# Patient Record
Sex: Female | Born: 1985
Health system: Southern US, Community
[De-identification: ages and names within clinical notes are randomized; demographics above are authoritative.]

## PROBLEM LIST (undated history)

## (undated) DIAGNOSIS — E119 Type 2 diabetes mellitus without complications: Secondary | ICD-10-CM

## (undated) HISTORY — DX: Type 2 diabetes mellitus without complications: E11.9

---
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
## 2005-06-15 ENCOUNTER — Other Ambulatory Visit: Admission: RE | Admit: 2005-06-15 | Discharge: 2005-06-15 | Payer: Self-pay | Admitting: Family Medicine

## 2006-12-08 ENCOUNTER — Other Ambulatory Visit: Admission: RE | Admit: 2006-12-08 | Discharge: 2006-12-08 | Payer: Self-pay | Admitting: Family Medicine

## 2009-05-18 ENCOUNTER — Other Ambulatory Visit: Admission: RE | Admit: 2009-05-18 | Discharge: 2009-05-18 | Payer: Self-pay | Admitting: Family Medicine

## 2009-05-28 ENCOUNTER — Encounter: Admission: RE | Admit: 2009-05-28 | Discharge: 2009-05-28 | Payer: Self-pay | Admitting: Family Medicine

## 2009-11-25 ENCOUNTER — Encounter: Admission: RE | Admit: 2009-11-25 | Discharge: 2009-11-25 | Payer: Self-pay | Admitting: Specialist

## 2010-12-26 IMAGING — CR DG SACRUM/COCCYX 2+V
3 series · 3 of 3 positions shown · non-contrast
Comparison: None

CLINICAL DATA: Fall, pain when sitting.

SACRUM AND COCCYX - 2+ VIEW

[view not recorded (1 of 3)]
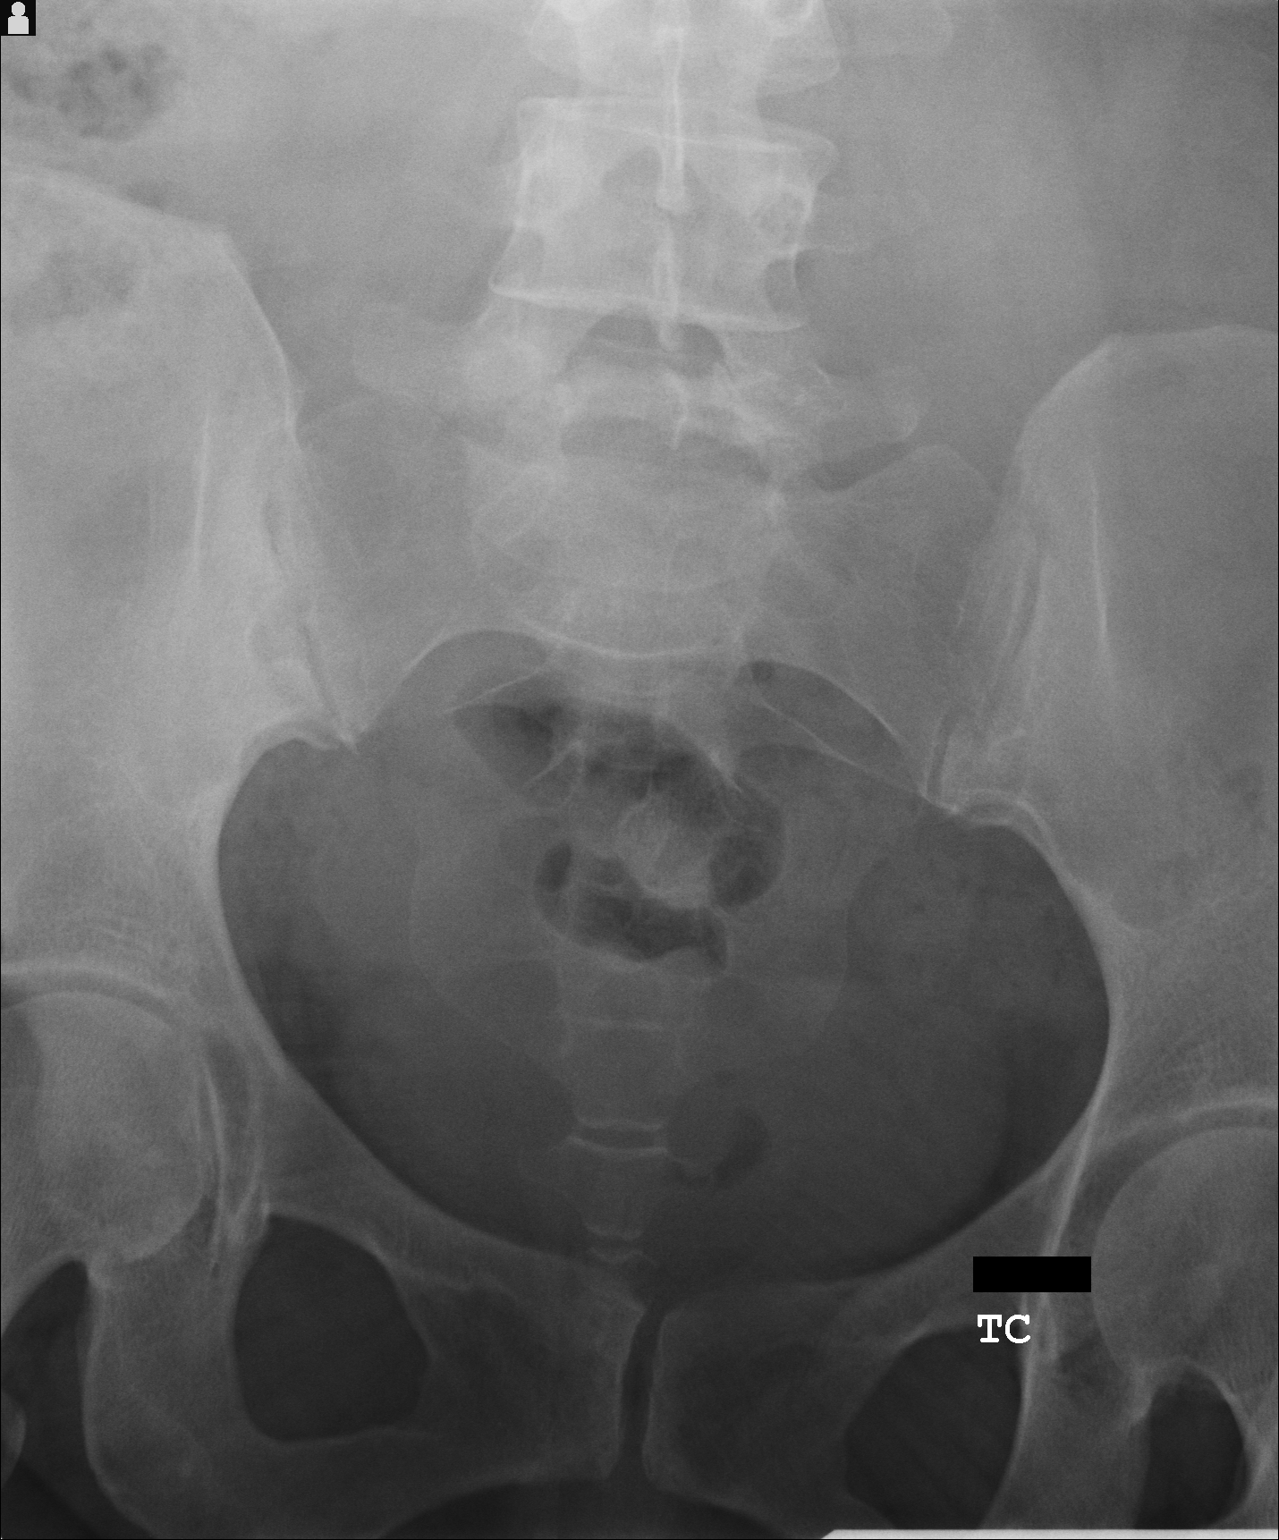

[view not recorded (2 of 3)]
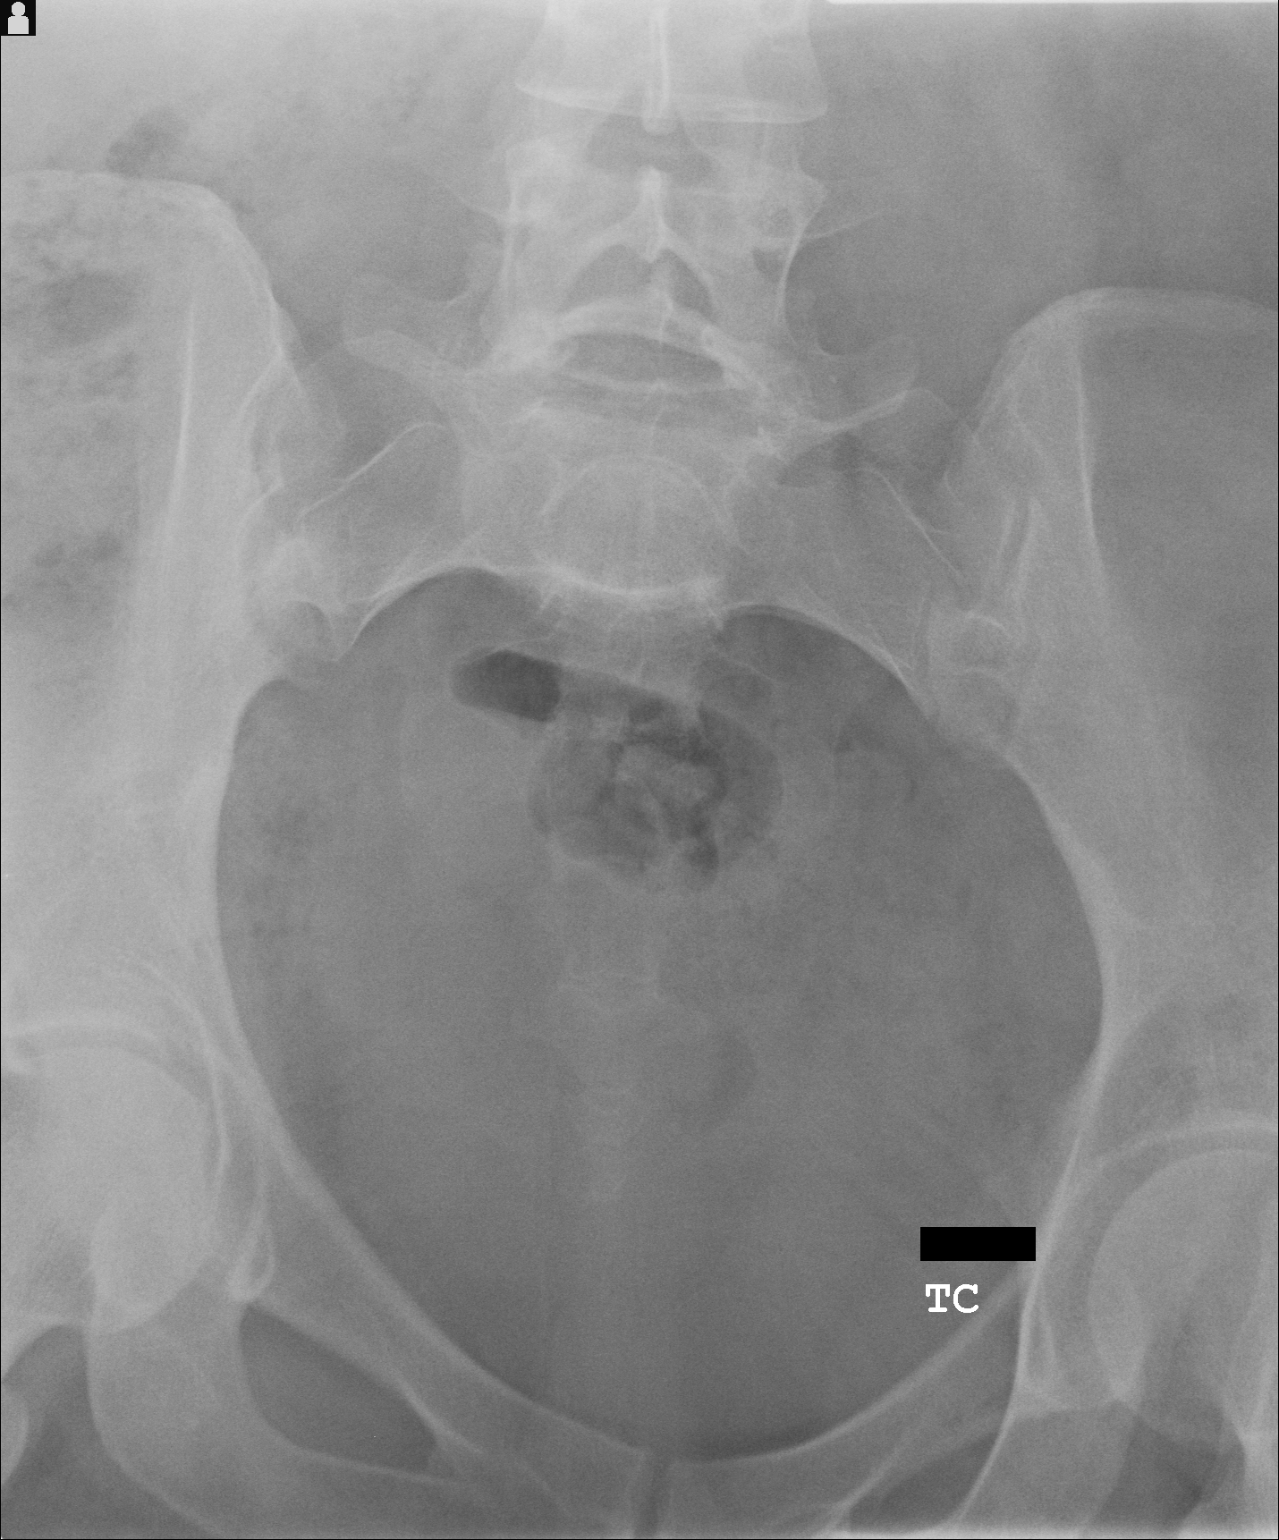

[view not recorded (3 of 3)]
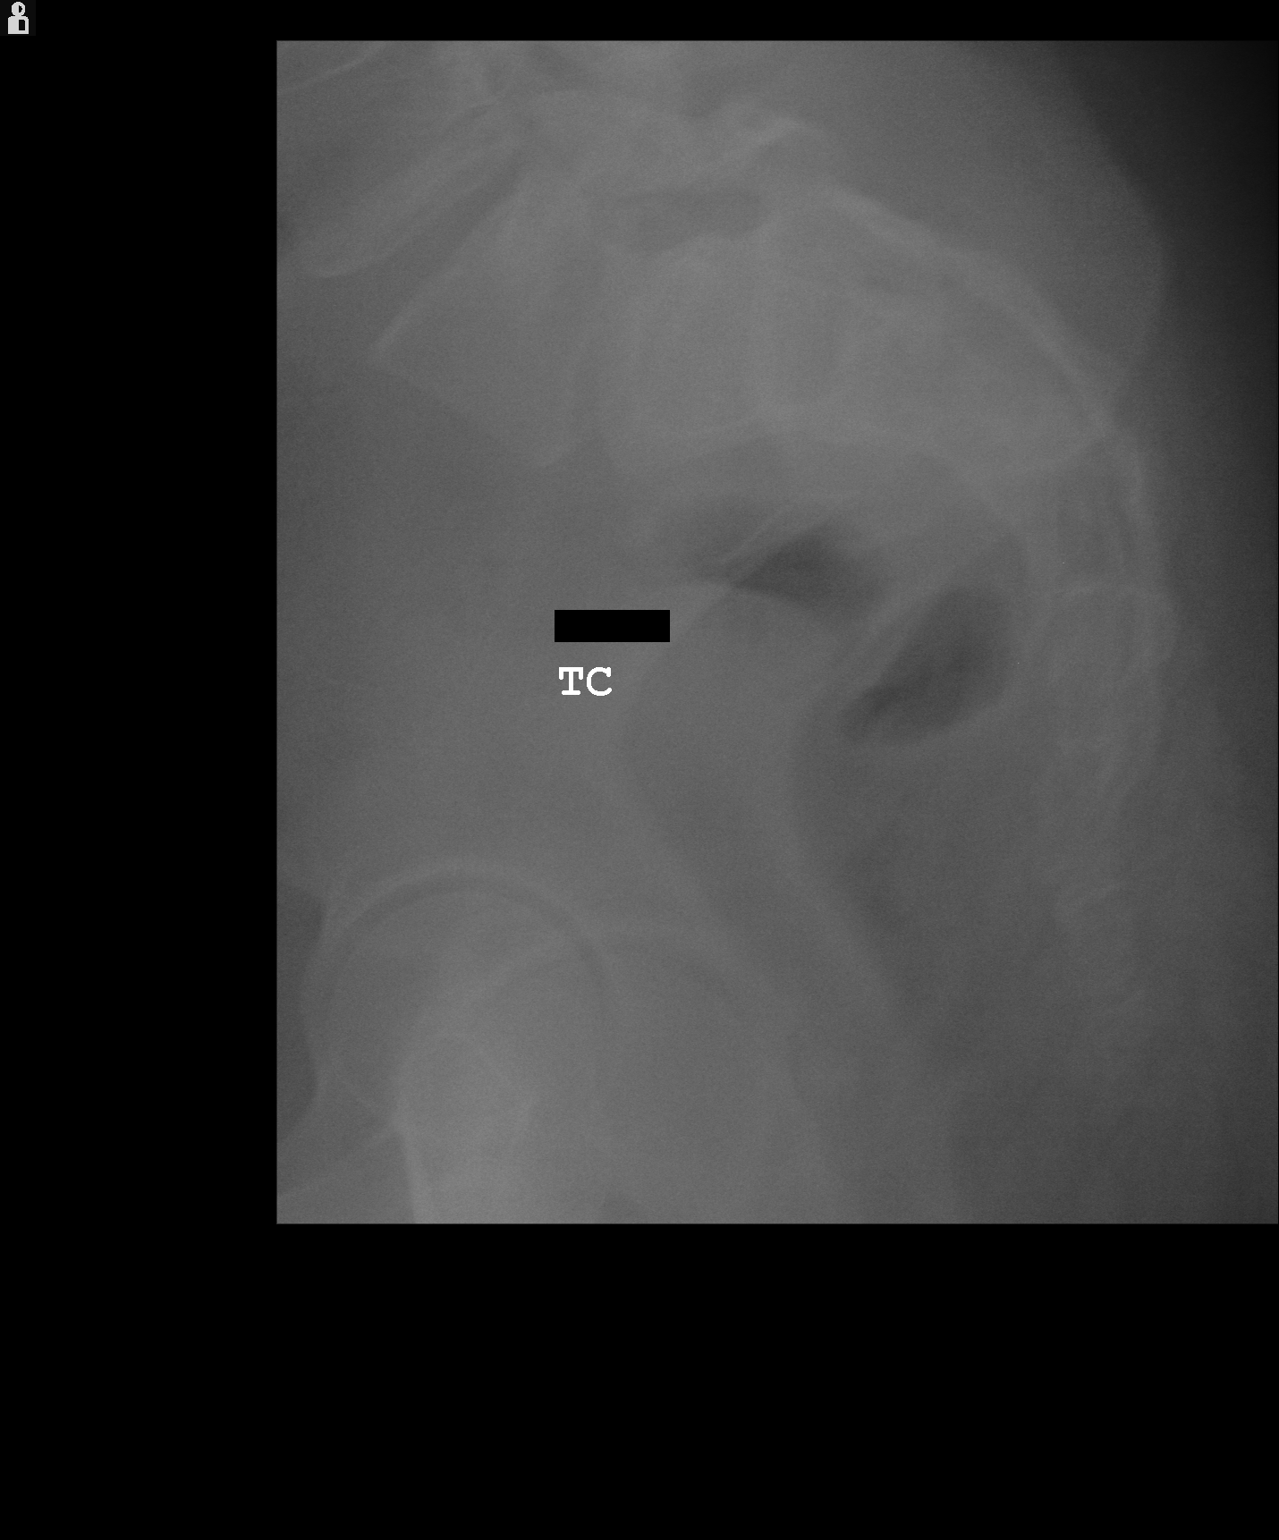

[3 of 3 positions shown; findings below may reference images not displayed]

FINDINGS: No definite sacral or coccygeal fracture identified.
Lower lumbar spine is unremarkable.  SI joints are symmetric.
IMPRESSION: No definite acute bony abnormality.

## 2011-06-02 ENCOUNTER — Encounter: Payer: Self-pay | Admitting: Podiatry

## 2011-09-01 ENCOUNTER — Other Ambulatory Visit: Payer: Self-pay | Admitting: Family Medicine

## 2011-09-01 ENCOUNTER — Other Ambulatory Visit (HOSPITAL_COMMUNITY)
Admission: RE | Admit: 2011-09-01 | Discharge: 2011-09-01 | Disposition: A | Discharge status: Home / Self Care | Payer: Medicaid Other | Source: Ambulatory Visit | Attending: Family Medicine | Admitting: Family Medicine

## 2011-09-01 DIAGNOSIS — Z124 Encounter for screening for malignant neoplasm of cervix: Secondary | ICD-10-CM | POA: Insufficient documentation

## 2011-11-14 ENCOUNTER — Ambulatory Visit: Payer: Medicaid Other | Admitting: Dietician

## 2011-11-15 ENCOUNTER — Ambulatory Visit: Payer: Medicaid Other | Admitting: Dietician

## 2011-12-07 DIAGNOSIS — E7800 Pure hypercholesterolemia, unspecified: Secondary | ICD-10-CM | POA: Diagnosis not present

## 2011-12-07 DIAGNOSIS — R809 Proteinuria, unspecified: Secondary | ICD-10-CM | POA: Diagnosis not present

## 2011-12-07 DIAGNOSIS — E1129 Type 2 diabetes mellitus with other diabetic kidney complication: Secondary | ICD-10-CM | POA: Diagnosis not present

## 2011-12-07 DIAGNOSIS — G809 Cerebral palsy, unspecified: Secondary | ICD-10-CM | POA: Diagnosis not present

## 2011-12-12 DIAGNOSIS — B351 Tinea unguium: Secondary | ICD-10-CM | POA: Diagnosis not present

## 2011-12-12 DIAGNOSIS — M79609 Pain in unspecified limb: Secondary | ICD-10-CM | POA: Diagnosis not present

## 2012-03-07 DIAGNOSIS — E1129 Type 2 diabetes mellitus with other diabetic kidney complication: Secondary | ICD-10-CM | POA: Diagnosis not present

## 2012-03-07 DIAGNOSIS — R809 Proteinuria, unspecified: Secondary | ICD-10-CM | POA: Diagnosis not present

## 2012-03-07 DIAGNOSIS — E7800 Pure hypercholesterolemia, unspecified: Secondary | ICD-10-CM | POA: Diagnosis not present

## 2012-03-12 DIAGNOSIS — B351 Tinea unguium: Secondary | ICD-10-CM | POA: Diagnosis not present

## 2012-03-12 DIAGNOSIS — M79609 Pain in unspecified limb: Secondary | ICD-10-CM | POA: Diagnosis not present

## 2012-04-25 DIAGNOSIS — E119 Type 2 diabetes mellitus without complications: Secondary | ICD-10-CM | POA: Diagnosis not present

## 2012-05-30 DIAGNOSIS — E1129 Type 2 diabetes mellitus with other diabetic kidney complication: Secondary | ICD-10-CM | POA: Diagnosis not present

## 2012-05-30 DIAGNOSIS — R809 Proteinuria, unspecified: Secondary | ICD-10-CM | POA: Diagnosis not present

## 2012-05-30 DIAGNOSIS — E7800 Pure hypercholesterolemia, unspecified: Secondary | ICD-10-CM | POA: Diagnosis not present

## 2012-06-04 DIAGNOSIS — B351 Tinea unguium: Secondary | ICD-10-CM | POA: Diagnosis not present

## 2012-06-04 DIAGNOSIS — E1149 Type 2 diabetes mellitus with other diabetic neurological complication: Secondary | ICD-10-CM | POA: Diagnosis not present

## 2012-07-19 DIAGNOSIS — IMO0001 Reserved for inherently not codable concepts without codable children: Secondary | ICD-10-CM | POA: Diagnosis not present

## 2012-08-27 DIAGNOSIS — IMO0001 Reserved for inherently not codable concepts without codable children: Secondary | ICD-10-CM | POA: Diagnosis not present

## 2012-09-03 DIAGNOSIS — R809 Proteinuria, unspecified: Secondary | ICD-10-CM | POA: Diagnosis not present

## 2012-09-03 DIAGNOSIS — J309 Allergic rhinitis, unspecified: Secondary | ICD-10-CM | POA: Diagnosis not present

## 2012-09-03 DIAGNOSIS — E7800 Pure hypercholesterolemia, unspecified: Secondary | ICD-10-CM | POA: Diagnosis not present

## 2012-09-03 DIAGNOSIS — E1129 Type 2 diabetes mellitus with other diabetic kidney complication: Secondary | ICD-10-CM | POA: Diagnosis not present

## 2012-09-03 DIAGNOSIS — Z23 Encounter for immunization: Secondary | ICD-10-CM | POA: Diagnosis not present

## 2012-09-06 DIAGNOSIS — L84 Corns and callosities: Secondary | ICD-10-CM | POA: Diagnosis not present

## 2012-09-06 DIAGNOSIS — E1149 Type 2 diabetes mellitus with other diabetic neurological complication: Secondary | ICD-10-CM | POA: Diagnosis not present

## 2012-09-06 DIAGNOSIS — B351 Tinea unguium: Secondary | ICD-10-CM | POA: Diagnosis not present

## 2012-11-22 DIAGNOSIS — L84 Corns and callosities: Secondary | ICD-10-CM | POA: Diagnosis not present

## 2012-11-22 DIAGNOSIS — M79609 Pain in unspecified limb: Secondary | ICD-10-CM | POA: Diagnosis not present

## 2012-11-22 DIAGNOSIS — E1149 Type 2 diabetes mellitus with other diabetic neurological complication: Secondary | ICD-10-CM | POA: Diagnosis not present

## 2012-11-22 DIAGNOSIS — B351 Tinea unguium: Secondary | ICD-10-CM | POA: Diagnosis not present

## 2013-01-24 DIAGNOSIS — Z0000 Encounter for general adult medical examination without abnormal findings: Secondary | ICD-10-CM | POA: Diagnosis not present

## 2013-01-24 DIAGNOSIS — G809 Cerebral palsy, unspecified: Secondary | ICD-10-CM | POA: Diagnosis not present

## 2013-01-24 DIAGNOSIS — R809 Proteinuria, unspecified: Secondary | ICD-10-CM | POA: Diagnosis not present

## 2013-01-24 DIAGNOSIS — E1129 Type 2 diabetes mellitus with other diabetic kidney complication: Secondary | ICD-10-CM | POA: Diagnosis not present

## 2013-01-24 DIAGNOSIS — J309 Allergic rhinitis, unspecified: Secondary | ICD-10-CM | POA: Diagnosis not present

## 2013-01-24 DIAGNOSIS — E7800 Pure hypercholesterolemia, unspecified: Secondary | ICD-10-CM | POA: Diagnosis not present

## 2013-02-14 DIAGNOSIS — M79609 Pain in unspecified limb: Secondary | ICD-10-CM | POA: Diagnosis not present

## 2013-02-14 DIAGNOSIS — Q828 Other specified congenital malformations of skin: Secondary | ICD-10-CM | POA: Diagnosis not present

## 2013-02-14 DIAGNOSIS — E1149 Type 2 diabetes mellitus with other diabetic neurological complication: Secondary | ICD-10-CM | POA: Diagnosis not present

## 2013-02-14 DIAGNOSIS — B351 Tinea unguium: Secondary | ICD-10-CM | POA: Diagnosis not present

## 2013-05-09 DIAGNOSIS — M79609 Pain in unspecified limb: Secondary | ICD-10-CM | POA: Diagnosis not present

## 2013-05-09 DIAGNOSIS — E1149 Type 2 diabetes mellitus with other diabetic neurological complication: Secondary | ICD-10-CM | POA: Diagnosis not present

## 2013-05-09 DIAGNOSIS — Q828 Other specified congenital malformations of skin: Secondary | ICD-10-CM | POA: Diagnosis not present

## 2013-05-09 DIAGNOSIS — B351 Tinea unguium: Secondary | ICD-10-CM | POA: Diagnosis not present

## 2013-05-13 DIAGNOSIS — E119 Type 2 diabetes mellitus without complications: Secondary | ICD-10-CM | POA: Diagnosis not present

## 2013-05-27 DIAGNOSIS — G809 Cerebral palsy, unspecified: Secondary | ICD-10-CM | POA: Diagnosis not present

## 2013-05-27 DIAGNOSIS — E1129 Type 2 diabetes mellitus with other diabetic kidney complication: Secondary | ICD-10-CM | POA: Diagnosis not present

## 2013-05-27 DIAGNOSIS — R809 Proteinuria, unspecified: Secondary | ICD-10-CM | POA: Diagnosis not present

## 2013-05-27 DIAGNOSIS — E7800 Pure hypercholesterolemia, unspecified: Secondary | ICD-10-CM | POA: Diagnosis not present

## 2013-08-01 DIAGNOSIS — B351 Tinea unguium: Secondary | ICD-10-CM | POA: Diagnosis not present

## 2013-08-01 DIAGNOSIS — Q828 Other specified congenital malformations of skin: Secondary | ICD-10-CM | POA: Diagnosis not present

## 2013-08-01 DIAGNOSIS — E1149 Type 2 diabetes mellitus with other diabetic neurological complication: Secondary | ICD-10-CM | POA: Diagnosis not present

## 2013-10-21 ENCOUNTER — Ambulatory Visit: Payer: Self-pay | Admitting: Podiatry

## 2013-10-21 ENCOUNTER — Encounter: Payer: Self-pay | Admitting: Podiatry

## 2013-10-21 ENCOUNTER — Ambulatory Visit (INDEPENDENT_AMBULATORY_CARE_PROVIDER_SITE_OTHER): Payer: Medicare Other | Admitting: Podiatry

## 2013-10-21 VITALS — BP 127/81 | HR 88 | Resp 16 | Ht 65.0 in | Wt 259.0 lb

## 2013-10-21 DIAGNOSIS — E1159 Type 2 diabetes mellitus with other circulatory complications: Secondary | ICD-10-CM | POA: Diagnosis not present

## 2013-10-21 DIAGNOSIS — B351 Tinea unguium: Secondary | ICD-10-CM

## 2013-10-21 DIAGNOSIS — M79609 Pain in unspecified limb: Secondary | ICD-10-CM | POA: Diagnosis not present

## 2013-10-21 DIAGNOSIS — Q828 Other specified congenital malformations of skin: Secondary | ICD-10-CM

## 2013-10-23 NOTE — Progress Notes (Signed)
Subjective:     Patient ID: VIOLETA LECOUNT, female   DOB: 04-Nov-1986, 27 y.o.   MRN: [id number]  HPI patient is and at wrist diabetic who also has moderate mental disorder and is unable to take care of her own feet secondary to hand and dystonia and 2 long-term diabetes   Review of Systems     Objective:   Physical Exam  Constitutional: She is oriented to person, place, and time.  Cardiovascular: Intact distal pulses.   Musculoskeletal: Normal range of motion.  Neurological: She is oriented to person, place, and time.  Skin: Skin is dry.   neurological sensation moderately diminished with diminishment of muscle strength and I noted there to be nail disease 1-5 both feet with thickness and discomfort when I pressed. Also noted to have keratotic lesions on the left first metatarsal plantar right first metatarsal plantar with waxy core     Assessment:     At wrist diabetic with patient who can not take care of her own feet with mycotic painful nail infections and porokeratosis both feet    Plan:     Debridement nailbeds 1-5 both feet and debridement plantar calluses porokeratosis 1 both feet with no iatrogenic bleeding noted

## 2013-11-07 DIAGNOSIS — Z23 Encounter for immunization: Secondary | ICD-10-CM | POA: Diagnosis not present

## 2013-11-07 DIAGNOSIS — K219 Gastro-esophageal reflux disease without esophagitis: Secondary | ICD-10-CM | POA: Diagnosis not present

## 2013-11-07 DIAGNOSIS — E1129 Type 2 diabetes mellitus with other diabetic kidney complication: Secondary | ICD-10-CM | POA: Diagnosis not present

## 2013-11-07 DIAGNOSIS — G809 Cerebral palsy, unspecified: Secondary | ICD-10-CM | POA: Diagnosis not present

## 2013-11-07 DIAGNOSIS — E7800 Pure hypercholesterolemia, unspecified: Secondary | ICD-10-CM | POA: Diagnosis not present

## 2013-11-07 DIAGNOSIS — J309 Allergic rhinitis, unspecified: Secondary | ICD-10-CM | POA: Diagnosis not present

## 2013-11-07 DIAGNOSIS — R809 Proteinuria, unspecified: Secondary | ICD-10-CM | POA: Diagnosis not present

## 2014-01-20 ENCOUNTER — Encounter: Payer: Self-pay | Admitting: Podiatry

## 2014-01-20 ENCOUNTER — Ambulatory Visit (INDEPENDENT_AMBULATORY_CARE_PROVIDER_SITE_OTHER): Payer: Medicare Other | Admitting: Podiatry

## 2014-01-20 VITALS — BP 134/76 | HR 97 | Resp 12

## 2014-01-20 DIAGNOSIS — M79609 Pain in unspecified limb: Secondary | ICD-10-CM | POA: Diagnosis not present

## 2014-01-20 DIAGNOSIS — E1159 Type 2 diabetes mellitus with other circulatory complications: Secondary | ICD-10-CM

## 2014-01-20 DIAGNOSIS — Q828 Other specified congenital malformations of skin: Secondary | ICD-10-CM

## 2014-01-20 DIAGNOSIS — B351 Tinea unguium: Secondary | ICD-10-CM

## 2014-01-21 NOTE — Progress Notes (Signed)
Subjective:     Patient ID: Collene LeydenJamey F Ferdinand, female   DOB: [birth year], 28 y.o.   MRN: [id number]  HPI patient is a long-term diabetic in relatively poor health with nail disease 1-5 both feet to get sore and lesions on the big toes by lateral that she can not take care of herself   Review of Systems     Objective:   Physical Exam Neurovascular status unchanged with diminishment of DP PT pulses diminishment of hair growth and mild varicosities noted keratotic lesions on the big toe of both feet and nail disease with thickness incurvation and pain 1-5 both feet    Assessment:     Keratotic portal lesions hallux bilateral and nail disease 1-5 both feet    Plan:     Debridement painful nailbeds 1-5 both feet and debridement of lesions on the big toe of both feet with no bleeding noted

## 2014-03-13 ENCOUNTER — Encounter: Payer: Self-pay | Admitting: Podiatry

## 2014-03-13 ENCOUNTER — Ambulatory Visit (INDEPENDENT_AMBULATORY_CARE_PROVIDER_SITE_OTHER): Payer: Medicare Other | Admitting: Podiatry

## 2014-03-13 VITALS — BP 146/86 | HR 100 | Resp 12

## 2014-03-13 DIAGNOSIS — B351 Tinea unguium: Secondary | ICD-10-CM | POA: Diagnosis not present

## 2014-03-13 DIAGNOSIS — M79609 Pain in unspecified limb: Secondary | ICD-10-CM | POA: Diagnosis not present

## 2014-03-16 NOTE — Progress Notes (Signed)
Subjective:     Patient ID: Danielle LeydenJamey F Aguilar, female   DOB: [birth year], 28 y.o.   MRN: [id number]  HPI patient presents with nail disease 1-5 both feet and complete inability for her to cut due to physical limitations and pain in the beds   Review of Systems     Objective:   Physical Exam Neurovascular status unchanged with patient well oriented and nail disease with brittle discoloration 1-5 of both feet    Assessment:     Mycotic nail infection with pain 1-5 both feet    Plan:     Debridement painful nailbeds 1-5 both feet with no bleeding noted

## 2014-03-27 ENCOUNTER — Ambulatory Visit
Admission: RE | Admit: 2014-03-27 | Discharge: 2014-03-27 | Disposition: A | Discharge status: Home / Self Care | Payer: Medicare Other | Source: Ambulatory Visit | Attending: Family Medicine | Admitting: Family Medicine

## 2014-03-27 ENCOUNTER — Other Ambulatory Visit: Payer: Self-pay | Admitting: Family Medicine

## 2014-03-27 DIAGNOSIS — M25579 Pain in unspecified ankle and joints of unspecified foot: Secondary | ICD-10-CM | POA: Diagnosis not present

## 2014-03-27 DIAGNOSIS — M25571 Pain in right ankle and joints of right foot: Secondary | ICD-10-CM

## 2014-03-27 DIAGNOSIS — M19079 Primary osteoarthritis, unspecified ankle and foot: Secondary | ICD-10-CM | POA: Diagnosis not present

## 2014-03-27 DIAGNOSIS — M7989 Other specified soft tissue disorders: Secondary | ICD-10-CM | POA: Diagnosis not present

## 2014-03-27 DIAGNOSIS — J309 Allergic rhinitis, unspecified: Secondary | ICD-10-CM | POA: Diagnosis not present

## 2014-05-29 ENCOUNTER — Ambulatory Visit: Payer: Medicare Other | Admitting: Podiatry

## 2014-06-02 ENCOUNTER — Ambulatory Visit (INDEPENDENT_AMBULATORY_CARE_PROVIDER_SITE_OTHER): Payer: Medicare Other | Admitting: Podiatry

## 2014-06-02 DIAGNOSIS — R809 Proteinuria, unspecified: Secondary | ICD-10-CM | POA: Diagnosis not present

## 2014-06-02 DIAGNOSIS — Z0000 Encounter for general adult medical examination without abnormal findings: Secondary | ICD-10-CM | POA: Diagnosis not present

## 2014-06-02 DIAGNOSIS — Q828 Other specified congenital malformations of skin: Secondary | ICD-10-CM | POA: Diagnosis not present

## 2014-06-02 DIAGNOSIS — E1159 Type 2 diabetes mellitus with other circulatory complications: Secondary | ICD-10-CM | POA: Diagnosis not present

## 2014-06-02 DIAGNOSIS — E7800 Pure hypercholesterolemia, unspecified: Secondary | ICD-10-CM | POA: Diagnosis not present

## 2014-06-02 DIAGNOSIS — M79673 Pain in unspecified foot: Secondary | ICD-10-CM

## 2014-06-02 DIAGNOSIS — J309 Allergic rhinitis, unspecified: Secondary | ICD-10-CM | POA: Diagnosis not present

## 2014-06-02 DIAGNOSIS — K219 Gastro-esophageal reflux disease without esophagitis: Secondary | ICD-10-CM | POA: Diagnosis not present

## 2014-06-02 DIAGNOSIS — M79609 Pain in unspecified limb: Secondary | ICD-10-CM | POA: Diagnosis not present

## 2014-06-02 DIAGNOSIS — G809 Cerebral palsy, unspecified: Secondary | ICD-10-CM | POA: Diagnosis not present

## 2014-06-02 DIAGNOSIS — E1129 Type 2 diabetes mellitus with other diabetic kidney complication: Secondary | ICD-10-CM | POA: Diagnosis not present

## 2014-06-02 DIAGNOSIS — B351 Tinea unguium: Secondary | ICD-10-CM | POA: Diagnosis not present

## 2014-06-02 NOTE — Progress Notes (Signed)
Subjective:     Patient ID: Danielle Aguilar, female   DOB: 22-Jul-1986, 28 y.o.   MRN: [id number]  HPI patient presents with nail disease of both feet and lesions on the big toe both feet the become painful and she cannot take care of do her health condition and diabetes   Review of Systems     Objective:   Physical Exam Neurovascular status mildly diminished and reviewed with patient along with nail disease and thickness with pain 1-5 both feet and keratotic lesion of the big toes of both feet    Assessment:     At risk diabetic with nail disease 1-5 both feet and lesions on the hallux both feet that are painful    Plan:     Debris lesions and nails of both feet with no iatrogenic bleeding noted and reappoint for routine care

## 2014-06-02 NOTE — Progress Notes (Signed)
   Subjective:    Patient ID: Danielle LeydenJamey F Aguilar, female    DOB: 24-Jul-1986, 28 y.o.   MRN: [id number]  HPI  Pt is here for routine debride  Review of Systems     Objective:   Physical Exam        Assessment & Plan:

## 2014-06-09 DIAGNOSIS — E119 Type 2 diabetes mellitus without complications: Secondary | ICD-10-CM | POA: Diagnosis not present

## 2014-09-15 ENCOUNTER — Ambulatory Visit (INDEPENDENT_AMBULATORY_CARE_PROVIDER_SITE_OTHER): Payer: Medicare Other | Admitting: Podiatry

## 2014-09-15 DIAGNOSIS — E1151 Type 2 diabetes mellitus with diabetic peripheral angiopathy without gangrene: Secondary | ICD-10-CM | POA: Diagnosis not present

## 2014-09-15 DIAGNOSIS — M79673 Pain in unspecified foot: Secondary | ICD-10-CM | POA: Diagnosis not present

## 2014-09-15 DIAGNOSIS — B351 Tinea unguium: Secondary | ICD-10-CM

## 2014-09-15 DIAGNOSIS — Q828 Other specified congenital malformations of skin: Secondary | ICD-10-CM

## 2014-09-15 NOTE — Patient Instructions (Signed)
Diabetes and Foot Care Diabetes may cause you to have problems because of poor blood supply (circulation) to your feet and legs. This may cause the skin on your feet to become thinner, break easier, and heal more slowly. Your skin may become dry, and the skin may peel and crack. You may also have nerve damage in your legs and feet causing decreased feeling in them. You may not notice minor injuries to your feet that could lead to infections or more serious problems. Taking care of your feet is one of the most important things you can do for yourself.  HOME CARE INSTRUCTIONS  Wear shoes at all times, even in the house. Do not go barefoot. Bare feet are easily injured.  Check your feet daily for blisters, cuts, and redness. If you cannot see the bottom of your feet, use a mirror or ask someone for help.  Wash your feet with warm water (do not use hot water) and mild soap. Then pat your feet and the areas between your toes until they are completely dry. Do not soak your feet as this can dry your skin.  Apply a moisturizing lotion or petroleum jelly (that does not contain alcohol and is unscented) to the skin on your feet and to dry, brittle toenails. Do not apply lotion between your toes.  Trim your toenails straight across. Do not dig under them or around the cuticle. File the edges of your nails with an emery board or nail file.  Do not cut corns or calluses or try to remove them with medicine.  Wear clean socks or stockings every day. Make sure they are not too tight. Do not wear knee-high stockings since they may decrease blood flow to your legs.  Wear shoes that fit properly and have enough cushioning. To break in new shoes, wear them for just a few hours a day. This prevents you from injuring your feet. Always look in your shoes before you put them on to be sure there are no objects inside.  Do not cross your legs. This may decrease the blood flow to your feet.  If you find a minor scrape,  cut, or break in the skin on your feet, keep it and the skin around it clean and dry. These areas may be cleansed with mild soap and water. Do not cleanse the area with peroxide, alcohol, or iodine.  When you remove an adhesive bandage, be sure not to damage the skin around it.  If you have a wound, look at it several times a day to make sure it is healing.  Do not use heating pads or hot water bottles. They may burn your skin. If you have lost feeling in your feet or legs, you may not know it is happening until it is too late.  Make sure your health care provider performs a complete foot exam at least annually or more often if you have foot problems. Report any cuts, sores, or bruises to your health care provider immediately. SEEK MEDICAL CARE IF:   You have an injury that is not healing.  You have cuts or breaks in the skin.  You have an ingrown nail.  You notice redness on your legs or feet.  You feel burning or tingling in your legs or feet.  You have pain or cramps in your legs and feet.  Your legs or feet are numb.  Your feet always feel cold. SEEK IMMEDIATE MEDICAL CARE IF:   There is increasing redness,   swelling, or pain in or around a wound.  There is a red line that goes up your leg.  Pus is coming from a wound.  You develop a fever or as directed by your health care provider.  You notice a bad smell coming from an ulcer or wound. Document Released: 11/18/2000 Document Revised: 07/24/2013 Document Reviewed: 04/30/2013 [employer] Patient Information 2015 [employer]. This information is not intended to replace advice given to you by your health care provider. Make sure you discuss any questions you have with your health care provider.  

## 2014-09-15 NOTE — Progress Notes (Signed)
   Subjective:    Patient ID: Danielle Aguilar, female    DOB: 03-May-1986, 28 y.o.   MRN: [id number]  HPI Comments: Patient presents with thick nailbeds and pain 1-5 both feet that he cannot cut.  Review of Systems  Objective:   Physical Exam Neurovascular status intact with thick painful nailbeds 1-5 both feet that are painful.  Assessment:  Mycotic nail infection with pain 1-5 both feet.  Plan:  Debridement painful nailbeds 1-5 both feet with no iatrogenic bleeding noted.   I also noted that there is keratotic lesion on the medial side of the first metatarsal head of both feet that are painful and patient cannot take care of with moderate diminishment of circulatory status and long-term diabetes. Debris did lesions with no iatrogenic bleeding noted    Review of Systems     Objective:   Physical Exam        Assessment & Plan:

## 2014-12-08 DIAGNOSIS — N181 Chronic kidney disease, stage 1: Secondary | ICD-10-CM | POA: Diagnosis not present

## 2014-12-08 DIAGNOSIS — E780 Pure hypercholesterolemia: Secondary | ICD-10-CM | POA: Diagnosis not present

## 2014-12-08 DIAGNOSIS — E1121 Type 2 diabetes mellitus with diabetic nephropathy: Secondary | ICD-10-CM | POA: Diagnosis not present

## 2014-12-08 DIAGNOSIS — Z23 Encounter for immunization: Secondary | ICD-10-CM | POA: Diagnosis not present

## 2014-12-15 ENCOUNTER — Ambulatory Visit (INDEPENDENT_AMBULATORY_CARE_PROVIDER_SITE_OTHER): Payer: Medicare Other | Admitting: Podiatry

## 2014-12-15 DIAGNOSIS — M79673 Pain in unspecified foot: Secondary | ICD-10-CM | POA: Diagnosis not present

## 2014-12-15 DIAGNOSIS — Q828 Other specified congenital malformations of skin: Secondary | ICD-10-CM | POA: Diagnosis not present

## 2014-12-15 DIAGNOSIS — B351 Tinea unguium: Secondary | ICD-10-CM

## 2014-12-15 DIAGNOSIS — E1151 Type 2 diabetes mellitus with diabetic peripheral angiopathy without gangrene: Secondary | ICD-10-CM | POA: Diagnosis not present

## 2014-12-15 NOTE — Progress Notes (Signed)
   Subjective:    Patient ID: Danielle Aguilar, female    DOB: 03/14/1986, 29 y.o.   MRN: [id number]  HPI Comments: Patient presents with thick nailbeds and pain 1-5 both feet that he cannot cut.  Review of Systems  Objective:   Physical Exam Neurovascular status intact with thick painful nailbeds 1-5 both feet that are painful.  Assessment:  Mycotic nail infection with pain 1-5 both feet.  Plan:  Debridement painful nailbeds 1-5 both feet with no iatrogenic bleeding noted.   I also noted that there is keratotic lesion on the medial side of the first metatarsal head of both feet that are painful and patient cannot take care of with moderate diminishment of circulatory status and long-term diabetes. Debris did lesions with no iatrogenic bleeding noted    Review of Systems     Objective:   Physical Exam        Assessment & Plan:   

## 2015-03-23 ENCOUNTER — Ambulatory Visit (INDEPENDENT_AMBULATORY_CARE_PROVIDER_SITE_OTHER): Payer: Medicare Other | Admitting: Podiatry

## 2015-03-23 DIAGNOSIS — E1151 Type 2 diabetes mellitus with diabetic peripheral angiopathy without gangrene: Secondary | ICD-10-CM

## 2015-03-23 DIAGNOSIS — M79673 Pain in unspecified foot: Secondary | ICD-10-CM

## 2015-03-23 DIAGNOSIS — B351 Tinea unguium: Secondary | ICD-10-CM | POA: Diagnosis not present

## 2015-03-23 DIAGNOSIS — Q828 Other specified congenital malformations of skin: Secondary | ICD-10-CM | POA: Diagnosis not present

## 2015-03-23 NOTE — Progress Notes (Signed)
   Subjective:    Patient ID: Danielle LeydenJamey F Aguilar, female    DOB: 06/17/1986, 29 y.o.   MRN: [id number]  HPI Comments: Patient presents with thick nailbeds and pain 1-5 both feet that he cannot cut. And also states that these lesions on the sides of her big toes are hard for her to take care of and she does have long-term diabetes  Review of Systems  Objective:   Physical Exam Neurovascular status intact with thick painful nailbeds 1-5 both feet that are painful.  Assessment:  Mycotic nail infection with pain 1-5 both feet. At risk diabetes also noted with diminished neurological status and keratotic lesions on the side of the big toe bilateral that are painful and also mild diminishment of circulatory status  Plan:  Debridement painful nailbeds 1-5 both feet with no iatrogenic bleeding noted.   I also noted that there is keratotic lesion on the medial side of the first metatarsal head of both feet that are painful and patient cannot take care of with moderate diminishment of circulatory status and long-term diabetes. Debris did lesions with no iatrogenic bleeding noted    Review of Systems     Objective:   Physical Exam        Assessment & Plan:

## 2015-04-27 IMAGING — CR DG ANKLE COMPLETE 3+V*R*
3 series · 3 of 3 positions shown · non-contrast
Comparison: None.

CLINICAL DATA: 27-year-old female with ankle pain and swelling.

EXAM:
RIGHT ANKLE - COMPLETE 3+ VIEW

[view not recorded (1 of 3)]
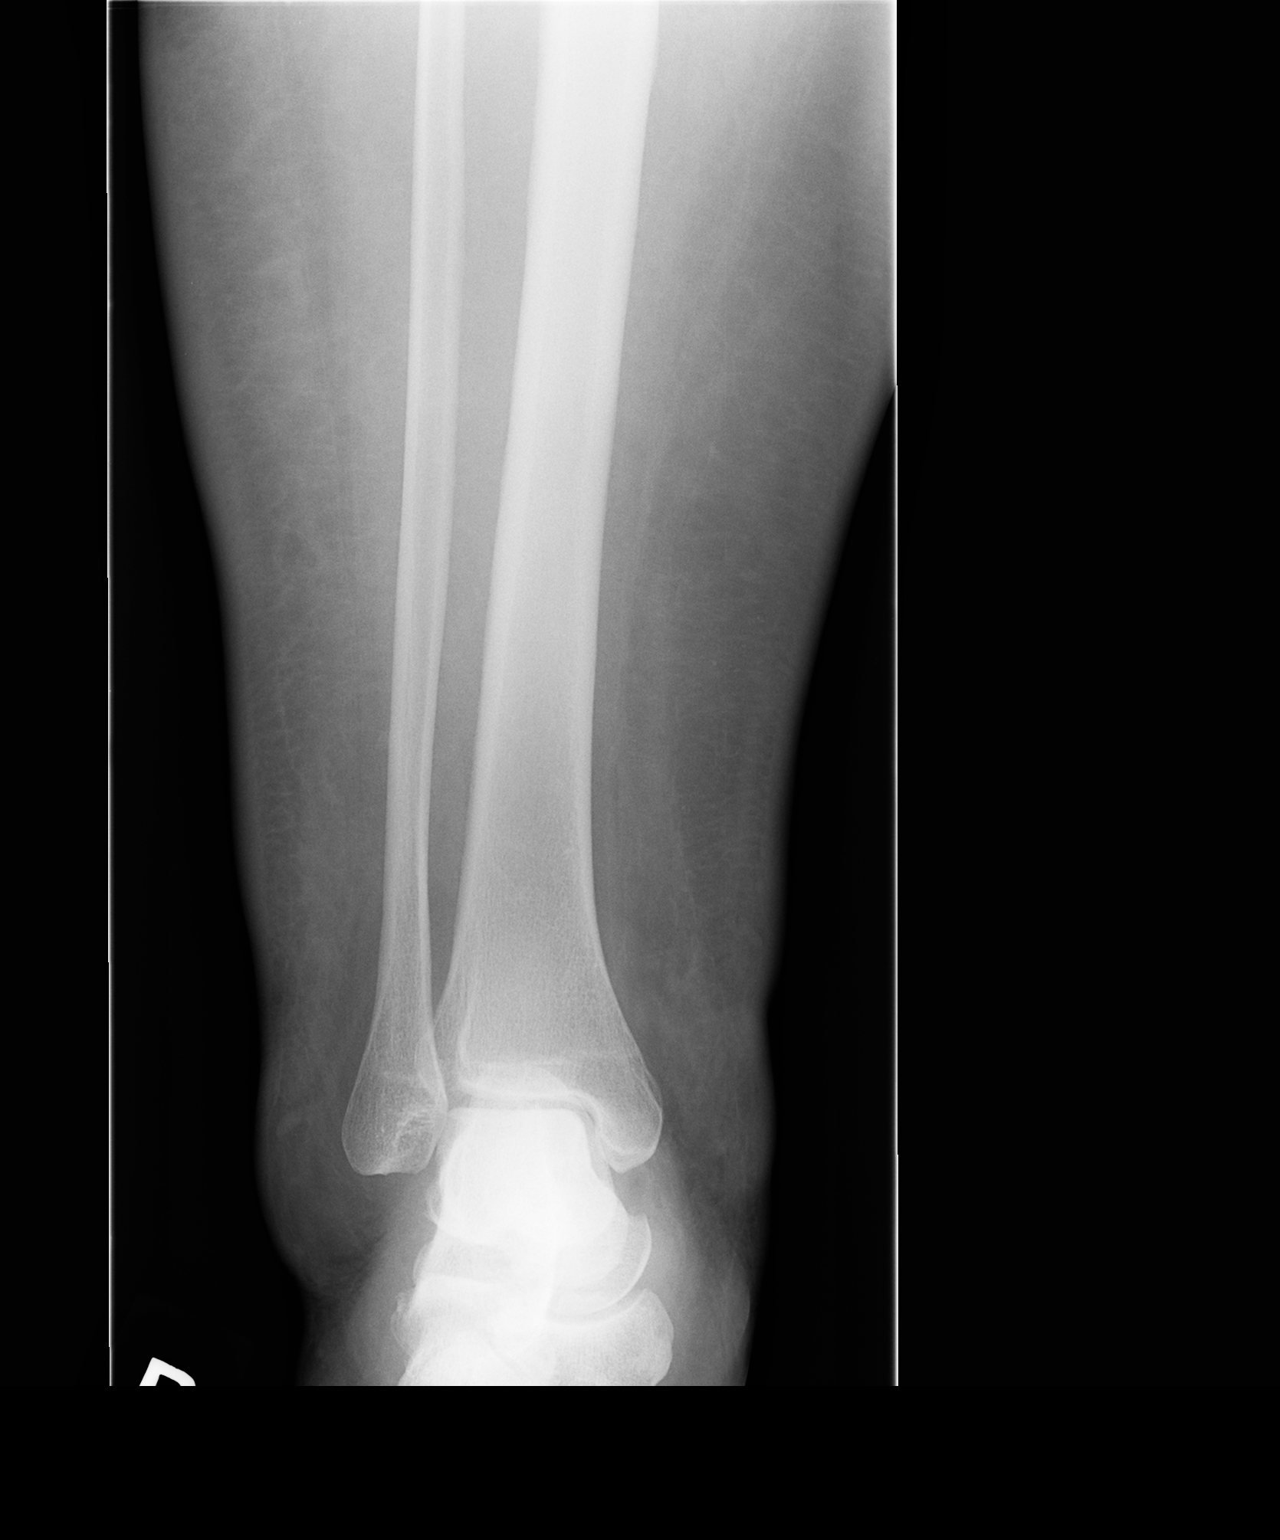

[view not recorded (2 of 3)]
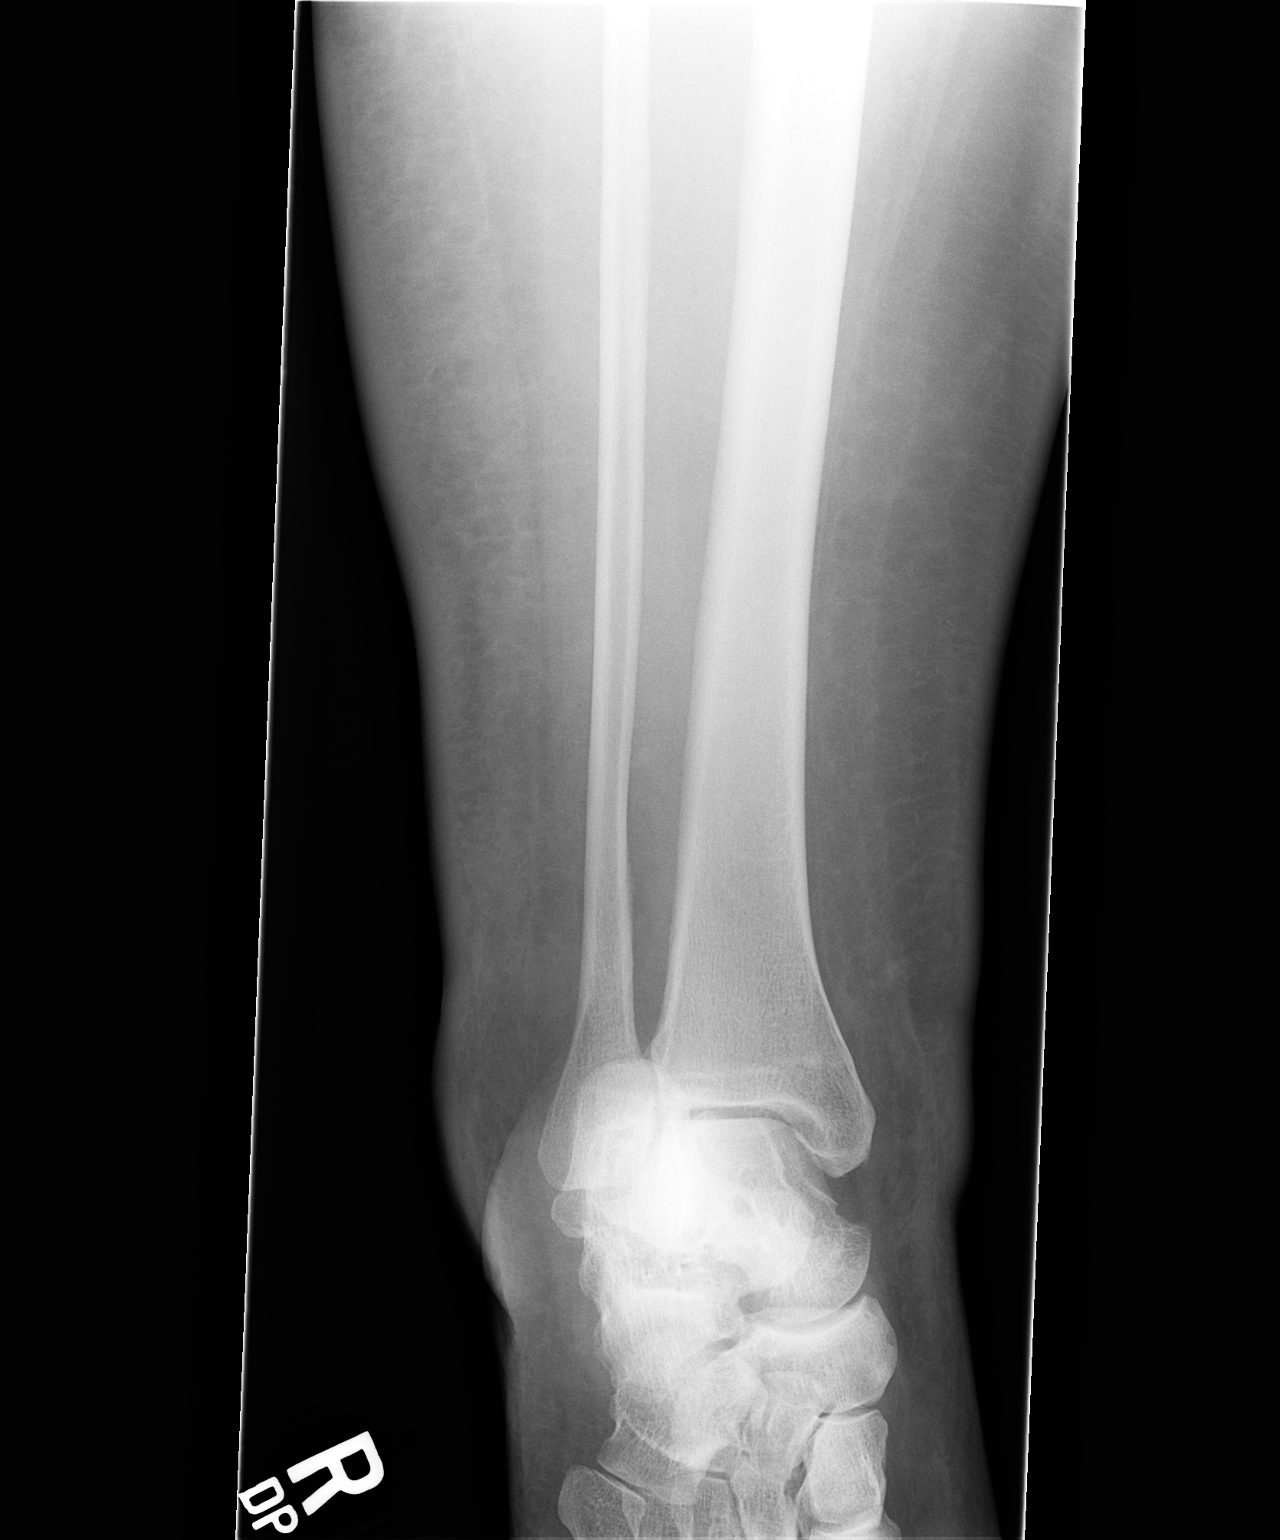

[view not recorded (3 of 3)]
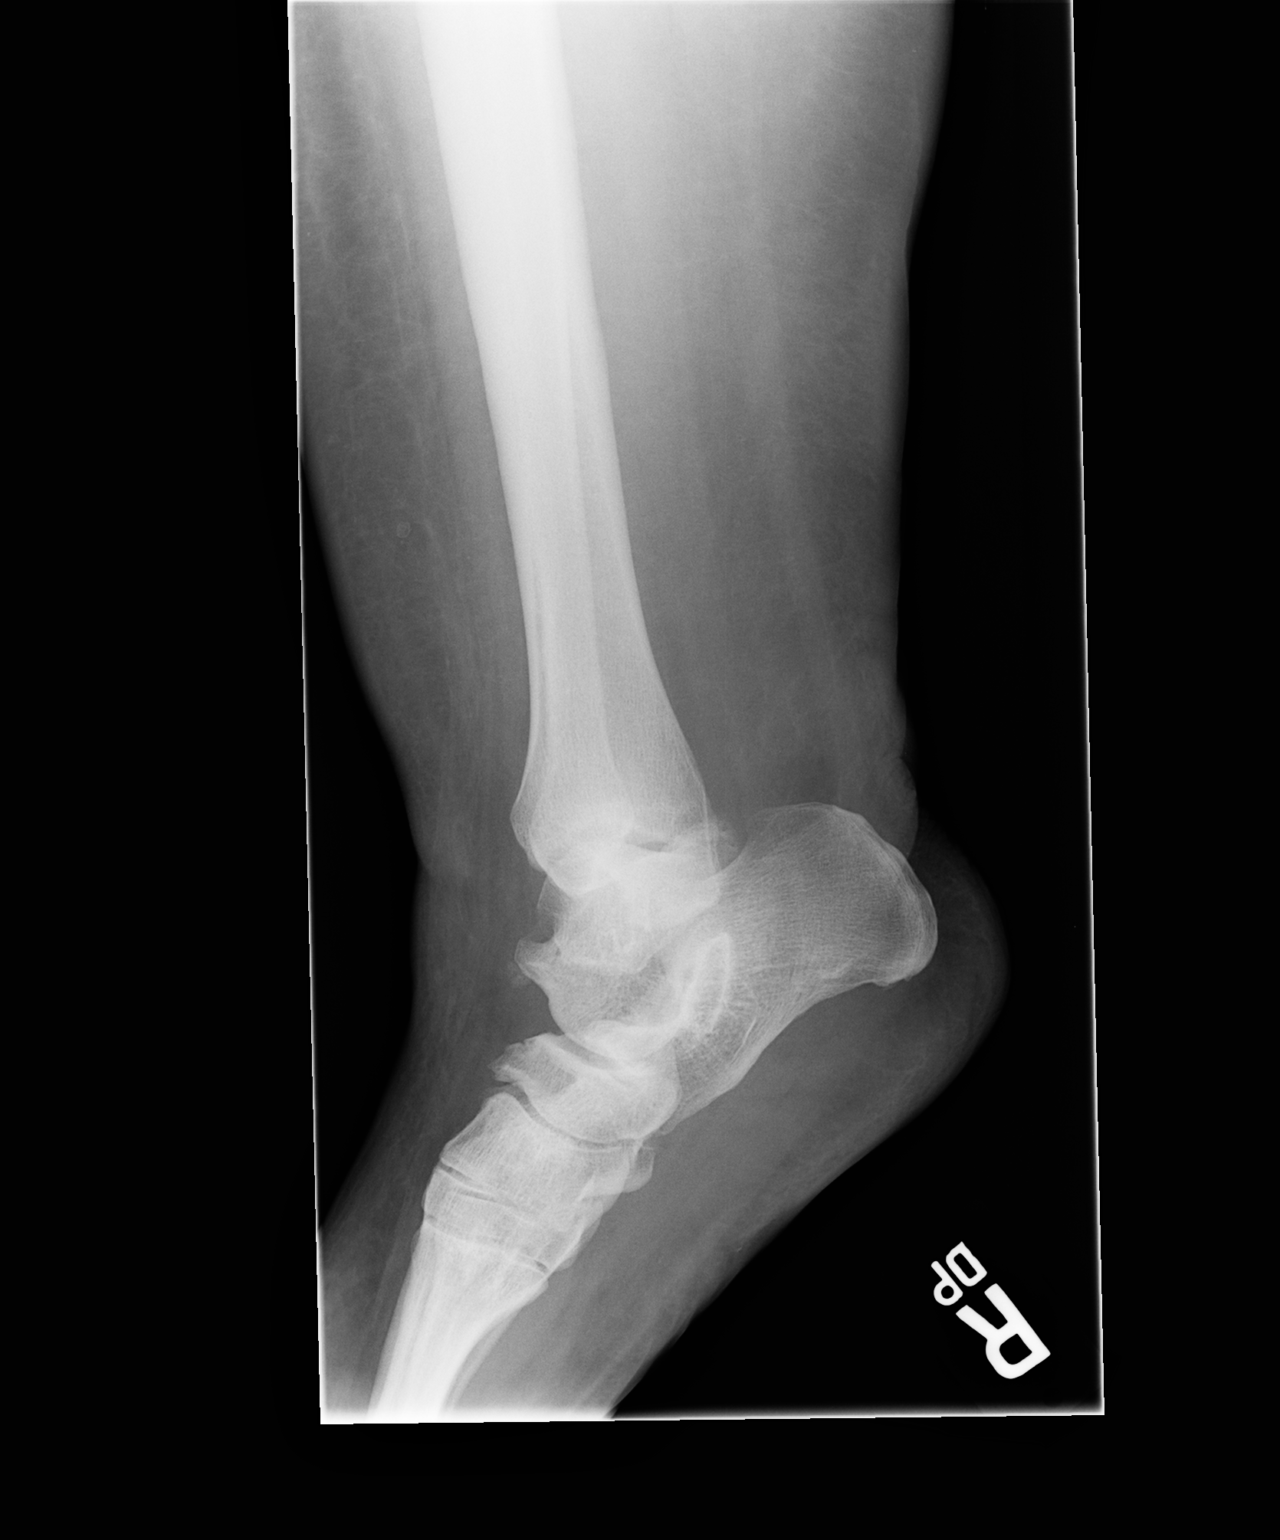

[3 of 3 positions shown; findings below may reference images not displayed]

FINDINGS: Soft tissue swelling identified.

There is no evidence of fracture, subluxation or dislocation.

No focal bony lesions are present.

Midfoot degenerative changes are noted.
IMPRESSION: Soft tissue swelling without acute bony abnormality.

Midfoot degenerative changes.

## 2015-07-13 ENCOUNTER — Ambulatory Visit: Payer: Medicare Other

## 2015-07-15 ENCOUNTER — Ambulatory Visit: Payer: Medicare Other | Admitting: Podiatry

## 2015-07-20 DIAGNOSIS — N181 Chronic kidney disease, stage 1: Secondary | ICD-10-CM | POA: Diagnosis not present

## 2015-07-20 DIAGNOSIS — Z794 Long term (current) use of insulin: Secondary | ICD-10-CM | POA: Diagnosis not present

## 2015-07-20 DIAGNOSIS — K219 Gastro-esophageal reflux disease without esophagitis: Secondary | ICD-10-CM | POA: Diagnosis not present

## 2015-07-20 DIAGNOSIS — G809 Cerebral palsy, unspecified: Secondary | ICD-10-CM | POA: Diagnosis not present

## 2015-07-20 DIAGNOSIS — E780 Pure hypercholesterolemia: Secondary | ICD-10-CM | POA: Diagnosis not present

## 2015-07-20 DIAGNOSIS — Z1389 Encounter for screening for other disorder: Secondary | ICD-10-CM | POA: Diagnosis not present

## 2015-07-20 DIAGNOSIS — E1121 Type 2 diabetes mellitus with diabetic nephropathy: Secondary | ICD-10-CM | POA: Diagnosis not present

## 2015-07-20 DIAGNOSIS — J309 Allergic rhinitis, unspecified: Secondary | ICD-10-CM | POA: Diagnosis not present

## 2015-07-20 DIAGNOSIS — Z0000 Encounter for general adult medical examination without abnormal findings: Secondary | ICD-10-CM | POA: Diagnosis not present

## 2015-07-22 ENCOUNTER — Ambulatory Visit (INDEPENDENT_AMBULATORY_CARE_PROVIDER_SITE_OTHER): Payer: Medicare Other | Admitting: Podiatry

## 2015-07-22 ENCOUNTER — Encounter: Payer: Self-pay | Admitting: Podiatry

## 2015-07-22 DIAGNOSIS — M79676 Pain in unspecified toe(s): Secondary | ICD-10-CM

## 2015-07-22 DIAGNOSIS — B351 Tinea unguium: Secondary | ICD-10-CM | POA: Diagnosis not present

## 2015-07-22 NOTE — Patient Instructions (Signed)
Diabetes and Foot Care Diabetes may cause you to have problems because of poor blood supply (circulation) to your feet and legs. This may cause the skin on your feet to become thinner, break easier, and heal more slowly. Your skin may become dry, and the skin may peel and crack. You may also have nerve damage in your legs and feet causing decreased feeling in them. You may not notice minor injuries to your feet that could lead to infections or more serious problems. Taking care of your feet is one of the most important things you can do for yourself.  HOME CARE INSTRUCTIONS  Wear shoes at all times, even in the house. Do not go barefoot. Bare feet are easily injured.  Check your feet daily for blisters, cuts, and redness. If you cannot see the bottom of your feet, use a mirror or ask someone for help.  Wash your feet with warm water (do not use hot water) and mild soap. Then pat your feet and the areas between your toes until they are completely dry. Do not soak your feet as this can dry your skin.  Apply a moisturizing lotion or petroleum jelly (that does not contain alcohol and is unscented) to the skin on your feet and to dry, brittle toenails. Do not apply lotion between your toes.  Trim your toenails straight across. Do not dig under them or around the cuticle. File the edges of your nails with an emery board or nail file.  Do not cut corns or calluses or try to remove them with medicine.  Wear clean socks or stockings every day. Make sure they are not too tight. Do not wear knee-high stockings since they may decrease blood flow to your legs.  Wear shoes that fit properly and have enough cushioning. To break in new shoes, wear them for just a few hours a day. This prevents you from injuring your feet. Always look in your shoes before you put them on to be sure there are no objects inside.  Do not cross your legs. This may decrease the blood flow to your feet.  If you find a minor scrape,  cut, or break in the skin on your feet, keep it and the skin around it clean and dry. These areas may be cleansed with mild soap and water. Do not cleanse the area with peroxide, alcohol, or iodine.  When you remove an adhesive bandage, be sure not to damage the skin around it.  If you have a wound, look at it several times a day to make sure it is healing.  Do not use heating pads or hot water bottles. They may burn your skin. If you have lost feeling in your feet or legs, you may not know it is happening until it is too late.  Make sure your health care provider performs a complete foot exam at least annually or more often if you have foot problems. Report any cuts, sores, or bruises to your health care provider immediately. SEEK MEDICAL CARE IF:   You have an injury that is not healing.  You have cuts or breaks in the skin.  You have an ingrown nail.  You notice redness on your legs or feet.  You feel burning or tingling in your legs or feet.  You have pain or cramps in your legs and feet.  Your legs or feet are numb.  Your feet always feel cold. SEEK IMMEDIATE MEDICAL CARE IF:   There is increasing redness,   swelling, or pain in or around a wound.  There is a red line that goes up your leg.  Pus is coming from a wound.  You develop a fever or as directed by your health care provider.  You notice a bad smell coming from an ulcer or wound. Document Released: 11/18/2000 Document Revised: 07/24/2013 Document Reviewed: 04/30/2013 [employer] Patient Information 2015 [employer]. This information is not intended to replace advice given to you by your health care provider. Make sure you discuss any questions you have with your health care provider.  

## 2015-07-23 NOTE — Progress Notes (Signed)
Patient ID: Danielle Aguilar, female   DOB: 06/19/1986, 29 y.o.   MRN: [id number]  Subjective: This patient presents for scheduled visit complaining of painful toenails and requests nail debridement.  Objective: Vascular: Nonpitting peripheral edema bilaterally DP and PT pulses 2/4 bilaterally  Neurological: Sensation to 10 g monofilament wire intact 5/5 bilaterally Vibratory sensation intact bilaterally Ankle reflexes weakly reactive bilaterally  Dermatological: The toenails are elongated, brittle, incurvated, discolored and tender to direct palpation 6-10  Musculoskeletal: Pes planus bilaterally Spastic upper and lower motor extremity motions  Assessment: Diabetic without complications History of cerebral palsy Symptomatic onychomycoses 6-10  Plan: Debridement of toenails 10 mechanically and electrically without any bleeding  Reappoint at three-month intervals 

## 2015-07-27 DIAGNOSIS — E119 Type 2 diabetes mellitus without complications: Secondary | ICD-10-CM | POA: Diagnosis not present

## 2015-09-28 DIAGNOSIS — Z23 Encounter for immunization: Secondary | ICD-10-CM | POA: Diagnosis not present

## 2015-10-21 ENCOUNTER — Ambulatory Visit (INDEPENDENT_AMBULATORY_CARE_PROVIDER_SITE_OTHER): Payer: Medicare Other | Admitting: Podiatry

## 2015-10-21 ENCOUNTER — Encounter: Payer: Self-pay | Admitting: Podiatry

## 2015-10-21 DIAGNOSIS — M79676 Pain in unspecified toe(s): Secondary | ICD-10-CM | POA: Diagnosis not present

## 2015-10-21 DIAGNOSIS — B351 Tinea unguium: Secondary | ICD-10-CM

## 2015-10-21 NOTE — Patient Instructions (Signed)
The fifth right toenail appears to be injured and I trim Goff the loose portion. Removed Band-Aid in 24 hours and apply topical antibiotic ointment daily until a scab forms. Cover with Band-Aid or wear white cotton sock  Diabetes and Foot Care Diabetes may cause you to have problems because of poor blood supply (circulation) to your feet and legs. This may cause the skin on your feet to become thinner, break easier, and heal more slowly. Your skin may become dry, and the skin may peel and crack. You may also have nerve damage in your legs and feet causing decreased feeling in them. You may not notice minor injuries to your feet that could lead to infections or more serious problems. Taking care of your feet is one of the most important things you can do for yourself.  HOME CARE INSTRUCTIONS  Wear shoes at all times, even in the house. Do not go barefoot. Bare feet are easily injured.  Check your feet daily for blisters, cuts, and redness. If you cannot see the bottom of your feet, use a mirror or ask someone for help.  Wash your feet with warm water (do not use hot water) and mild soap. Then pat your feet and the areas between your toes until they are completely dry. Do not soak your feet as this can dry your skin.  Apply a moisturizing lotion or petroleum jelly (that does not contain alcohol and is unscented) to the skin on your feet and to dry, brittle toenails. Do not apply lotion between your toes.  Trim your toenails straight across. Do not dig under them or around the cuticle. File the edges of your nails with an emery board or nail file.  Do not cut corns or calluses or try to remove them with medicine.  Wear clean socks or stockings every day. Make sure they are not too tight. Do not wear knee-high stockings since they may decrease blood flow to your legs.  Wear shoes that fit properly and have enough cushioning. To break in new shoes, wear them for just a few hours a day. This  prevents you from injuring your feet. Always look in your shoes before you put them on to be sure there are no objects inside.  Do not cross your legs. This may decrease the blood flow to your feet.  If you find a minor scrape, cut, or break in the skin on your feet, keep it and the skin around it clean and dry. These areas may be cleansed with mild soap and water. Do not cleanse the area with peroxide, alcohol, or iodine.  When you remove an adhesive bandage, be sure not to damage the skin around it.  If you have a wound, look at it several times a day to make sure it is healing.  Do not use heating pads or hot water bottles. They may burn your skin. If you have lost feeling in your feet or legs, you may not know it is happening until it is too late.  Make sure your health care provider performs a complete foot exam at least annually or more often if you have foot problems. Report any cuts, sores, or bruises to your health care provider immediately. SEEK MEDICAL CARE IF:   You have an injury that is not healing.  You have cuts or breaks in the skin.  You have an ingrown nail.  You notice redness on your legs or feet.  You feel burning or tingling  in your legs or feet.  You have pain or cramps in your legs and feet.  Your legs or feet are numb.  Your feet always feel cold. SEEK IMMEDIATE MEDICAL CARE IF:   There is increasing redness, swelling, or pain in or around a wound.  There is a red line that goes up your leg.  Pus is coming from a wound.  You develop a fever or as directed by your health care provider.  You notice a bad smell coming from an ulcer or wound.   This information is not intended to replace advice given to you by your health care provider. Make sure you discuss any questions you have with your health care provider.   Document Released: 11/18/2000 Document Revised: 07/24/2013 Document Reviewed: 04/30/2013 Elsevier Interactive Patient Education [employer]

## 2015-10-22 NOTE — Progress Notes (Signed)
Patient ID: Danielle LeydenJamey F Aguilar, female   DOB: 1986-07-19, 29 y.o.   MRN: [id number]  Subjective: Patient presents today complaining of painful toenails and walking wearing shoes and requests nail debridement. Patient relates a history of trauma the fifth right toenail which losing the nail causing partial traumatic avulsion  Objective: The fifth nailbed is drying crusted Remaining toenails 9 are brittle, rated, deformed, discolored and tender direct palpation 6-10  Assessment: Partial traumatic nail avulsion fifth right nail without clinical sign of infection Mycotic toenails 9 CPT Diabetic without complication  Plan: Debridement toenails 9 mechanically and electrically without any bleeding Debrided remaining loose portion of fifth right nail Apply topical antibiotic ointment fifth right nailbed daily and cover with a Band-Aid until a scab forms  Reappoint 3 months

## 2016-01-25 DIAGNOSIS — Z7984 Long term (current) use of oral hypoglycemic drugs: Secondary | ICD-10-CM | POA: Diagnosis not present

## 2016-01-25 DIAGNOSIS — E1121 Type 2 diabetes mellitus with diabetic nephropathy: Secondary | ICD-10-CM | POA: Diagnosis not present

## 2016-01-25 DIAGNOSIS — E7800 Pure hypercholesterolemia, unspecified: Secondary | ICD-10-CM | POA: Diagnosis not present

## 2016-01-25 DIAGNOSIS — N181 Chronic kidney disease, stage 1: Secondary | ICD-10-CM | POA: Diagnosis not present

## 2016-01-27 ENCOUNTER — Ambulatory Visit: Payer: Medicare Other | Admitting: Podiatry

## 2016-02-17 ENCOUNTER — Ambulatory Visit (INDEPENDENT_AMBULATORY_CARE_PROVIDER_SITE_OTHER): Payer: Medicare Other | Admitting: Podiatry

## 2016-02-17 ENCOUNTER — Encounter: Payer: Self-pay | Admitting: Podiatry

## 2016-02-17 DIAGNOSIS — B351 Tinea unguium: Secondary | ICD-10-CM | POA: Diagnosis not present

## 2016-02-17 DIAGNOSIS — M79676 Pain in unspecified toe(s): Secondary | ICD-10-CM

## 2016-02-17 NOTE — Progress Notes (Signed)
Patient ID: Danielle Aguilar, female   DOB: 07-30-1986, 30 y.o.   MRN: [id number]  Subjective: This patient presents for scheduled visit complaining of painful toenails and requests nail debridement.  Objective: Vascular: Nonpitting peripheral edema bilaterally DP and PT pulses 2/4 bilaterally  Neurological: Sensation to 10 g monofilament wire intact 5/5 bilaterally Vibratory sensation intact bilaterally Ankle reflexes weakly reactive bilaterally  Dermatological: The toenails are elongated, brittle, incurvated, discolored and tender to direct palpation 6-10  Musculoskeletal: Pes planus bilaterally Spastic upper and lower motor extremity motions  Assessment: Diabetic without complications History of cerebral palsy Symptomatic onychomycoses 6-10  Plan: Debridement of toenails 10 mechanically and electrically without any bleeding  Reappoint at three-month intervals

## 2016-03-21 DIAGNOSIS — Z7984 Long term (current) use of oral hypoglycemic drugs: Secondary | ICD-10-CM | POA: Diagnosis not present

## 2016-03-21 DIAGNOSIS — Z794 Long term (current) use of insulin: Secondary | ICD-10-CM | POA: Diagnosis not present

## 2016-03-21 DIAGNOSIS — E1121 Type 2 diabetes mellitus with diabetic nephropathy: Secondary | ICD-10-CM | POA: Diagnosis not present

## 2016-05-25 ENCOUNTER — Ambulatory Visit (INDEPENDENT_AMBULATORY_CARE_PROVIDER_SITE_OTHER): Payer: Medicare Other | Admitting: Podiatry

## 2016-05-25 ENCOUNTER — Encounter: Payer: Self-pay | Admitting: Podiatry

## 2016-05-25 DIAGNOSIS — M79676 Pain in unspecified toe(s): Secondary | ICD-10-CM | POA: Diagnosis not present

## 2016-05-25 DIAGNOSIS — B351 Tinea unguium: Secondary | ICD-10-CM

## 2016-05-25 NOTE — Progress Notes (Signed)
Patient ID: Danielle Aguilar, female   DOB: 1986/01/08, 30 y.o.   MRN: [id number]  Subjective: This patient presents for scheduled visit complaining of painful toenails and requests nail debridement. She also is complaining of itching on her right left toes on off weightbearing causing her to scratch  Objective: Vascular: Nonpitting peripheral edema bilaterally DP and PT pulses 2/4 bilaterally  Neurological: Sensation to 10 g monofilament wire intact 5/5 bilaterally Vibratory sensation intact bilaterally Ankle reflexes weakly reactive bilaterally  Dermatological: The toenails are elongated, brittle, incurvated, discolored and tender to direct palpation 6-10 Some areas of excoriation on the borders of the right and left toes with some dry scaling skin  Musculoskeletal: Pes planus bilaterally Spastic upper and lower motor extremity motions  Assessment: Diabetic without complications History of cerebral palsy Symptomatic onychomycoses 6-10 Tinea pedis versus dermatitis toe areas bilaterally  Plan: Debridement of toenails 10 mechanically and electrically without any bleeding Rx over-the-counter antifungal cream Chlortrimazole 1% cream apply once daily 30 days If no improvementafter 30 days Rx over-the-counter 1% hydrocortisone cream to the itching areas  Reappoint 3 months

## 2016-05-25 NOTE — Patient Instructions (Signed)
Purchase over-the-counter clotrimazole1% cream and rub into toes once daily 30 days If the itching does not respond to the clotrimazole cream purchase over-the-counter 1% hydrocortisone cream and apply 1-2 times daily until reaching ends  Diabetes and Foot Care Diabetes may cause you to have problems because of poor blood supply (circulation) to your feet and legs. This may cause the skin on your feet to become thinner, break easier, and heal more slowly. Your skin may become dry, and the skin may peel and crack. You may also have nerve damage in your legs and feet causing decreased feeling in them. You may not notice minor injuries to your feet that could lead to infections or more serious problems. Taking care of your feet is one of the most important things you can do for yourself.  HOME CARE INSTRUCTIONS  Wear shoes at all times, even in the house. Do not go barefoot. Bare feet are easily injured.  Check your feet daily for blisters, cuts, and redness. If you cannot see the bottom of your feet, use a mirror or ask someone for help.  Wash your feet with warm water (do not use hot water) and mild soap. Then pat your feet and the areas between your toes until they are completely dry. Do not soak your feet as this can dry your skin.  Apply a moisturizing lotion or petroleum jelly (that does not contain alcohol and is unscented) to the skin on your feet and to dry, brittle toenails. Do not apply lotion between your toes.  Trim your toenails straight across. Do not dig under them or around the cuticle. File the edges of your nails with an emery board or nail file.  Do not cut corns or calluses or try to remove them with medicine.  Wear clean socks or stockings every day. Make sure they are not too tight. Do not wear knee-high stockings since they may decrease blood flow to your legs.  Wear shoes that fit properly and have enough cushioning. To break in new shoes, wear them for just a few hours a  day. This prevents you from injuring your feet. Always look in your shoes before you put them on to be sure there are no objects inside.  Do not cross your legs. This may decrease the blood flow to your feet.  If you find a minor scrape, cut, or break in the skin on your feet, keep it and the skin around it clean and dry. These areas may be cleansed with mild soap and water. Do not cleanse the area with peroxide, alcohol, or iodine.  When you remove an adhesive bandage, be sure not to damage the skin around it.  If you have a wound, look at it several times a day to make sure it is healing.  Do not use heating pads or hot water bottles. They may burn your skin. If you have lost feeling in your feet or legs, you may not know it is happening until it is too late.  Make sure your health care provider performs a complete foot exam at least annually or more often if you have foot problems. Report any cuts, sores, or bruises to your health care provider immediately. SEEK MEDICAL CARE IF:   You have an injury that is not healing.  You have cuts or breaks in the skin.  You have an ingrown nail.  You notice redness on your legs or feet.  You feel burning or tingling in your legs or  feet.  You have pain or cramps in your legs and feet.  Your legs or feet are numb.  Your feet always feel cold. SEEK IMMEDIATE MEDICAL CARE IF:   There is increasing redness, swelling, or pain in or around a wound.  There is a red line that goes up your leg.  Pus is coming from a wound.  You develop a fever or as directed by your health care provider.  You notice a bad smell coming from an ulcer or wound.   This information is not intended to replace advice given to you by your health care provider. Make sure you discuss any questions you have with your health care provider.   Document Released: 11/18/2000 Document Revised: 07/24/2013 Document Reviewed: 04/30/2013 Elsevier Interactive Patient Education  [employer]

## 2016-08-15 ENCOUNTER — Other Ambulatory Visit: Payer: Self-pay | Admitting: Family Medicine

## 2016-08-15 ENCOUNTER — Other Ambulatory Visit (HOSPITAL_COMMUNITY)
Admission: RE | Admit: 2016-08-15 | Discharge: 2016-08-15 | Disposition: A | Discharge status: Home / Self Care | Payer: Medicare Other | Source: Ambulatory Visit | Attending: Family Medicine | Admitting: Family Medicine

## 2016-08-15 DIAGNOSIS — E7800 Pure hypercholesterolemia, unspecified: Secondary | ICD-10-CM | POA: Diagnosis not present

## 2016-08-15 DIAGNOSIS — Z0000 Encounter for general adult medical examination without abnormal findings: Secondary | ICD-10-CM | POA: Diagnosis not present

## 2016-08-15 DIAGNOSIS — E669 Obesity, unspecified: Secondary | ICD-10-CM | POA: Diagnosis not present

## 2016-08-15 DIAGNOSIS — Z23 Encounter for immunization: Secondary | ICD-10-CM | POA: Diagnosis not present

## 2016-08-15 DIAGNOSIS — K219 Gastro-esophageal reflux disease without esophagitis: Secondary | ICD-10-CM | POA: Diagnosis not present

## 2016-08-15 DIAGNOSIS — E1121 Type 2 diabetes mellitus with diabetic nephropathy: Secondary | ICD-10-CM | POA: Diagnosis not present

## 2016-08-15 DIAGNOSIS — Z79899 Other long term (current) drug therapy: Secondary | ICD-10-CM | POA: Diagnosis not present

## 2016-08-15 DIAGNOSIS — N181 Chronic kidney disease, stage 1: Secondary | ICD-10-CM | POA: Diagnosis not present

## 2016-08-15 DIAGNOSIS — Z124 Encounter for screening for malignant neoplasm of cervix: Secondary | ICD-10-CM | POA: Diagnosis not present

## 2016-08-15 DIAGNOSIS — G809 Cerebral palsy, unspecified: Secondary | ICD-10-CM | POA: Diagnosis not present

## 2016-08-15 DIAGNOSIS — J309 Allergic rhinitis, unspecified: Secondary | ICD-10-CM | POA: Diagnosis not present

## 2016-08-18 LAB — CYTOLOGY - PAP

## 2016-08-24 ENCOUNTER — Encounter: Payer: Self-pay | Admitting: Podiatry

## 2016-08-24 ENCOUNTER — Ambulatory Visit (INDEPENDENT_AMBULATORY_CARE_PROVIDER_SITE_OTHER): Payer: Medicare Other | Admitting: Podiatry

## 2016-08-24 VITALS — BP 125/81 | HR 95 | Resp 18

## 2016-08-24 DIAGNOSIS — M79676 Pain in unspecified toe(s): Secondary | ICD-10-CM | POA: Diagnosis not present

## 2016-08-24 DIAGNOSIS — B351 Tinea unguium: Secondary | ICD-10-CM

## 2016-08-24 NOTE — Patient Instructions (Signed)
Diabetes and Foot Care Diabetes may cause you to have problems because of poor blood supply (circulation) to your feet and legs. This may cause the skin on your feet to become thinner, break easier, and heal more slowly. Your skin may become dry, and the skin may peel and crack. You may also have nerve damage in your legs and feet causing decreased feeling in them. You may not notice minor injuries to your feet that could lead to infections or more serious problems. Taking care of your feet is one of the most important things you can do for yourself.  HOME CARE INSTRUCTIONS  Wear shoes at all times, even in the house. Do not go barefoot. Bare feet are easily injured.  Check your feet daily for blisters, cuts, and redness. If you cannot see the bottom of your feet, use a mirror or ask someone for help.  Wash your feet with warm water (do not use hot water) and mild soap. Then pat your feet and the areas between your toes until they are completely dry. Do not soak your feet as this can dry your skin.  Apply a moisturizing lotion or petroleum jelly (that does not contain alcohol and is unscented) to the skin on your feet and to dry, brittle toenails. Do not apply lotion between your toes.  Trim your toenails straight across. Do not dig under them or around the cuticle. File the edges of your nails with an emery board or nail file.  Do not cut corns or calluses or try to remove them with medicine.  Wear clean socks or stockings every day. Make sure they are not too tight. Do not wear knee-high stockings since they may decrease blood flow to your legs.  Wear shoes that fit properly and have enough cushioning. To break in new shoes, wear them for just a few hours a day. This prevents you from injuring your feet. Always look in your shoes before you put them on to be sure there are no objects inside.  Do not cross your legs. This may decrease the blood flow to your feet.  If you find a minor scrape,  cut, or break in the skin on your feet, keep it and the skin around it clean and dry. These areas may be cleansed with mild soap and water. Do not cleanse the area with peroxide, alcohol, or iodine.  When you remove an adhesive bandage, be sure not to damage the skin around it.  If you have a wound, look at it several times a day to make sure it is healing.  Do not use heating pads or hot water bottles. They may burn your skin. If you have lost feeling in your feet or legs, you may not know it is happening until it is too late.  Make sure your health care provider performs a complete foot exam at least annually or more often if you have foot problems. Report any cuts, sores, or bruises to your health care provider immediately. SEEK MEDICAL CARE IF:   You have an injury that is not healing.  You have cuts or breaks in the skin.  You have an ingrown nail.  You notice redness on your legs or feet.  You feel burning or tingling in your legs or feet.  You have pain or cramps in your legs and feet.  Your legs or feet are numb.  Your feet always feel cold. SEEK IMMEDIATE MEDICAL CARE IF:   There is increasing redness,   swelling, or pain in or around a wound.  There is a red line that goes up your leg.  Pus is coming from a wound.  You develop a fever or as directed by your health care provider.  You notice a bad smell coming from an ulcer or wound.   This information is not intended to replace advice given to you by your health care provider. Make sure you discuss any questions you have with your health care provider.   Document Released: 11/18/2000 Document Revised: 07/24/2013 Document Reviewed: 04/30/2013 Elsevier Interactive Patient Education 2016 [employer]vier Inc[employer]  

## 2016-08-24 NOTE — Progress Notes (Signed)
Patient ID: Collene LeydenJamey F Rosensteel, female   DOB: 21-Mar-1986, 30 y.o.   MRN: [id number]  Subjective: This patient presents for scheduled visit complaining of painful toenails and requests nail debridement.  Objective: Vascular: Nonpitting peripheral edema bilaterally DP and PT pulses 2/4 bilaterally  Neurological: Sensation to 10 g monofilament wire intact 5/5 bilaterally Vibratory sensation intact bilaterally Ankle reflexes weakly reactive bilaterally  Dermatological: The toenails are elongated, brittle, incurvated, discolored and tender to direct palpation 6-10  Musculoskeletal: Pes planus bilaterally Spastic upper and lower motor extremity motions  Assessment: Diabetic without complications History of cerebral palsy Symptomatic onychomycoses 6-10  Plan: Debridement of toenails 10 mechanically and electrically without any bleeding   Reappoint 3 months

## 2016-10-13 DIAGNOSIS — H5213 Myopia, bilateral: Secondary | ICD-10-CM | POA: Diagnosis not present

## 2016-10-13 DIAGNOSIS — E119 Type 2 diabetes mellitus without complications: Secondary | ICD-10-CM | POA: Diagnosis not present

## 2016-10-13 DIAGNOSIS — H52203 Unspecified astigmatism, bilateral: Secondary | ICD-10-CM | POA: Diagnosis not present

## 2016-11-09 DIAGNOSIS — E1121 Type 2 diabetes mellitus with diabetic nephropathy: Secondary | ICD-10-CM | POA: Diagnosis not present

## 2016-11-09 DIAGNOSIS — Z7984 Long term (current) use of oral hypoglycemic drugs: Secondary | ICD-10-CM | POA: Diagnosis not present

## 2016-11-09 DIAGNOSIS — Z794 Long term (current) use of insulin: Secondary | ICD-10-CM | POA: Diagnosis not present

## 2016-11-23 ENCOUNTER — Ambulatory Visit (INDEPENDENT_AMBULATORY_CARE_PROVIDER_SITE_OTHER): Payer: Medicare Other | Admitting: Podiatry

## 2016-11-23 ENCOUNTER — Encounter: Payer: Self-pay | Admitting: Podiatry

## 2016-11-23 DIAGNOSIS — M79676 Pain in unspecified toe(s): Secondary | ICD-10-CM

## 2016-11-23 DIAGNOSIS — B351 Tinea unguium: Secondary | ICD-10-CM | POA: Diagnosis not present

## 2016-11-23 NOTE — Patient Instructions (Signed)

## 2016-11-24 NOTE — Progress Notes (Signed)
Patient ID: Danielle LeydenJamey F Aguilar, female   DOB: 06/18/1986, 30 y.o.   MRN: [id number]    Subjective: This patient presents for scheduled visit complaining of painful toenails and requests nail debridement.  Objective: Vascular: Nonpitting peripheral edema bilaterally DP and PT pulses 2/4 bilaterally  Neurological: Sensation to 10 g monofilament wire intact 5/5 bilaterally Vibratory sensation intact bilaterally Ankle reflexes weakly reactive bilaterally  Dermatological: No open skin lesions bilaterally The toenails are elongated, brittle, incurvated, discolored and tender to direct palpation 6-10  Musculoskeletal: Pes planus bilaterally Spastic upper and lower motor extremity   Assessment: Type 2 diabetic Satisfactory vascular status Protective sensation intact Cerebral palsy with associated spasticity Symptomatic onychomycoses 6-10  Plan: Debridement of toenails 10 mechanically and electrically without any bleeding  Reappoint 3 months   Reappoint 3 months

## 2017-02-13 DIAGNOSIS — E1121 Type 2 diabetes mellitus with diabetic nephropathy: Secondary | ICD-10-CM | POA: Diagnosis not present

## 2017-02-13 DIAGNOSIS — N181 Chronic kidney disease, stage 1: Secondary | ICD-10-CM | POA: Diagnosis not present

## 2017-02-13 DIAGNOSIS — E7800 Pure hypercholesterolemia, unspecified: Secondary | ICD-10-CM | POA: Diagnosis not present

## 2017-02-14 DIAGNOSIS — E1121 Type 2 diabetes mellitus with diabetic nephropathy: Secondary | ICD-10-CM | POA: Diagnosis not present

## 2017-02-22 ENCOUNTER — Ambulatory Visit: Payer: Medicare Other | Admitting: Podiatry

## 2017-03-06 ENCOUNTER — Ambulatory Visit (INDEPENDENT_AMBULATORY_CARE_PROVIDER_SITE_OTHER): Payer: Medicare Other | Admitting: Podiatry

## 2017-03-06 DIAGNOSIS — E0843 Diabetes mellitus due to underlying condition with diabetic autonomic (poly)neuropathy: Secondary | ICD-10-CM

## 2017-03-06 DIAGNOSIS — M79609 Pain in unspecified limb: Secondary | ICD-10-CM | POA: Diagnosis not present

## 2017-03-06 DIAGNOSIS — L603 Nail dystrophy: Secondary | ICD-10-CM

## 2017-03-06 DIAGNOSIS — L608 Other nail disorders: Secondary | ICD-10-CM

## 2017-03-06 DIAGNOSIS — B351 Tinea unguium: Secondary | ICD-10-CM

## 2017-03-06 NOTE — Progress Notes (Signed)
   SUBJECTIVE Patient with a history of diabetes mellitus presents to office today complaining of elongated, thickened nails. Pain while ambulating in shoes. Patient is unable to trim their own nails.   OBJECTIVE General Patient is awake, alert, and oriented x 3 and in no acute distress. Derm Skin is dry and supple bilateral. Negative open lesions or macerations. Remaining integument unremarkable. Nails are tender, long, thickened and dystrophic with subungual debris, consistent with onychomycosis, 1-5 bilateral. No signs of infection noted. Vasc  DP and PT pedal pulses palpable bilaterally. Temperature gradient within normal limits.  Neuro Epicritic and protective threshold sensation diminished bilaterally.  Musculoskeletal Exam No symptomatic pedal deformities noted bilateral. Muscular strength within normal limits.  ASSESSMENT 1. Diabetes Mellitus w/ peripheral neuropathy 2. Onychomycosis of nail due to dermatophyte bilateral 3. Pain in foot bilateral  PLAN OF CARE 1. Patient evaluated today. 2. Instructed to maintain good pedal hygiene and foot care. Stressed importance of controlling blood sugar.  3. Mechanical debridement of nails 1-5 bilaterally performed using a nail nipper. Filed with dremel without incident.  4. Return to clinic in 3 mos.     Brent M. Evans, DPM [facility]  Dr. Brent M. Evans, DPM    [address]                                        [place], [place] [address]                Office [phone]  Fax [phone]       

## 2017-06-21 ENCOUNTER — Ambulatory Visit: Payer: Medicare Other | Admitting: Podiatry

## 2017-06-28 ENCOUNTER — Encounter: Payer: Self-pay | Admitting: Podiatry

## 2017-06-28 ENCOUNTER — Ambulatory Visit (INDEPENDENT_AMBULATORY_CARE_PROVIDER_SITE_OTHER): Payer: Medicare Other | Admitting: Podiatry

## 2017-06-28 DIAGNOSIS — M79609 Pain in unspecified limb: Secondary | ICD-10-CM

## 2017-06-28 DIAGNOSIS — B351 Tinea unguium: Secondary | ICD-10-CM

## 2017-06-28 NOTE — Patient Instructions (Signed)
Remove the Band-Aid on the second left toe 1-3 days and apply topical antibiotic ointment and a Band-Aid daily until a scab forms  Diabetes and Foot Care Diabetes may cause you to have problems because of poor blood supply (circulation) to your feet and legs. This may cause the skin on your feet to become thinner, break easier, and heal more slowly. Your skin may become dry, and the skin may peel and crack. You may also have nerve damage in your legs and feet causing decreased feeling in them. You may not notice minor injuries to your feet that could lead to infections or more serious problems. Taking care of your feet is one of the most important things you can do for yourself. Follow these instructions at home:  Wear shoes at all times, even in the house. Do not go barefoot. Bare feet are easily injured.  Check your feet daily for blisters, cuts, and redness. If you cannot see the bottom of your feet, use a mirror or ask someone for help.  Wash your feet with warm water (do not use hot water) and mild soap. Then pat your feet and the areas between your toes until they are completely dry. Do not soak your feet as this can dry your skin.  Apply a moisturizing lotion or petroleum jelly (that does not contain alcohol and is unscented) to the skin on your feet and to dry, brittle toenails. Do not apply lotion between your toes.  Trim your toenails straight across. Do not dig under them or around the cuticle. File the edges of your nails with an emery board or nail file.  Do not cut corns or calluses or try to remove them with medicine.  Wear clean socks or stockings every day. Make sure they are not too tight. Do not wear knee-high stockings since they may decrease blood flow to your legs.  Wear shoes that fit properly and have enough cushioning. To break in new shoes, wear them for just a few hours a day. This prevents you from injuring your feet. Always look in your shoes before you put them on to  be sure there are no objects inside.  Do not cross your legs. This may decrease the blood flow to your feet.  If you find a minor scrape, cut, or break in the skin on your feet, keep it and the skin around it clean and dry. These areas may be cleansed with mild soap and water. Do not cleanse the area with peroxide, alcohol, or iodine.  When you remove an adhesive bandage, be sure not to damage the skin around it.  If you have a wound, look at it several times a day to make sure it is healing.  Do not use heating pads or hot water bottles. They may burn your skin. If you have lost feeling in your feet or legs, you may not know it is happening until it is too late.  Make sure your health care provider performs a complete foot exam at least annually or more often if you have foot problems. Report any cuts, sores, or bruises to your health care provider immediately. Contact a health care provider if:  You have an injury that is not healing.  You have cuts or breaks in the skin.  You have an ingrown nail.  You notice redness on your legs or feet.  You feel burning or tingling in your legs or feet.  You have pain or cramps in your legs  and feet.  Your legs or feet are numb.  Your feet always feel cold. Get help right away if:  There is increasing redness, swelling, or pain in or around a wound.  There is a red line that goes up your leg.  Pus is coming from a wound.  You develop a fever or as directed by your health care provider.  You notice a bad smell coming from an ulcer or wound. This information is not intended to replace advice given to you by your health care provider. Make sure you discuss any questions you have with your health care provider. Document Released: 11/18/2000 Document Revised: 04/28/2016 Document Reviewed: 04/30/2013 Elsevier Interactive Patient Education  2017 [employer].

## 2017-06-29 NOTE — Progress Notes (Signed)
Patient ID: Danielle LeydenJamey F Aguilar, female   DOB: [birth year], 31 y.o.   MRN: [id number]   Subjective: This patient presents for scheduled visit complaining of painful toenails and requests nail debridement.  Objective: Pleasant orientated 3  Vascular:  DP and PT pulses 2/4 bilaterally Capillary reflex within normal limits bilaterally  Neurological: Sensation to 10 g monofilament wire intact 5/5 bilaterally Vibratory sensation intact bilaterally Ankle reflexes weakly reactive bilaterally  Dermatological: No open skin lesions bilaterally The toenails are elongated, brittle, incurvated, discolored and tender to direct palpation 6-10  Musculoskeletal: Pes planus bilaterally Spastic upper and lower motor extremity   Assessment: Type 2 diabetic Satisfactory vascular status Protective sensation intact Cerebral palsy with associated spasticity Symptomatic onychomycoses 6-10  Plan: Debridement of toenails 10 mechanically and electrically with slight bleeding distal second left toe treated with topical antibiotic ointment and Band-Aid. Patient instructed to move Band-Aid 1-3 days and continue to apply topical antibiotic ointment and Band-Aid until a scab forms.  Reappoint 3 months

## 2017-09-04 DIAGNOSIS — E669 Obesity, unspecified: Secondary | ICD-10-CM | POA: Diagnosis not present

## 2017-09-04 DIAGNOSIS — E1121 Type 2 diabetes mellitus with diabetic nephropathy: Secondary | ICD-10-CM | POA: Diagnosis not present

## 2017-09-04 DIAGNOSIS — Z1389 Encounter for screening for other disorder: Secondary | ICD-10-CM | POA: Diagnosis not present

## 2017-09-04 DIAGNOSIS — J309 Allergic rhinitis, unspecified: Secondary | ICD-10-CM | POA: Diagnosis not present

## 2017-09-04 DIAGNOSIS — E7800 Pure hypercholesterolemia, unspecified: Secondary | ICD-10-CM | POA: Diagnosis not present

## 2017-09-04 DIAGNOSIS — K219 Gastro-esophageal reflux disease without esophagitis: Secondary | ICD-10-CM | POA: Diagnosis not present

## 2017-09-04 DIAGNOSIS — N181 Chronic kidney disease, stage 1: Secondary | ICD-10-CM | POA: Diagnosis not present

## 2017-09-04 DIAGNOSIS — Z0000 Encounter for general adult medical examination without abnormal findings: Secondary | ICD-10-CM | POA: Diagnosis not present

## 2017-09-04 DIAGNOSIS — Z7984 Long term (current) use of oral hypoglycemic drugs: Secondary | ICD-10-CM | POA: Diagnosis not present

## 2017-09-04 DIAGNOSIS — Z23 Encounter for immunization: Secondary | ICD-10-CM | POA: Diagnosis not present

## 2017-09-04 DIAGNOSIS — G809 Cerebral palsy, unspecified: Secondary | ICD-10-CM | POA: Diagnosis not present

## 2017-10-02 ENCOUNTER — Ambulatory Visit (INDEPENDENT_AMBULATORY_CARE_PROVIDER_SITE_OTHER): Payer: Medicare Other | Admitting: Podiatry

## 2017-10-02 ENCOUNTER — Encounter: Payer: Self-pay | Admitting: Podiatry

## 2017-10-02 DIAGNOSIS — M79609 Pain in unspecified limb: Secondary | ICD-10-CM | POA: Diagnosis not present

## 2017-10-02 DIAGNOSIS — B351 Tinea unguium: Secondary | ICD-10-CM | POA: Diagnosis not present

## 2017-10-02 NOTE — Progress Notes (Signed)
Patient ID: Collene LeydenJamey F Shaff, female   DOB: 11/20/1986, 31 y.o.   MRN: [id number]    Subjective: This patient presents for scheduled visit complaining of painful toenails and requests nail debridement.  Objective: Pleasant orientated 3  Vascular:  DP and PT pulses 2/4 bilaterally Capillary reflex within normal limits bilaterally  Neurological: Sensation to 10 g monofilament wire intact 5/5 bilaterally Vibratory sensation intact bilaterally Ankle reflexes weakly reactive bilaterally  Dermatological: No open skin lesions bilaterally The toenails are elongated, brittle, incurvated, discolored and tender to direct palpation 6-10  Musculoskeletal: Pes planus bilaterally Spastic upper and lower motor extremity   Assessment: Type 2 diabetic Satisfactory vascular status Protective sensation intact Cerebral palsywith associated spasticity Symptomatic onychomycoses 6-10  Plan: Debridement of toenails 10 mechanically and electrically with slight bleeding distal left hallux, toe treated with topical antibiotic ointment and Band-Aid. Patient instructed to move Band-Aid 1-3 days and continue to apply topical antibiotic ointment and Band-Aid until a scab forms.  Reappoint 3 months

## 2017-10-02 NOTE — Patient Instructions (Signed)
Removed Band-Aid on left great toe 1-3 days and apply topical antibiotic ointment and a Band-Aid daily until a scab forms

## 2017-10-19 DIAGNOSIS — E119 Type 2 diabetes mellitus without complications: Secondary | ICD-10-CM | POA: Diagnosis not present

## 2017-10-19 DIAGNOSIS — H5213 Myopia, bilateral: Secondary | ICD-10-CM | POA: Diagnosis not present

## 2017-10-19 DIAGNOSIS — H52203 Unspecified astigmatism, bilateral: Secondary | ICD-10-CM | POA: Diagnosis not present

## 2017-10-19 DIAGNOSIS — Z7984 Long term (current) use of oral hypoglycemic drugs: Secondary | ICD-10-CM | POA: Diagnosis not present

## 2017-12-11 DIAGNOSIS — Z7984 Long term (current) use of oral hypoglycemic drugs: Secondary | ICD-10-CM | POA: Diagnosis not present

## 2017-12-11 DIAGNOSIS — E1165 Type 2 diabetes mellitus with hyperglycemia: Secondary | ICD-10-CM | POA: Diagnosis not present

## 2018-01-01 ENCOUNTER — Ambulatory Visit: Payer: Medicare Other | Admitting: Podiatry

## 2018-03-12 DIAGNOSIS — E7800 Pure hypercholesterolemia, unspecified: Secondary | ICD-10-CM | POA: Diagnosis not present

## 2018-03-12 DIAGNOSIS — Z6841 Body Mass Index (BMI) 40.0 and over, adult: Secondary | ICD-10-CM | POA: Diagnosis not present

## 2018-03-12 DIAGNOSIS — E1121 Type 2 diabetes mellitus with diabetic nephropathy: Secondary | ICD-10-CM | POA: Diagnosis not present

## 2018-03-12 DIAGNOSIS — Z7984 Long term (current) use of oral hypoglycemic drugs: Secondary | ICD-10-CM | POA: Diagnosis not present

## 2018-03-12 DIAGNOSIS — N181 Chronic kidney disease, stage 1: Secondary | ICD-10-CM | POA: Diagnosis not present

## 2018-07-02 DIAGNOSIS — Z6841 Body Mass Index (BMI) 40.0 and over, adult: Secondary | ICD-10-CM | POA: Diagnosis not present

## 2018-07-02 DIAGNOSIS — E1121 Type 2 diabetes mellitus with diabetic nephropathy: Secondary | ICD-10-CM | POA: Diagnosis not present

## 2018-07-02 DIAGNOSIS — N181 Chronic kidney disease, stage 1: Secondary | ICD-10-CM | POA: Diagnosis not present

## 2018-07-02 DIAGNOSIS — E7800 Pure hypercholesterolemia, unspecified: Secondary | ICD-10-CM | POA: Diagnosis not present

## 2018-08-06 DIAGNOSIS — S81801A Unspecified open wound, right lower leg, initial encounter: Secondary | ICD-10-CM | POA: Diagnosis not present

## 2018-08-06 DIAGNOSIS — L03012 Cellulitis of left finger: Secondary | ICD-10-CM | POA: Diagnosis not present

## 2018-08-09 DIAGNOSIS — L03115 Cellulitis of right lower limb: Secondary | ICD-10-CM | POA: Diagnosis not present

## 2018-08-13 ENCOUNTER — Encounter: Payer: Medicare HMO | Attending: Family Medicine | Admitting: Registered"

## 2018-08-13 DIAGNOSIS — E119 Type 2 diabetes mellitus without complications: Secondary | ICD-10-CM | POA: Insufficient documentation

## 2018-08-13 DIAGNOSIS — E1165 Type 2 diabetes mellitus with hyperglycemia: Secondary | ICD-10-CM

## 2018-08-13 DIAGNOSIS — Z713 Dietary counseling and surveillance: Secondary | ICD-10-CM | POA: Diagnosis not present

## 2018-08-13 NOTE — Patient Instructions (Signed)
Aim to eat balanced meals and snacks. Sandwich (chicken, egg or tuna salad), grapes, almonds Your breakfast sounds balanced Continue with your activity as tolerated Consider following your MD instructions to increase your insulin

## 2018-08-14 ENCOUNTER — Encounter: Payer: Self-pay | Admitting: Registered"

## 2018-08-14 DIAGNOSIS — E1165 Type 2 diabetes mellitus with hyperglycemia: Secondary | ICD-10-CM | POA: Insufficient documentation

## 2018-08-14 NOTE — Progress Notes (Signed)
Diabetes Self-Management Education  Visit Type: First/Initial  Appt. Start Time: 1530 Appt. End Time: 1630  08/14/2018  [name], identified by name and date of birth, is a 32 y.o. female with a diagnosis of Diabetes: Type 2.   This patient is accompanied in the office by her mother. Pt would have been at this appointment by herself, but the [employer] bus left her and she called her mother to bring her to appointment.    ASSESSMENT Pt has Cerebral Palsy with R hemiplegia. Pt states she checks her blood sugar only a few times per week due to the difficulty doing it with use of one hand. RD suggested she look into continuous glucose monitors and see if her insurance would cover that. Pt states her FBG 200s, not checking PPG.  RD is not clear why patient seems a little resistant to increasing insulin. Pt states that her father asked her about it recently she increased to 10 units. From MD notes RD is not sure what the timing of increasing should be and pt states she is not clear how she should be increasing but states she will contact her doctor to get instructions.   Patient works at [employer] and states she works out on the bike, treadmill and elliptical on the days she works (~5x).   Pt began the appointment stating doesn't like vegetables, but then listed the ones she does like including broccoli, asparagus, green beans, sugar snap peas. Pt states she enjoys grapes, baked apples and berries. From patient dietary recall it appears she could use more vegetables, but has a pretty good handle on putting together balanced meals and for the most part diet doesn't appear problematic. Taking into consideration patient's disability with hemiplegia, at next visit patient would benefit from ideas for easy to prepare meals to get more variety in her diet.   Patient's mother states that appetite regulation has been affected by the CP and inquired about support groups. RD was not able to locate appropriate  support groups, but plans to recommend books at next appointment.  Diabetes Self-Management Education - 08/14/18 1417      Visit Information   Visit Type  First/Initial      Initial Visit   Diabetes Type  Type 2    Are you currently following a meal plan?  No    Are you taking your medications as prescribed?  No   has not been increasing insulin as instructed   Date Diagnosed  2007      Psychosocial Assessment   Patient Belief/Attitude about Diabetes  Defeat/Burnout      Complications   Last HgB A1C per patient/outside source  10 %   per referral   How often do you check your blood sugar?  3-4 times / week   2-3 x week    Fasting Blood glucose range (mg/dL)  >161    Number of hypoglycemic episodes per month  0    Have you had a dilated eye exam in the past 12 months?  Yes    Have you had a dental exam in the past 12 months?  Yes      Dietary Intake   Breakfast  Greek yogurt, almonds, 10 grapes    Snack (morning)  almonds, cheese stick, coffee with 5 creamers at work    [employer]  [place] slices, veggie tots, baked apple    Dinner  pimento cheese or PB sandwich. OR cottage cheese, almonds, grapes    Snack (evening)  greek yogurt grapes    Beverage(s)  water, coffee with flavored creamer at work      Exercise   Exercise Type  Moderate (swimming / aerobic walking)    How many days per week to you exercise?  5    How many minutes per day do you exercise?  60    Total minutes per week of exercise  300      Patient Education   Previous Diabetes Education  Yes (please comment)    Nutrition management   Role of diet in the treatment of diabetes and the relationship between the three main macronutrients and blood glucose level;Food label reading, portion sizes and measuring food.    Medications  Reviewed patients medication for diabetes, action, purpose, timing of dose and side effects.    Monitoring  Identified appropriate SMBG and/or A1C goals.      Individualized Goals (developed  by patient)   Nutrition  General guidelines for healthy choices and portions discussed    Physical Activity  Exercise 3-5 times per week    Medications  take my medication as prescribed    Monitoring   test my blood glucose as discussed      Outcomes   Expected Outcomes  Demonstrated interest in learning. Expect positive outcomes    Future DMSE  PRN    Program Status  Completed      Individualized Plan for Diabetes Self-Management Training:   Learning Objective:  Patient will have a greater understanding of diabetes self-management. Patient education plan is to attend individual and/or group sessions per assessed needs and concerns.   Patient Instructions  Aim to eat balanced meals and snacks. Sandwich (chicken, egg or tuna salad), grapes, almonds Your breakfast sounds balanced Continue with your activity as tolerated Consider following your MD instructions to increase your insulin  Expected Outcomes:  Demonstrated interest in learning. Expect positive outcomes  Education material provided: Medications list, List of local endocrinologists, [employer] brochure, snack sheet  If problems or questions, patient to contact team via:  Phone  Future DSME appointment: PRN

## 2018-08-27 DIAGNOSIS — R69 Illness, unspecified: Secondary | ICD-10-CM | POA: Diagnosis not present

## 2018-08-28 DIAGNOSIS — R69 Illness, unspecified: Secondary | ICD-10-CM | POA: Diagnosis not present

## 2018-09-07 ENCOUNTER — Encounter: Payer: Self-pay | Admitting: Podiatry

## 2018-09-07 ENCOUNTER — Ambulatory Visit (INDEPENDENT_AMBULATORY_CARE_PROVIDER_SITE_OTHER): Payer: Medicare HMO | Admitting: Podiatry

## 2018-09-07 DIAGNOSIS — L309 Dermatitis, unspecified: Secondary | ICD-10-CM

## 2018-09-07 DIAGNOSIS — G809 Cerebral palsy, unspecified: Secondary | ICD-10-CM

## 2018-09-07 DIAGNOSIS — M79676 Pain in unspecified toe(s): Secondary | ICD-10-CM | POA: Diagnosis not present

## 2018-09-07 DIAGNOSIS — E119 Type 2 diabetes mellitus without complications: Secondary | ICD-10-CM

## 2018-09-07 DIAGNOSIS — M79609 Pain in unspecified limb: Principal | ICD-10-CM

## 2018-09-07 DIAGNOSIS — B351 Tinea unguium: Secondary | ICD-10-CM | POA: Diagnosis not present

## 2018-09-07 NOTE — Progress Notes (Signed)
Complaint:  Visit Type: Patient returns to my office for continued preventative foot care services. Complaint: Patient states" my nails have grown long and thick and become painful to walk and wear shoes" Patient has been diagnosed with DM with no foot complications.  Patient has CP. The patient presents for preventative foot care services. No changes to ROS  Podiatric Exam: Vascular: dorsalis pedis and posterior tibial pulses are palpable bilateral. Capillary return is immediate. Temperature gradient is WNL. Skin turgor WNL  Sensorium: Normal Semmes Weinstein monofilament test. Normal tactile sensation bilaterally. Nail Exam: Pt has thick disfigured discolored nails with subungual debris noted bilateral entire nail hallux through fifth toenails Ulcer Exam: There is no evidence of ulcer or pre-ulcerative changes or infection. Orthopedic Exam: Muscle tone and strength are WNL. No limitations in general ROM. No crepitus or effusions noted. Foot type and digits show no abnormalities. Bony prominences are unremarkable. Skin: No Porokeratosis. No infection or ulcers.  Dry transverse fissures noted due to dry skin plantar aspect left foot.  Diagnosis:  Onychomycosis, , Pain in right toe, pain in left toes  Treatment & Plan Procedures and Treatment: Consent by patient was obtained for treatment procedures.   Debridement of mycotic and hypertrophic toenails, 1 through 5 bilateral and clearing of subungual debris. No ulceration, no infection noted.  Return Visit-Office Procedure: Patient instructed to return to the office for a follow up visit 3 months for continued evaluation and treatment.    Helane Gunther DPM

## 2018-09-24 ENCOUNTER — Ambulatory Visit: Payer: Self-pay | Admitting: Registered"

## 2018-10-01 ENCOUNTER — Encounter: Payer: Medicare HMO | Attending: Family Medicine | Admitting: Registered"

## 2018-10-01 DIAGNOSIS — Z713 Dietary counseling and surveillance: Secondary | ICD-10-CM | POA: Insufficient documentation

## 2018-10-01 DIAGNOSIS — E119 Type 2 diabetes mellitus without complications: Secondary | ICD-10-CM | POA: Insufficient documentation

## 2018-10-01 DIAGNOSIS — E1165 Type 2 diabetes mellitus with hyperglycemia: Secondary | ICD-10-CM

## 2018-10-01 NOTE — Patient Instructions (Addendum)
Consider trying other fruits instead of grapes, something like berries or apples. Continue with your plan to ask about the continuous glucose monitor Think about ways or times you really enjoy yourself. Consider a counselor that specializes in healtlhy relationship with food

## 2018-10-01 NOTE — Progress Notes (Signed)
Diabetes Self-Management Education  Visit Type: First/Initial  Appt. Start Time: 1415 Appt. End Time: 1445  10/02/2018  Danielle Aguilar, identified by name and date of birth, is a 32 y.o. female with a diagnosis of Diabetes: Type 2.   ASSESSMENT Pt states she is following her doctor's instruction to increase insulin 2 units per night until her FBG gets down to 130 mg/dL. Due to limited use of her Right hand, she only checks BG 2x week, always over 200 mg/dL with last Z6X at 09%. Pt's mother states patient's insurance is changing soon and believes she will have better coverage to include an endocrinologist as well as get a CGM.  Per reported dietary intake patient demonstrates balanced meal planning for BG control. At end of visit Patient's mother told patient she needs to be honest about what she is eating so she can get help. Pt's mother stated that when she goes over to patient's apartment there is evidence of boxes of cookies and other other food that are high in sugar/carbohydrates and is very worried about her daughters blood sugar. RD educated the dangers of shaming around food and potential of triggering an eating disorder. Patient mother is very open to hearing this and wants to know how she can help.  Although patient has a regular exercise routine, she may also be consuming a very sweet, large coffee each time she goes to the [facility]. Patient's mother states she is very active in the community and with friends but states all their activities revolve around food. Patient appeared uncomfortable with her mother 'telling on her', but stated she is still open to continue with more follow-up appointments and would consider also including a therapist on her health care team.  Barriers to managing DM appear to be related to cerebral palsy, including increased appetite, difficulty preparing food and checking blood sugar.  Diabetes Self-Management Education - 10/01/18 1416      Visit Information    Visit Type  First/Initial      Initial Visit   Diabetes Type  Type 2      Complications   Fasting Blood glucose range (mg/dL)  >604   [phone]     Dietary Intake   Breakfast  cottage cheese, almonds, grapes    Snack (morning)  coffee    Lunch  [employer], apple pecan salad, sm fountain diet soda    Snack (afternoon)  cookies per mother    Dinner  broccoli, veggie tots    Beverage(s)  water, sweetened coffee, diet soda      Exercise   Exercise Type  Moderate (swimming / aerobic walking)    How many days per week to you exercise?  5    How many minutes per day do you exercise?  60    Total minutes per week of exercise  300      Patient Education   Nutrition management   Role of diet in the treatment of diabetes and the relationship between the three main macronutrients and blood glucose level    Psychosocial adjustment  Worked with patient to identify barriers to care and solutions;Role of stress on diabetes      Individualized Goals (developed by patient)   Nutrition  General guidelines for healthy choices and portions discussed    Physical Activity  Exercise 3-5 times per week    Medications  take my medication as prescribed    Problem Solving  --   look into CGM   Health Coping  Other (  comment)   discuss food relationship with counselor     Outcomes   Expected Outcomes  Demonstrated interest in learning. Expect positive outcomes    Future DMSE  4-6 wks    Program Status  Completed      Individualized Plan for Diabetes Self-Management Training:   Learning Objective:  Patient will have a greater understanding of diabetes self-management. Patient education plan is to attend individual and/or group sessions per assessed needs and concerns.   Patient Instructions  Consider trying other fruits instead of grapes, something like berries or apples. Continue with your plan to ask about the continuous glucose monitor Think about ways or times you really enjoy  yourself. Consider a counselor that specializes in healtlhy relationship with food  Expected Outcomes:  Demonstrated interest in learning. Expect positive outcomes  Education material provided: none  If problems or questions, patient to contact team via:  Phone and MyChart  Future DSME appointment: 4-6 wks

## 2018-10-17 ENCOUNTER — Ambulatory Visit: Payer: Medicare Other | Admitting: Podiatry

## 2018-10-22 DIAGNOSIS — H5213 Myopia, bilateral: Secondary | ICD-10-CM | POA: Diagnosis not present

## 2018-10-22 DIAGNOSIS — Z794 Long term (current) use of insulin: Secondary | ICD-10-CM | POA: Diagnosis not present

## 2018-10-22 DIAGNOSIS — E119 Type 2 diabetes mellitus without complications: Secondary | ICD-10-CM | POA: Diagnosis not present

## 2018-10-22 DIAGNOSIS — H52203 Unspecified astigmatism, bilateral: Secondary | ICD-10-CM | POA: Diagnosis not present

## 2018-10-29 ENCOUNTER — Encounter: Payer: Medicare HMO | Attending: Family Medicine | Admitting: Registered"

## 2018-10-29 ENCOUNTER — Encounter: Payer: Self-pay | Admitting: Registered"

## 2018-10-29 ENCOUNTER — Ambulatory Visit: Payer: Self-pay | Admitting: Registered"

## 2018-10-29 DIAGNOSIS — Z713 Dietary counseling and surveillance: Secondary | ICD-10-CM | POA: Diagnosis not present

## 2018-10-29 DIAGNOSIS — E119 Type 2 diabetes mellitus without complications: Secondary | ICD-10-CM | POA: Insufficient documentation

## 2018-10-29 NOTE — Patient Instructions (Signed)
-   Aim to check blood sugar 2 hours after lunch.   - Add vegetables to [employer] meals on Wednesdays nights.   - Include carbohydrates, protein, and non-starchy vegetables at lunch and dinner.

## 2018-10-29 NOTE — Progress Notes (Signed)
Diabetes Self-Management Education  Visit Type:  Follow-up  Appt. Start Time: 3:10 Appt. End Time: 4:07  10/29/2018  [name], identified by name and date of birth, is a 32 y.o. female with a diagnosis of Diabetes: Type 2.   ASSESSMENT  Pt states she does not like a lot of vegetables. Pt reports BS averages: fasting (270-340). Pt states she takes [employer] bus to work. 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 Pt states she has meals planned out for the week.   Pt states she has [employer] meal every Wednesday and feels guilt and shame attached to that because she knows she should not be eating that. Pt also feels guilt around having coffee-mocha mix after working out on some days.   There were no vitals taken for this visit. There is no height or weight on file to calculate BMI.   Diabetes Self-Management Education - 10/29/18 1518      Psychosocial Assessment   Self-care barriers  Other (comment)   limited barriers due to history of cerebral palsy   Self-management support  Doctor's office;Family    Patient Concerns  Nutrition/Meal planning    Special Needs  None    Preferred Learning Style  No preference indicated    Learning Readiness  Ready      Complications   How often do you check your blood sugar?  3-4 times / week    Fasting Blood glucose range (mg/dL)  >161>200    Number of hypoglycemic episodes per month  0    Number of hyperglycemic episodes per week  0    Have you had a dilated eye exam in the past 12 months?  Yes    Have you had a dental exam in the past 12 months?  Yes    Are you checking your feet?  Yes    How many days per week are you checking your feet?  3      Dietary Intake   Breakfast  a few bites of cottage cheese + berries + almonds    Snack (morning)  none     Lunch  1/2 grilled chicken apple pecan salad    Snack (afternoon)  none    Dinner  veggie patties + broccoli with cheese sauce +  baked apples or [place] slices + veggie tots + greek yogurt + almonds + trail mix or [employer]-chicken sandiwch + fries + cookies    Snack (evening)  none    Beverage(s)  diet coke, water (64 oz), coffee/mocha mix, flavored water, sparkling water      Exercise   Exercise Type  Moderate (swimming / aerobic walking)   bike and treadmill   How many days per week to you exercise?  6    How many minutes per day do you exercise?  45    Total minutes per week of exercise  270      Patient Education   Nutrition management   Information on hints to eating out and maintain blood glucose control.    Physical activity and exercise   Role of exercise on diabetes management, blood pressure control and cardiac health.    Monitoring  Purpose and frequency of SMBG.      Individualized Goals (developed by patient)   Nutrition  General guidelines for healthy choices and portions discussed    Physical Activity  Exercise 5-7 days per week;45 minutes per day    Medications  take my medication as prescribed    Monitoring   test my blood glucose as discussed    Reducing Risk  examine blood glucose patterns    Health Coping  Not Applicable      Post-Education Assessment   Patient understands the diabetes disease and treatment process.  Demonstrates understanding / competency    Patient understands incorporating nutritional management into lifestyle.  Demonstrates understanding / competency    Patient undertands incorporating physical activity into lifestyle.  Demonstrates understanding / competency    Patient understands using medications safely.  Demonstrates understanding / competency    Patient understands monitoring blood glucose, interpreting and using results  Needs Review    Patient understands how to develop strategies to promote health/change behavior.  Demonstrates understanding / competency      Outcomes   Program Status  Completed      Subsequent Visit   Since your last visit have you continued  or begun to take your medications as prescribed?  Yes    Since your last visit have you had your blood pressure checked?  No    Since your last visit have you experienced any weight changes?  No change    Since your last visit, are you checking your blood glucose at least once a day?  No   checks 2x/week       Learning Objective:  Patient will have a greater understanding of diabetes self-management. Patient education plan is to attend individual and/or group sessions per assessed needs and concerns.   Plan:   Patient Instructions  - Aim to check blood sugar 2 hours after lunch.   - Add vegetables to [employer] meals on Wednesdays nights.   - Include carbohydrates, protein, and non-starchy vegetables at lunch and dinner.     Expected Outcomes:  Demonstrated interest in learning. Expect positive outcomes  Education material provided: none  If problems or questions, patient to contact team via:  Phone and Email  Future DSME appointment: - 2 wks

## 2018-12-03 ENCOUNTER — Encounter: Payer: Medicare HMO | Attending: Family Medicine | Admitting: Registered"

## 2018-12-03 ENCOUNTER — Encounter: Payer: Self-pay | Admitting: Registered"

## 2018-12-03 DIAGNOSIS — E119 Type 2 diabetes mellitus without complications: Secondary | ICD-10-CM | POA: Diagnosis not present

## 2018-12-03 DIAGNOSIS — Z713 Dietary counseling and surveillance: Secondary | ICD-10-CM | POA: Diagnosis not present

## 2018-12-03 NOTE — Progress Notes (Signed)
  Medical Nutrition Therapy:  Appt start time: 3:02 end time:  4:00.   Assessment:  Primary concerns today: Pt states are numbers are still higher. Pt expresses interest in endocrinologist. Pt states she checks FBS on Mon and Wed and Tues, Thurs checks after lunch or before dinner.   Pt states she likes butternut squash, cauliflower, broccoli with cheese or ranch, green beans, peas, beets as vegetable options. Pt loves veggie tots.    Pt states she feels discouraged because she works out a lot but she is still looks the way she does. Pt states she wishes she could take a pill to fix herself.   Preferred Learning Style:   No preference indicated   Learning Readiness:   Ready  Change in progress   MEDICATIONS: See list   DIETARY INTAKE:  Usual eating pattern includes 3 meals and 0 snacks per day.  Everyday foods include yogurt, nuts, berries, veggie tots, fried chicken.  Avoided foods include some vegetables.    24-hr recall:  B ( AM): triple zero greek yogurt + almonds + berries  Snk ( AM): none  L ( PM): [employer]-low carb burger  Snk ( PM): none D ( PM): 2 small fried chicken breasts + baked apples + veggie tots Snk ( PM): none Beverages: diet soda  Usual physical activity: 6 days/week, 45-60 min biking and treadmill  Estimated energy needs: 2000-2200 calories 225-248 g carbohydrates 150-165 g protein 56-61 g fat  Progress Towards Goal(s):  In progress.   Nutritional Diagnosis:  NB-1.1 Food and nutrition-related knowledge deficit As related to diabetes.  As evidenced by pt verablizes incomplete information.    Intervention:  Nutrition education and counseling. Pt was educated and counseled on the benefits of physical activity related to diabetes and importance of checking blood sugars. Pt was also educated on the recommended carbohydrate intake for her at each meal. Pt was in agreement with goals listed.  Goals: - Aiming to have between 3-4 carbohydrate choices  with each meal. Use carb counting card as guide.  - Seek endocrinologist.   Teaching Method Utilized:  Visual Auditory Hands on  Handouts given during visit include:  Carbohydrate counting  Barriers to learning/adherence to lifestyle change: none identified  Demonstrated degree of understanding via:  Teach Back   Monitoring/Evaluation:  Dietary intake, exercise, and body weight in 1 month(s).

## 2018-12-03 NOTE — Patient Instructions (Signed)
-   Aiming to have between 3-4 carbohydrate choices with each meal. Use carb counting card as guide.   - Seek endocrinologist.

## 2018-12-14 ENCOUNTER — Encounter: Payer: Self-pay | Admitting: Podiatry

## 2018-12-14 ENCOUNTER — Ambulatory Visit (INDEPENDENT_AMBULATORY_CARE_PROVIDER_SITE_OTHER): Payer: Medicare HMO | Admitting: Podiatry

## 2018-12-14 DIAGNOSIS — E119 Type 2 diabetes mellitus without complications: Secondary | ICD-10-CM | POA: Diagnosis not present

## 2018-12-14 DIAGNOSIS — M79609 Pain in unspecified limb: Principal | ICD-10-CM

## 2018-12-14 DIAGNOSIS — B351 Tinea unguium: Secondary | ICD-10-CM | POA: Diagnosis not present

## 2018-12-14 DIAGNOSIS — R234 Changes in skin texture: Secondary | ICD-10-CM

## 2018-12-14 DIAGNOSIS — M79676 Pain in unspecified toe(s): Secondary | ICD-10-CM | POA: Diagnosis not present

## 2018-12-14 DIAGNOSIS — G809 Cerebral palsy, unspecified: Secondary | ICD-10-CM

## 2018-12-14 NOTE — Progress Notes (Signed)
Complaint:  Visit Type: Patient returns to my office for continued preventative foot care services. Complaint: Patient states" my nails have grown long and thick and become painful to walk and wear shoes" Patient has been diagnosed with DM with no foot complications.  Patient has CP. The patient presents for preventative foot care services. No changes to ROS  Podiatric Exam: Vascular: dorsalis pedis and posterior tibial pulses are palpable bilateral. Capillary return is immediate. Temperature gradient is WNL. Skin turgor WNL  Sensorium: Normal Semmes Weinstein monofilament test. Normal tactile sensation bilaterally. Nail Exam: Pt has thick disfigured discolored nails with subungual debris noted bilateral entire nail hallux through fifth toenails Ulcer Exam: There is no evidence of ulcer or pre-ulcerative changes or infection. Orthopedic Exam: Muscle tone and strength are WNL. No limitations in general ROM. No crepitus or effusions noted. Foot type and digits show no abnormalities. Bony prominences are unremarkable.  Pes planus  B/L. Skin: No Porokeratosis. No infection or ulcers.  Dry transverse fissures noted due to dry skin plantar aspect left foot.  Diagnosis:  Onychomycosis, , Pain in right toe, pain in left toes  Treatment & Plan Procedures and Treatment: Consent by patient was obtained for treatment procedures.   Debridement of mycotic and hypertrophic toenails, 1 through 5 bilateral and clearing of subungual debris. No ulceration, no infection noted. Told to use moisturizer. For plantar feet. Return Visit-Office Procedure: Patient instructed to return to the office for a follow up visit 3 months for continued evaluation and treatment.    Helane Gunther DPM

## 2019-01-07 ENCOUNTER — Encounter: Payer: Medicare HMO | Attending: Family Medicine | Admitting: Registered"

## 2019-01-07 ENCOUNTER — Encounter: Payer: Self-pay | Admitting: Registered"

## 2019-01-07 DIAGNOSIS — Z713 Dietary counseling and surveillance: Secondary | ICD-10-CM | POA: Diagnosis not present

## 2019-01-07 DIAGNOSIS — E119 Type 2 diabetes mellitus without complications: Secondary | ICD-10-CM | POA: Diagnosis not present

## 2019-01-07 NOTE — Progress Notes (Signed)
  Medical Nutrition Therapy:  Appt start time: 3:02 end time:  4:00.   Assessment:  Primary concerns today: Pt arrives staring she is frustrated her BS numbers are still increasing; states she is is having to continue to increase Levemir. Pt states she took last week off from checking BS to give her finger a break (only using ring finger on left hand as site). Pt reports numbers: FBS (218 - 310) and before dinner (267- 325). Pt states she does not know what she is doing wrong. Pt states she is busy. Pt states she still has guilt and shame related to food due to comments made by mom, family and others. Pt states she will go out to eat alone and not tell her mom where she goes to eat to avoid negative comments. Pt states this is hard for her to discuss. Pt states she recently went out to eat and indulged with pasta, soup, and garlic bread.   Pt states she likes butternut squash, cauliflower (faux potatoes), broccoli with cheese or ranch, green beans, peas, beets as vegetable options. Pt loves veggie tots.    Pt states she feels discouraged because she works out a lot but she is still looks the way she does. Pt states she wishes she could take a pill to fix herself.   Next visit: discussion with mom   Preferred Learning Style:   No preference indicated   Learning Readiness:   Ready  Change in progress   MEDICATIONS: See list   DIETARY INTAKE:  Usual eating pattern includes 3 meals and 0 snacks per day.  Everyday foods include yogurt, nuts, berries, veggie tots, fried chicken.  Avoided foods include some vegetables.    24-hr recall:  B ( AM): high protein cereal + almonds + berries + Fairlife milk Snk ( AM): none  L ( PM): PBJ sandwich + trail mix + cheese stick  Snk ( PM): none D ( PM): 2 small fried chicken breasts + baked apples + veggie tots Snk ( PM): none Beverages: diet soda, Fair Life milk (in cereal), water (64 oz), sparkling water  Usual physical activity: 6 days/week,  45-60 min biking and treadmill  Estimated energy needs: 2000-2200 calories 225-248 g carbohydrates 150-165 g protein 56-61 g fat  Progress Towards Goal(s):  Some progress.   Nutritional Diagnosis:  NB-1.5 Disordered eating pattern As related to balanced meals.  As evidenced by pt reports indulging.    Intervention:  Nutrition education and counseling. Pt was educated and counseled on fiber intake and ways to increase it. Pt was also educated on what happens when we restrict food, feel guilt, shame, etc related to food. Pt was encouraged to have balanced meals and will have discussion with mom at next visit. Pt was in agreement with goals listed.  Goals: - Try raw cauliflower and broccoli with sandwiches for lunch.  - Aim to have at least 2 vegetables a day.   Teaching Method Utilized:  Visual Auditory Hands on  Handouts given during visit include:  none  Barriers to learning/adherence to lifestyle change: none identified  Demonstrated degree of understanding via:  Teach Back   Monitoring/Evaluation:  Dietary intake, exercise, and body weight in 1 month(s).

## 2019-01-07 NOTE — Patient Instructions (Addendum)
-   Try raw cauliflower and broccoli with sandwiches for lunch.   - Aim to have at least 2 vegetables a day.

## 2019-01-21 DIAGNOSIS — Z7984 Long term (current) use of oral hypoglycemic drugs: Secondary | ICD-10-CM | POA: Diagnosis not present

## 2019-01-21 DIAGNOSIS — J309 Allergic rhinitis, unspecified: Secondary | ICD-10-CM | POA: Diagnosis not present

## 2019-01-21 DIAGNOSIS — E7800 Pure hypercholesterolemia, unspecified: Secondary | ICD-10-CM | POA: Diagnosis not present

## 2019-01-21 DIAGNOSIS — K219 Gastro-esophageal reflux disease without esophagitis: Secondary | ICD-10-CM | POA: Diagnosis not present

## 2019-01-21 DIAGNOSIS — N181 Chronic kidney disease, stage 1: Secondary | ICD-10-CM | POA: Diagnosis not present

## 2019-01-21 DIAGNOSIS — Z0000 Encounter for general adult medical examination without abnormal findings: Secondary | ICD-10-CM | POA: Diagnosis not present

## 2019-01-21 DIAGNOSIS — E1121 Type 2 diabetes mellitus with diabetic nephropathy: Secondary | ICD-10-CM | POA: Diagnosis not present

## 2019-01-21 DIAGNOSIS — G809 Cerebral palsy, unspecified: Secondary | ICD-10-CM | POA: Diagnosis not present

## 2019-02-04 ENCOUNTER — Encounter: Payer: Medicare HMO | Attending: Family Medicine | Admitting: Registered"

## 2019-02-04 DIAGNOSIS — E119 Type 2 diabetes mellitus without complications: Secondary | ICD-10-CM | POA: Diagnosis not present

## 2019-02-04 DIAGNOSIS — Z713 Dietary counseling and surveillance: Secondary | ICD-10-CM | POA: Insufficient documentation

## 2019-02-04 NOTE — Patient Instructions (Addendum)
-   Try to increase non-starchy vegetable intake with meals.   - Have carbohydrates with each meal.   - Try making/eating salad as side item for dinner.

## 2019-02-04 NOTE — Progress Notes (Signed)
  Medical Nutrition Therapy:  Appt start time: 3:20 end time:  4:10.   Assessment:  Primary concerns today: Pt arrives staring she is frustrated her BS numbers are still increasing. Pt reports numbers: FBS (218 - 246) and before dinner (267- 325); recent A1c was 9.5 decreased from previous 10.0 (reported 08/2018). Pt states she has been eating green beans sometimes.    Pt states she does not know what she is doing wrong. Pt states she is busy. Pt states she still has guilt and shame related to food due to comments made by mom, family and others. Pt states she will go out to eat alone and not tell her mom where she goes to eat to avoid negative comments. Pt states this is hard for her to discuss. Pt states she recently went out to eat and indulged with pasta, soup, and garlic bread.   Pt states she likes butternut squash, cauliflower (faux potatoes), broccoli with cheese or ranch, green beans, peas, beets as vegetable options. Pt loves veggie tots.    Pt states she feels discouraged because she works out a lot but she is still looks the way she does. Pt states she wishes she could take a pill to fix herself.   Next visit: discussion with mom   Preferred Learning Style:   No preference indicated   Learning Readiness:   Ready  Change in progress   MEDICATIONS: See list   DIETARY INTAKE:  Usual eating pattern includes 3 meals and 0 snacks per day.  Everyday foods include yogurt, nuts, berries, veggie tots, fried chicken.  Avoided foods include some vegetables.    24-hr recall:  B ( AM): high protein cereal + trail mix + berries + Fairlife milk Snk ( AM): none  L (1 PM): [employer]-low carb burger or PBJ sandwich + trail mix + cheese stick  Snk ( PM): trail mix + cheese D ( PM): 2 small fried chicken breasts + baked apples + veggie tots Snk ( PM): trail mix + greek yogurt Beverages: diet soda, Fair Life skim milk (in cereal), water (64 oz), sparkling water  Usual physical activity:  6 days/week, 45-60 min biking and treadmill  Estimated energy needs: 2000-2200 calories 225-248 g carbohydrates 150-165 g protein 56-61 g fat  Progress Towards Goal(s):  Some progress.   Nutritional Diagnosis:  NB-1.5 Disordered eating pattern As related to balanced meals.  As evidenced by pt reports indulging.    Intervention:  Nutrition education and counseling. Pt was educated and counseled on fiber intake and ways to increase it. Pt was encouraged to have balanced meals, how to do that when eating fast food, and will have discussion with mom at next visit. Pt was in agreement with goals listed.  Goals: - Try to increase non-starchy vegetable intake with meals.  - Have carbohydrates with each meal.  - Try making/eating salad as side item for dinner.   Teaching Method Utilized:  Visual Auditory Hands on  Handouts given during visit include:  none  Barriers to learning/adherence to lifestyle change: none identified  Demonstrated degree of understanding via:  Teach Back   Monitoring/Evaluation:  Dietary intake, exercise, and body weight in 1 month(s).

## 2019-03-11 ENCOUNTER — Ambulatory Visit: Payer: Self-pay | Admitting: Registered"

## 2019-03-13 ENCOUNTER — Ambulatory Visit: Payer: Medicare HMO | Admitting: Podiatry

## 2019-03-20 ENCOUNTER — Other Ambulatory Visit: Payer: Self-pay

## 2019-03-20 ENCOUNTER — Encounter: Payer: Self-pay | Admitting: Podiatry

## 2019-03-20 ENCOUNTER — Ambulatory Visit (INDEPENDENT_AMBULATORY_CARE_PROVIDER_SITE_OTHER): Payer: Medicare HMO | Admitting: Podiatry

## 2019-03-20 VITALS — Temp 97.2°F

## 2019-03-20 DIAGNOSIS — E119 Type 2 diabetes mellitus without complications: Secondary | ICD-10-CM | POA: Diagnosis not present

## 2019-03-20 DIAGNOSIS — L309 Dermatitis, unspecified: Secondary | ICD-10-CM | POA: Diagnosis not present

## 2019-03-20 DIAGNOSIS — B351 Tinea unguium: Secondary | ICD-10-CM

## 2019-03-20 DIAGNOSIS — M79609 Pain in unspecified limb: Secondary | ICD-10-CM | POA: Diagnosis not present

## 2019-03-20 MED ORDER — AMMONIUM LACTATE 12 % EX CREA: TOPICAL_CREAM | CUTANEOUS | 0 refills | Status: DC | PRN

## 2019-03-20 NOTE — Progress Notes (Signed)
Complaint:  Visit Type: Patient returns to my office for continued preventative foot care services. Complaint: Patient states" my nails have grown long and thick and become painful to walk and wear shoes" Patient has been diagnosed with DM with no foot complications.  Patient has CP. The patient presents for preventative foot care services. No changes to ROS  Podiatric Exam: Vascular: dorsalis pedis and posterior tibial pulses are palpable bilateral. Capillary return is immediate. Temperature gradient is WNL. Skin turgor WNL  Sensorium: Normal Semmes Weinstein monofilament test. Normal tactile sensation bilaterally. Nail Exam: Pt has thick disfigured discolored nails with subungual debris noted bilateral entire nail hallux through fifth toenails Ulcer Exam: There is no evidence of ulcer or pre-ulcerative changes or infection. Orthopedic Exam: Muscle tone and strength are WNL. No limitations in general ROM. No crepitus or effusions noted. Foot type and digits show no abnormalities. Bony prominences are unremarkable.  Pes planus  B/L. Skin: No Porokeratosis. No infection or ulcers.  Dry transverse fissures noted due to dry skin plantar aspect left foot.  Diagnosis:  Onychomycosis, , Pain in right toe, pain in left toes  Treatment & Plan Procedures and Treatment: Consent by patient was obtained for treatment procedures.   Debridement of mycotic and hypertrophic toenails, 1 through 5 bilateral and clearing of subungual debris. No ulceration, no infection noted. Told to use moisturizer. For plantar feet.  Prescribe lac-hydrin.  Prescribe formula 7. Return Visit-Office Procedure: Patient instructed to return to the office for a follow up visit 3 months for continued evaluation and treatment.    Helane Gunther DPM

## 2019-03-22 ENCOUNTER — Ambulatory Visit: Payer: Medicare HMO | Admitting: Podiatry

## 2019-03-26 ENCOUNTER — Encounter: Payer: Medicare HMO | Attending: Family Medicine | Admitting: Registered"

## 2019-03-26 ENCOUNTER — Other Ambulatory Visit: Payer: Self-pay

## 2019-03-26 ENCOUNTER — Encounter: Payer: Self-pay | Admitting: Registered"

## 2019-03-26 DIAGNOSIS — E119 Type 2 diabetes mellitus without complications: Secondary | ICD-10-CM | POA: Diagnosis not present

## 2019-03-26 DIAGNOSIS — Z713 Dietary counseling and surveillance: Secondary | ICD-10-CM | POA: Diagnosis not present

## 2019-03-26 NOTE — Patient Instructions (Addendum)
-   Can have trail mix as snack options between meals.   - Listen to your body for satisfaction.   [employer] job with being consistent, having 3 meals/day, and staying active! Keep it up!

## 2019-03-26 NOTE — Progress Notes (Signed)
  Medical Nutrition Therapy:  Appt start time: 3:00 end time:  4:00   Assessment:  Primary concerns today:   Pt arrives stating she has moved in with her parents about a month ago due to recent pandemic and not working. Pt states most of her readings are in the 100's since moving in with parents. Pt reports numbers: FBS (177-186) and after dinner (140). Pt states she has intentional about establishing consistency with having 3 meals a day and some snacks between. Pt reports eating more vegetables than usual, having a green vegetable nightly while mom is cooking. Pt has also continued with daily movement even though she misses gym being open. Walks 60 min, 7 days/week.   Pt states she misses snacking on trail mix (Omega-3 mix, 10 g CHO, 4 g PRO per serving). Mom does not want her snacking on them.   Mom states she is concerned about pts portion sizes and eating consistently throughout the day.   Pt states she likes butternut squash, cauliflower (faux potatoes), broccoli with cheese or ranch, green beans, peas, beets as vegetable options. Pt loves veggie tots.    Pt states she feels discouraged because she works out a lot but she is still looks the way she does. Pt states she wishes she could take a pill to fix herself.    Preferred Learning Style:   No preference indicated   Learning Readiness:   Ready  Change in progress   MEDICATIONS: See list   DIETARY INTAKE:  Usual eating pattern includes 3 meals and 0 snacks per day.  Everyday foods include yogurt, nuts, berries, veggie tots, fried chicken, green beans, steamed broccoli, snow peas. Avoided foods include some vegetables.    24-hr recall:  B (9 AM): grain berry cereal + pecans + grapes  Snk ( AM): none  L (12-1 PM): whole wheat bread + lettuce + deli meat + cheese + 10 pretzels + thin healthy mama brownie or [employer]-low carb burger or PBJ sandwich + trail mix + cheese stick  Snk ( PM): 10 grapes D (5-7 PM): brown rice +  meatloaf + green vegetable Snk ( PM): dessert Beverages: water, sparkling water, diet soda, Fair Life skim milk (in cereal), water (64 oz), sparkling water  Usual physical activity: 7 days/week, 60 min walking  Estimated energy needs: 2000-2200 calories 225-248 g carbohydrates 150-165 g protein 56-61 g fat  Progress Towards Goal(s):  Some progress.   Nutritional Diagnosis:  NB-1.5 Disordered eating pattern As related to balanced meals.  As evidenced by pt reports indulging.    Intervention:  Nutrition education and counseling. Pt was encouraged to keep up the great work with having vegetables daily, monitoring BS throughout the day, and being intentional about joyful movement. Pt and mom were educated and counseled importance of having consistency, listening to her body, not restricting, and the importance of focusing on managing diabetes well. Pt is doing well! Pt was in agreement with goals listed.  Goals: - Can have trail mix as snack options between meals.  - Listen to your body for satisfaction.  Randie He[employer] job with being consistent, having 3 meals/day, and staying active! Keep it up!  Teaching Method Utilized:  Visual Auditory Hands on  Handouts given during visit include:  none  Barriers to learning/adherence to lifestyle change: none identified  Demonstrated degree of understanding via:  Teach Back   Monitoring/Evaluation:  Dietary intake, exercise, and body weight in 1 month(s).

## 2019-03-27 ENCOUNTER — Ambulatory Visit: Payer: Self-pay | Admitting: Registered"

## 2019-04-19 DIAGNOSIS — E1165 Type 2 diabetes mellitus with hyperglycemia: Secondary | ICD-10-CM | POA: Diagnosis not present

## 2019-04-19 DIAGNOSIS — Z794 Long term (current) use of insulin: Secondary | ICD-10-CM | POA: Diagnosis not present

## 2019-04-19 DIAGNOSIS — E1121 Type 2 diabetes mellitus with diabetic nephropathy: Secondary | ICD-10-CM | POA: Diagnosis not present

## 2019-04-22 ENCOUNTER — Other Ambulatory Visit: Payer: Self-pay | Admitting: Podiatry

## 2019-04-24 ENCOUNTER — Encounter: Payer: Medicare HMO | Attending: Family Medicine | Admitting: Registered"

## 2019-04-24 ENCOUNTER — Other Ambulatory Visit: Payer: Self-pay

## 2019-04-24 ENCOUNTER — Encounter: Payer: Self-pay | Admitting: Registered"

## 2019-04-24 DIAGNOSIS — E119 Type 2 diabetes mellitus without complications: Secondary | ICD-10-CM | POA: Insufficient documentation

## 2019-04-24 DIAGNOSIS — Z713 Dietary counseling and surveillance: Secondary | ICD-10-CM | POA: Diagnosis not present

## 2019-04-24 NOTE — Progress Notes (Signed)
Medical Nutrition Therapy:  Appt start time: 3:08 end time:  4:00   Assessment:  Primary concerns today:   Pt arrives stating that she is still living at home with her parents while she is not working due to pandemic. Reports she is missing gathering with her church family and does not like wearing a mask. Also, states has not been able to exercise this week due to the rain and misses it. Pt states she had some blood work done on Fri but has not received results. Plans to stop by office after this appt to get an update.   Reports checking BS. FBS (117-224) and after meals (137-309). Pt states she has been snacking on trail mix (Omega-3 mix, 10 g CHO, 4 g PRO per serving).    Pt states she likes butternut squash, cauliflower (faux potatoes), broccoli with cheese or ranch, green beans, peas, beets as vegetable options. Pt loves veggie tots.    Pt states she feels discouraged because she works out a lot but she is still looks the way she does. Pt states she wishes she could take a pill to fix herself.    Preferred Learning Style:   No preference indicated   Learning Readiness:   Ready  Change in progress   MEDICATIONS: See list   DIETARY INTAKE:  Usual eating pattern includes 3 meals and 0 snacks per day.  Everyday foods include yogurt, nuts, berries, veggie tots, fried chicken, green beans, steamed broccoli, snow peas. Avoided foods include some vegetables.    24-hr recall:  B (9 AM): grain berry cereal + pecans + grapes  Snk ( AM): none  L (12-1 PM): whole wheat bread + lettuce + deli meat + cheese + 10 pretzels + thin healthy mama brownie or [employer]-low carb burger or PBJ sandwich + trail mix + cheese stick  Snk ( PM): 10 grapes D (5-7 PM): brown rice + meatloaf + green vegetable Snk ( PM): dessert Beverages: water, sparkling water, diet soda, Fair Life skim milk (in cereal), water (64 oz), sparkling water  Usual physical activity: 7 days/week, 60 min walking  Estimated  energy needs: 2000-2200 calories 225-248 g carbohydrates 150-165 g protein 56-61 g fat  Progress Towards Goal(s):  Some progress.   Nutritional Diagnosis:  NB-1.5 Disordered eating pattern As related to balanced meals.  As evidenced by pt reports indulging.    Intervention:  Nutrition education and counseling. Pt was encouraged to keep up the great work with having vegetables daily, monitoring BS throughout the day, and being intentional about joyful movement.   Teaching Method Utilized:  Visual Auditory Hands on  Handouts given during visit include:  none  Barriers to learning/adherence to lifestyle change: none identified  Demonstrated degree of understanding via:  Teach Back   Monitoring/Evaluation:  Dietary intake, exercise, and body weight in 1 month(s).

## 2019-05-23 ENCOUNTER — Encounter: Payer: Medicare HMO | Attending: Family Medicine | Admitting: Registered"

## 2019-05-23 ENCOUNTER — Other Ambulatory Visit: Payer: Self-pay

## 2019-05-23 ENCOUNTER — Encounter: Payer: Self-pay | Admitting: Registered"

## 2019-05-23 DIAGNOSIS — L723 Sebaceous cyst: Secondary | ICD-10-CM | POA: Diagnosis not present

## 2019-05-23 DIAGNOSIS — E119 Type 2 diabetes mellitus without complications: Secondary | ICD-10-CM | POA: Insufficient documentation

## 2019-05-23 DIAGNOSIS — Z713 Dietary counseling and surveillance: Secondary | ICD-10-CM | POA: Insufficient documentation

## 2019-05-23 NOTE — Patient Instructions (Signed)
-   Keep food diary  - Check blood sugars daily and record.

## 2019-05-23 NOTE — Progress Notes (Signed)
Medical Nutrition Therapy:  Appt start time: 2:08 end time:  3:01   Assessment:  Primary concerns today:   Pt states she has been keeping snacks on-hand. States she may be moving back into her own home soon. States her parents make comments to her about eating like "you're eating another meal. Didn't you just eat?". Pt states it makes her second guess her hunger. 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 Checking blood sugar once every other day: FBS (119-180) and after meals (189-210). Pt states she has been snacking on trail mix (Omega-3 mix, 10 g CHO, 4 g PRO per serving) sometimes.   Pt states it will be challenging for her to record food diary because of "bad" foods that may be on the list. States she knows there are no "bad" foods and there's no judgement but that this task will be hard for her.   Pt states she likes butternut squash, cauliflower (faux potatoes), broccoli with cheese or ranch, green beans, peas, beets as vegetable options. Pt loves veggie tots.    Pt states she feels discouraged because she works out a lot but she is still looks the way she does. Pt states she wishes she could take a pill to fix herself.    Preferred Learning Style:   No preference indicated   Learning Readiness:   Ready  Change in progress   MEDICATIONS: See list   DIETARY INTAKE:  Usual eating pattern includes 3 meals and 0 snacks per day.  Everyday foods include yogurt, nuts, berries, veggie tots, fried chicken, green beans, steamed broccoli, snow peas. Avoided foods include some vegetables.    24-hr recall:  B (9 AM): grain berry cereal + pecans + grapes  Snk ( AM): fruit + almonds + trail mix  L (12-1 PM): [employer]-1/2 apple pecan salad  Snk ( PM): 10 grapes D (5-7 PM): PB&J sandwich + pretzels + strawberries  Snk ( PM): none Beverages: water,  sparkling water, diet soda, Fair Life skim milk (in cereal), water (64 oz), sparkling water  Usual physical activity: 7 days/week, 60 min walking  Estimated energy needs: 2000-2200 calories 225-248 g carbohydrates 150-165 g protein 56-61 g fat  Progress Towards Goal(s):  Some progress.   Nutritional Diagnosis:  NB-1.5 Disordered eating pattern As related to balanced meals.  As evidenced by pt reports indulging.    Intervention:  Nutrition education and counseling. Pt was encouraged to keep up the great work with having vegetables daily, monitoring BS throughout the day, and being intentional about joyful movement. Discussed the the purpose of checking blood sugar daily and keeping food diary. Counseled pt on the feeling less shame and guilt related to food and portions. Pt was in agreement with goals listed.  Goals: - Keep food diary - Check blood sugars daily and record.   Teaching Method Utilized:  Visual Auditory Hands on  Handouts given during visit include:  none  Barriers to learning/adherence to lifestyle change: none identified  Demonstrated degree of understanding via:  Teach Back   Monitoring/Evaluation:  Dietary intake, exercise, and body weight in 1 month(s).

## 2019-06-10 ENCOUNTER — Other Ambulatory Visit: Payer: Self-pay | Admitting: Podiatry

## 2019-06-19 ENCOUNTER — Other Ambulatory Visit: Payer: Self-pay

## 2019-06-19 ENCOUNTER — Ambulatory Visit (INDEPENDENT_AMBULATORY_CARE_PROVIDER_SITE_OTHER): Payer: Medicare HMO | Admitting: Podiatry

## 2019-06-19 ENCOUNTER — Encounter: Payer: Self-pay | Admitting: Podiatry

## 2019-06-19 VITALS — Temp 98.6°F

## 2019-06-19 DIAGNOSIS — B351 Tinea unguium: Secondary | ICD-10-CM | POA: Diagnosis not present

## 2019-06-19 DIAGNOSIS — G809 Cerebral palsy, unspecified: Secondary | ICD-10-CM | POA: Diagnosis not present

## 2019-06-19 DIAGNOSIS — M79609 Pain in unspecified limb: Secondary | ICD-10-CM | POA: Diagnosis not present

## 2019-06-19 DIAGNOSIS — E119 Type 2 diabetes mellitus without complications: Secondary | ICD-10-CM

## 2019-06-19 NOTE — Progress Notes (Signed)
Complaint:  Visit Type: Patient returns to my office for continued preventative foot care services. Complaint: Patient states" my nails have grown long and thick and become painful to walk and wear shoes" Patient has been diagnosed with DM with no foot complications.  Patient has CP. The patient presents for preventative foot care services. No changes to ROS  Podiatric Exam: Vascular: dorsalis pedis and posterior tibial pulses are palpable bilateral. Capillary return is immediate. Temperature gradient is WNL. Skin turgor WNL  Sensorium: Normal Semmes Weinstein monofilament test. Normal tactile sensation bilaterally. Nail Exam: Pt has thick disfigured discolored nails with subungual debris noted bilateral entire nail hallux through fifth toenails Ulcer Exam: There is no evidence of ulcer or pre-ulcerative changes or infection. Orthopedic Exam: Muscle tone and strength are WNL. No limitations in general ROM. No crepitus or effusions noted. Foot type and digits show no abnormalities. Bony prominences are unremarkable.  Pes planus  B/L. Skin: No Porokeratosis. No infection or ulcers.  Dry transverse fissures resolved.  Diagnosis:  Onychomycosis, , Pain in right toe, pain in left toes  Treatment & Plan Procedures and Treatment: Consent by patient was obtained for treatment procedures.   Debridement of mycotic and hypertrophic toenails, 1 through 5 bilateral and clearing of subungual debris. No ulceration, no infection noted.  To call in future if she needs lac-hydrin. Return Visit-Office Procedure: Patient instructed to return to the office for a follow up visit 3 months for continued evaluation and treatment.    Gardiner Barefoot DPM

## 2019-06-20 ENCOUNTER — Ambulatory Visit: Payer: Medicare HMO | Admitting: Registered"

## 2019-06-27 ENCOUNTER — Encounter: Payer: Medicare HMO | Attending: Family Medicine | Admitting: Registered"

## 2019-06-27 ENCOUNTER — Other Ambulatory Visit: Payer: Self-pay

## 2019-06-27 ENCOUNTER — Encounter: Payer: Self-pay | Admitting: Registered"

## 2019-06-27 DIAGNOSIS — E119 Type 2 diabetes mellitus without complications: Secondary | ICD-10-CM | POA: Diagnosis not present

## 2019-06-27 DIAGNOSIS — Z713 Dietary counseling and surveillance: Secondary | ICD-10-CM | POA: Diagnosis not present

## 2019-06-27 NOTE — Progress Notes (Signed)
Medical Nutrition Therapy:  Appt start time: 3:00 end time:  4:00   Assessment:  Primary concerns today:   Reports walking 1.75 miles daily, 7 days/week. Has increased to checking BS daily: FBS (114-220). States she has been keeping food diary as well. Food diary shows consistent meals and snacks throughout the day. On days where BS numbers were elevated-pt had 3 doughnuts in 1 day or pound cake + burger + hot dog + baked beans. Pt states this was around her birthday and 4th of July weekend; her family grilled out and she went out to eat with friends. Pt states she wanted to hurry up and eat  doughnuts to try and hide them from her mom. States she does not want a conversation about food to begin around her mom due to potential negative comments. States dad made comments to her about eating a lot of processed food when recently helping her with grocery shopping earlier this week. States the comments upset her but she did not challenge them. States she tries not to internalize negative comments but its hard.  Reports she alternates between waffles and cereal to add variety to breakfast. States she can sense when full and satisfied better now. Reports eating about every 2 hours and keeping snacks in her bag; begins to eat nuts during our appt. States she eats serving size of food.  Previous appts: States her parents make comments to her about eating like "you're eating another meal. Didn't you just eat?". Pt states it makes her second guess her hunger. 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 Checking blood sugar once every other day: FBS (119-180) and after meals (189-210). Pt states she has been snacking on trail mix (Omega-3 mix, 10 g CHO, 4 g PRO per serving) sometimes.   Pt states it will be challenging for her to record food diary because of "bad" foods that  may be on the list. States she knows there are no "bad" foods and there's no judgement but that this task will be hard for her.   Pt states she likes butternut squash, cauliflower (faux potatoes), broccoli with cheese or ranch, green beans, peas, beets as vegetable options. Pt loves veggie tots.    Pt states she feels discouraged because she works out a lot but she is still looks the way she does. Pt states she wishes she could take a pill to fix herself.    Preferred Learning Style:   No preference indicated   Learning Readiness:   Ready  Change in progress   MEDICATIONS: See list   DIETARY INTAKE:  Usual eating pattern includes 3 meals and 0 snacks per day.  Everyday foods include yogurt, nuts, berries, veggie tots, fried chicken, green beans, steamed broccoli, snow peas. Avoided foods include some vegetables.    24-hr recall:  B (8 AM): 2 waffles + sugar-free syrup  Snk (10 AM): almonds + trail mix  L (12-1 PM): [employer]-low carb burger + pretzels Snk (2 PM): fruit + trail mix D (6-7 PM): PB&J sandwich + pretzels + strawberries or pork roast + rice + raw cauliflower + ranch  Snk ( PM): greek yogurt + fruit + nuts Beverages: water, sparkling water, diet soda, Fair Life skim milk (in cereal), water (64 oz), sparkling water  Usual physical activity: 7 days/week, 60 min walking  Estimated energy needs: 2000-2200 calories 225-248 g carbohydrates 150-165 g protein 56-61 g fat  Progress Towards Goal(s):  Some progress.   Nutritional Diagnosis:  NB-1.5 Disordered eating pattern As related to balanced meals.  As evidenced by pt reports indulging.    Intervention:  Nutrition education and counseling. Pt was encouraged to keep up the great work with having vegetables daily, monitoring BS throughout the day, and being intentional about joyful movement. Counseled pt on the feeling less shame and guilt related to food and portions. Discussed role and benefits of  Mental health  professional. Pt is doing well making changes towards managing diabetes and have more balanced meals/snacks. Pt was in agreement with goals listed.  Goals: - Look into getting established with a mental health professional - Have balanced meals with protein, carbohydrates, and non-starchy vegetables.  [employer]- Great job being intentional about balancing snacks, having vegetables, being active, checking blood sugars daily, and keeping snacks on-hand!  Teaching Method Utilized:  Visual Auditory Hands on  Handouts given during visit include:  none  Barriers to learning/adherence to lifestyle change: none identified  Demonstrated degree of understanding via:  Teach Back   Monitoring/Evaluation:  Dietary intake, exercise, and body weight in 1 month(s).

## 2019-06-27 NOTE — Patient Instructions (Addendum)
-   Look into getting established with a mental health professional  - Have balanced meals with protein, carbohydrates, and non-starchy vegetables.   Doristine Devoid job being intentional about balancing snacks, having vegetables, being active, checking blood sugars daily, and keeping snacks on-hand!

## 2019-07-22 DIAGNOSIS — L723 Sebaceous cyst: Secondary | ICD-10-CM | POA: Diagnosis not present

## 2019-07-22 DIAGNOSIS — E1121 Type 2 diabetes mellitus with diabetic nephropathy: Secondary | ICD-10-CM | POA: Diagnosis not present

## 2019-07-22 DIAGNOSIS — G8191 Hemiplegia, unspecified affecting right dominant side: Secondary | ICD-10-CM | POA: Diagnosis not present

## 2019-07-22 DIAGNOSIS — N181 Chronic kidney disease, stage 1: Secondary | ICD-10-CM | POA: Diagnosis not present

## 2019-07-22 DIAGNOSIS — G809 Cerebral palsy, unspecified: Secondary | ICD-10-CM | POA: Diagnosis not present

## 2019-07-22 DIAGNOSIS — E7800 Pure hypercholesterolemia, unspecified: Secondary | ICD-10-CM | POA: Diagnosis not present

## 2019-08-01 ENCOUNTER — Other Ambulatory Visit: Payer: Self-pay

## 2019-08-01 ENCOUNTER — Encounter: Payer: Self-pay | Admitting: Registered"

## 2019-08-01 ENCOUNTER — Encounter: Payer: Medicare HMO | Attending: Family Medicine | Admitting: Registered"

## 2019-08-01 DIAGNOSIS — E119 Type 2 diabetes mellitus without complications: Secondary | ICD-10-CM | POA: Diagnosis not present

## 2019-08-01 DIAGNOSIS — Z713 Dietary counseling and surveillance: Secondary | ICD-10-CM | POA: Diagnosis not present

## 2019-08-01 NOTE — Patient Instructions (Addendum)
-   Try to maneuver some evening snack options to earlier in the day. For example: Add fruit + nuts or greek yogurt  - Keep up the great work being active, checking blood sugars daily, and staying hydrated.

## 2019-08-01 NOTE — Progress Notes (Signed)
Medical Nutrition Therapy:  Appt start time: 3:07 end time:  4:00   Assessment:  Primary concerns today:   Pt arrives stating she has been checking BS daily: FBS (137- 189). On days when she eats evening snack of multiple items, FBS is elevated. On days when evening snack is nonexistent, FBS are closer to 130. Pt is recording food intake along with FBS numbers.   Previous appts: Reports eating multiple doughnuts to hde them from mom to avoid negative comments. States the comments upset her but she did not challenge them. States she tries not to internalize negative comments but its hard. States she eats serving size of food. Reports walking 1.75 miles daily, 7 days/week. States her parents make comments to her about eating like "you're eating another meal. Didn't you just eat?". Pt states it makes her second guess her hunger. 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 Checking blood sugar once every other day: FBS (119-180) and after meals (189-210). Pt states she has been snacking on trail mix (Omega-3 mix, 10 g CHO, 4 g PRO per serving) sometimes.   Pt states it will be challenging for her to record food diary because of "bad" foods that may be on the list. States she knows there are no "bad" foods and there's no judgement but that this task will be hard for her.   Pt states she likes butternut squash, cauliflower (faux potatoes), broccoli with cheese or ranch, green beans, peas, beets as vegetable options. Pt loves veggie tots.    Pt states she feels discouraged because she works out a lot but she is still looks the way she does. Pt states she wishes she could take a pill to fix herself.    Preferred Learning Style:   No preference indicated   Learning Readiness:   Ready  Change in progress   MEDICATIONS: See list   DIETARY INTAKE:  Usual  eating pattern includes 3 meals and 0 snacks per day.  Everyday foods include yogurt, nuts, berries, veggie tots, fried chicken, green beans, steamed broccoli, snow peas. Avoided foods include some vegetables.    24-hr recall:  B (8 AM): cereal + almonds + almonds + trail mix + almonds + grapes or 2 waffles + sugar-free syrup  Snk (10 AM): almonds + trail mix L (12-1 PM): grilled chicken sandwich + waffle fries Snk (2 PM): Chex mix D (6-7 PM): slice of chicken & bacon pizza + chicken caesar salad + baked apples or roast + rice + raw cauliflower + ranch  Snk ( PM): [place] yogurt + cereal + almonds + trail mix + strawberries + sugar free chocolate ice cream Beverages: water, sparkling water, diet soda, Fair Life skim milk (in cereal), water (64 oz)  Usual physical activity: 7 days/week, 80 min walking  Estimated energy needs: 2000-2200 calories 225-248 g carbohydrates 150-165 g protein 56-61 g fat  Progress Towards Goal(s):  Some progress.   Nutritional Diagnosis:  NB-1.5 Disordered eating pattern As related to balanced meals.  As evidenced by pt reports indulging.    Intervention:  Nutrition education and counseling. Pt was encouraged to keep up the great work with having vegetables daily, monitoring BS throughout the day, and being intentional about joyful movement. Pt is doing well making changes towards managing diabetes and have more balanced meals/snacks. Discussed how to  have more nutrition earlier in the day rather than later, adequately nourishing the body, and how bedtime snack affects FBS. Pt was in agreement with goals listed.  Goals: - Try to maneuver some evening snack options to earlier in the day. For example: Add fruit + nuts or greek yogurt - Keep up the great work being active, checking blood sugars daily, and staying hydrated.   Teaching Method Utilized:  Visual Auditory Hands on  Handouts given during visit include:  none  Barriers to learning/adherence to  lifestyle change: none identified  Demonstrated degree of understanding via:  Teach Back   Monitoring/Evaluation:  Dietary intake, exercise, and body weight in 1 month(s).

## 2019-08-12 DIAGNOSIS — R69 Illness, unspecified: Secondary | ICD-10-CM | POA: Diagnosis not present

## 2019-09-02 DIAGNOSIS — L7211 Pilar cyst: Secondary | ICD-10-CM | POA: Diagnosis not present

## 2019-09-02 DIAGNOSIS — L0889 Other specified local infections of the skin and subcutaneous tissue: Secondary | ICD-10-CM | POA: Diagnosis not present

## 2019-09-05 ENCOUNTER — Encounter: Payer: Self-pay | Admitting: Registered"

## 2019-09-05 ENCOUNTER — Encounter: Payer: Medicare HMO | Attending: Family Medicine | Admitting: Registered"

## 2019-09-05 ENCOUNTER — Other Ambulatory Visit: Payer: Self-pay

## 2019-09-05 DIAGNOSIS — Z713 Dietary counseling and surveillance: Secondary | ICD-10-CM | POA: Diagnosis not present

## 2019-09-05 DIAGNOSIS — E119 Type 2 diabetes mellitus without complications: Secondary | ICD-10-CM | POA: Diagnosis not present

## 2019-09-05 NOTE — Patient Instructions (Addendum)
-   Increase vegetable intake with lunch.   - Keep up the great work having vegetables with dinner and being physically active!

## 2019-09-05 NOTE — Progress Notes (Signed)
Medical Nutrition Therapy:  Appt start time: 2:05 end time:  3:05   Assessment:  Primary concerns today:   Pt arrives stating she has been checking BS daily: FBS (133-197). BS record shows increase in daily readings around 09/10 due to pt eating pizza and cookies in a short span of time with goal of eating them before parents visited. Also, an increase in numbers around 09/25 when dad was admitted into hospital for heart concerns.   Previous appts:  On days when she eats evening snack of multiple items, FBS is elevated. On days when evening snack is nonexistent, FBS are closer to 130. Pt is recording food intake along with FBS numbers. Reports eating multiple doughnuts to hde them from mom to avoid negative comments. States the comments upset her but she did not challenge them. States she tries not to internalize negative comments but its hard. States she eats serving size of food. Reports walking 1.75 miles daily, 7 days/week. States her parents make comments to her about eating like "you're eating another meal. Didn't you just eat?". Pt states it makes her second guess her hunger. 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 Checking blood sugar once every other day: FBS (119-180) and after meals (189-210). Pt states she has been snacking on trail mix (Omega-3 mix, 10 g CHO, 4 g PRO per serving) sometimes.   Pt states it will be challenging for her to record food diary because of "bad" foods that may be on the list. States she knows there are no "bad" foods and there's no judgement but that this task will be hard for her.   Pt states she likes butternut squash, cauliflower (faux potatoes), broccoli with cheese or ranch, green beans, peas, beets as vegetable options. Pt loves veggie tots.    Pt states she feels discouraged because she works out a lot but she is  still looks the way she does. Pt states she wishes she could take a pill to fix herself.    Preferred Learning Style:   No preference indicated   Learning Readiness:   Ready  Change in progress   MEDICATIONS: See list   DIETARY INTAKE:  Usual eating pattern includes 3 meals and 0 snacks per day.  Everyday foods include yogurt, nuts, berries, veggie tots, fried chicken, green beans, steamed broccoli, snow peas. Avoided foods include some vegetables.    24-hr recall:  B (8 AM): cereal + almonds + almonds + trail mix + almonds + grapes or 2 waffles + sugar-free syrup  Snk (10 AM): almonds + trail mix L (12-1 PM): grilled chicken sandwich + waffle fries Snk (2 PM): Chex mix D (6-7 PM): slice of chicken & bacon pizza + chicken caesar salad + baked apples or roast + rice + raw cauliflower + ranch  Snk ( PM): [place] yogurt + cereal + almonds + trail mix + strawberries + sugar free chocolate ice cream Beverages: water, sparkling water, diet soda, Fair Life skim milk (in cereal), water (64 oz)  Usual physical activity: 7 days/week, 80 min walking  Estimated energy needs: 2000-2200 calories 225-248 g carbohydrates 150-165 g protein 56-61 g fat  Progress Towards Goal(s):  Some progress.   Nutritional Diagnosis:  NB-1.5 Disordered eating pattern As related to balanced meals.  As evidenced by pt reports indulging.    Intervention:  Nutrition education and counseling. Pt was encouraged to keep up the great work with having vegetables daily, monitoring BS throughout the day, and being intentional about joyful movement. Pt is also educated on how to create balanced with meals and increasing vegetable intake with lunch. Pt was in agreement with goals listed.  Goals: - Increase vegetable intake with lunch.  - Keep up the great work having vegetables with dinner and being physically active!  Teaching Method Utilized:  Visual Auditory Hands on  Handouts given during visit  include:  none  Barriers to learning/adherence to lifestyle change: none identified  Demonstrated degree of understanding via:  Teach Back   Monitoring/Evaluation:  Dietary intake, exercise, and body weight in 1 month(s).

## 2019-09-16 DIAGNOSIS — L7211 Pilar cyst: Secondary | ICD-10-CM | POA: Diagnosis not present

## 2019-09-25 ENCOUNTER — Ambulatory Visit (INDEPENDENT_AMBULATORY_CARE_PROVIDER_SITE_OTHER): Payer: Medicare HMO | Admitting: Podiatry

## 2019-09-25 ENCOUNTER — Other Ambulatory Visit: Payer: Self-pay

## 2019-09-25 ENCOUNTER — Encounter: Payer: Self-pay | Admitting: Podiatry

## 2019-09-25 DIAGNOSIS — G809 Cerebral palsy, unspecified: Secondary | ICD-10-CM

## 2019-09-25 DIAGNOSIS — B351 Tinea unguium: Secondary | ICD-10-CM | POA: Diagnosis not present

## 2019-09-25 DIAGNOSIS — E119 Type 2 diabetes mellitus without complications: Secondary | ICD-10-CM

## 2019-09-25 DIAGNOSIS — M79609 Pain in unspecified limb: Secondary | ICD-10-CM

## 2019-09-25 DIAGNOSIS — M79676 Pain in unspecified toe(s): Secondary | ICD-10-CM

## 2019-09-25 NOTE — Progress Notes (Signed)
Complaint:  Visit Type: Patient returns to my office for continued preventative foot care services. Complaint: Patient states" my nails have grown long and thick and become painful to walk and wear shoes" Patient has been diagnosed with DM with no foot complications.  Patient has CP. The patient presents for preventative foot care services. No changes to ROS  Podiatric Exam: Vascular: dorsalis pedis and posterior tibial pulses are palpable bilateral. Capillary return is immediate. Temperature gradient is WNL. Skin turgor WNL  Sensorium: Normal Semmes Weinstein monofilament test. Normal tactile sensation bilaterally. Nail Exam: Pt has thick disfigured discolored nails with subungual debris noted bilateral entire nail hallux through fifth toenails Ulcer Exam: There is no evidence of ulcer or pre-ulcerative changes or infection. Orthopedic Exam: Muscle tone and strength are WNL. No limitations in general ROM. No crepitus or effusions noted. Foot type and digits show no abnormalities. Bony prominences are unremarkable.  Pes planus  B/L. Skin: No Porokeratosis. No infection or ulcers.  Dry transverse fissures resolved.  Diagnosis:  Onychomycosis, , Pain in right toe, pain in left toes  Treatment & Plan Procedures and Treatment: Consent by patient was obtained for treatment procedures.   Debridement of mycotic and hypertrophic toenails, 1 through 5 bilateral and clearing of subungual debris. No ulceration, no infection noted.  Patient had difficulty with dremel usage. Today. Return Visit-Office Procedure: Patient instructed to return to the office for a follow up visit 3 months for continued evaluation and treatment.    Gardiner Barefoot DPM

## 2019-10-10 ENCOUNTER — Encounter: Payer: Self-pay | Admitting: Registered"

## 2019-10-10 ENCOUNTER — Other Ambulatory Visit: Payer: Self-pay

## 2019-10-10 ENCOUNTER — Encounter: Payer: Medicare HMO | Attending: Family Medicine | Admitting: Registered"

## 2019-10-10 DIAGNOSIS — Z713 Dietary counseling and surveillance: Secondary | ICD-10-CM | POA: Insufficient documentation

## 2019-10-10 DIAGNOSIS — E1165 Type 2 diabetes mellitus with hyperglycemia: Secondary | ICD-10-CM

## 2019-10-10 DIAGNOSIS — E119 Type 2 diabetes mellitus without complications: Secondary | ICD-10-CM | POA: Insufficient documentation

## 2019-10-10 NOTE — Patient Instructions (Signed)
-   Check in with self after meals and snacks for hunger, satisfaction, or fullness.

## 2019-10-10 NOTE — Progress Notes (Signed)
Medical Nutrition Therapy:  Appt start time: 2:05 end time: 3:00   Assessment:  Primary concerns today:   Pt arrives stating she has been checking BS daily: FBS (158-295). Reports reveals pt BS have been higher lately. Pt states she is stressed about work, currently looking for a job and interviewing with prospective employers.   Previous appts:  On days when she eats evening snack of multiple items, FBS is elevated. On days when evening snack is nonexistent, FBS are closer to 130. Pt is recording food intake along with FBS numbers. Reports eating multiple doughnuts to hde them from mom to avoid negative comments. States the comments upset her but she did not challenge them. States she tries not to internalize negative comments but its hard. States she eats serving size of food. Reports walking 1.75 miles daily, 7 days/week. States her parents make comments to her about eating like "you're eating another meal. Didn't you just eat?". Pt states it makes her second guess her hunger. 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 Checking blood sugar once every other day: FBS (119-180) and after meals (189-210). Pt states she has been snacking on trail mix (Omega-3 mix, 10 g CHO, 4 g PRO per serving) sometimes.   Pt states it will be challenging for her to record food diary because of "bad" foods that may be on the list. States she knows there are no "bad" foods and there's no judgement but that this task will be hard for her.   Pt states she likes butternut squash, cauliflower (faux potatoes), broccoli with cheese or ranch, green beans, peas, beets as vegetable options. Pt loves veggie tots.    Pt states she feels discouraged because she works out a lot but she is still looks the way she does. Pt states she wishes she could take a pill to fix herself.    Preferred  Learning Style:   No preference indicated   Learning Readiness:   Ready  Change in progress   MEDICATIONS: See list   DIETARY INTAKE:  Usual eating pattern includes 3 meals and 0 snacks per day.  Everyday foods include yogurt, nuts, berries, veggie tots, fried chicken, green beans, steamed broccoli, snow peas. Avoided foods include some vegetables.    24-hr recall:  B (8 AM): cereal + almonds + grapes + milk Snk (10 AM): almonds + grapes L (12-1 PM): cranberry walnut salad with raspberry vinaigrette salad dressing   Snk (2 PM): cheese stick + almonds D (6-7 PM): veggie lasagna + avocado ranch salad with chicken + broccoli and cheese sauce Snk ( PM): [place] yogurt + blueberries + grapes + cereal + almonds  Beverages: water, sparkling water, diet soda, Fair Life skim milk (in cereal), water (64 oz)  Usual physical activity: 7 days/week, 80 min walking  Estimated energy needs: 2000-2200 calories 225-248 g carbohydrates 150-165 g protein 56-61 g fat  Progress Towards Goal(s):  Some progress.   Nutritional Diagnosis:  NB-1.5 Disordered eating pattern As related to balanced meals.  As evidenced by pt reports indulging.    Intervention:  Nutrition education and counseling. Pt was encouraged to keep up the great work with having vegetables daily, monitoring BS throughout the day, and being intentional about joyful movement. Discussed how amounts eaten during meals/snacks and checking in with hunger/satsifaction cues. Pt was in agreement  with goals listed.  Goals: - Check in with self after meals and snacks for hunger, satisfaction, or fullness.   Teaching Method Utilized:  Visual Auditory Hands on  Handouts given during visit include:  none  Barriers to learning/adherence to lifestyle change: none identified  Demonstrated degree of understanding via:  Teach Back   Monitoring/Evaluation:  Dietary intake, exercise, and body weight in 1 month(s).

## 2019-10-28 DIAGNOSIS — H5213 Myopia, bilateral: Secondary | ICD-10-CM | POA: Diagnosis not present

## 2019-10-28 DIAGNOSIS — Z794 Long term (current) use of insulin: Secondary | ICD-10-CM | POA: Diagnosis not present

## 2019-10-28 DIAGNOSIS — H52203 Unspecified astigmatism, bilateral: Secondary | ICD-10-CM | POA: Diagnosis not present

## 2019-10-28 DIAGNOSIS — E119 Type 2 diabetes mellitus without complications: Secondary | ICD-10-CM | POA: Diagnosis not present

## 2019-11-07 ENCOUNTER — Encounter: Payer: Medicare HMO | Attending: Family Medicine | Admitting: Registered"

## 2019-11-07 ENCOUNTER — Other Ambulatory Visit: Payer: Self-pay

## 2019-11-07 ENCOUNTER — Encounter: Payer: Self-pay | Admitting: Registered"

## 2019-11-07 DIAGNOSIS — E119 Type 2 diabetes mellitus without complications: Secondary | ICD-10-CM | POA: Diagnosis not present

## 2019-11-07 DIAGNOSIS — Z713 Dietary counseling and surveillance: Secondary | ICD-10-CM | POA: Diagnosis not present

## 2019-11-07 DIAGNOSIS — E1165 Type 2 diabetes mellitus with hyperglycemia: Secondary | ICD-10-CM

## 2019-11-07 NOTE — Patient Instructions (Addendum)
-   Reduce snack sizes back to 1-2 items such as fruit + nuts or greek yogurt or cheese stick + fruit or etc.   - Continue to practice listening to your body for satisfaction cues when eating meal/snack.   - Keep up the great work with being active, having vegetables with lunch and dinner, having 3 meals a day, and checking blood sugar daily.

## 2019-11-07 NOTE — Progress Notes (Signed)
Medical Nutrition Therapy:  Appt start time: 3:40 end time: 4:57   Assessment:  Primary concerns today:   States she has learned how to space out meals and not able to sense when body is hungry. States she can sense satisfaction a little bit. Reports checking BS once a day: FBS (141-302). Reports increased portion sizes for snack options. Still unable to work due to pandemic but walking daily for physical activity.   Previous appts: BS daily: FBS (158-295). On days when she eats evening snack of multiple items, FBS is elevated. On days when evening snack is nonexistent, FBS are closer to 130. 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   Pt states she likes butternut squash, cauliflower (faux potatoes), broccoli with cheese or ranch, green beans, peas, beets as vegetable options. Pt loves veggie tots.     Preferred Learning Style:   No preference indicated   Learning Readiness:   Ready  Change in progress   MEDICATIONS: See list   DIETARY INTAKE:  Usual eating pattern includes 3 meals and 0 snacks per day.  Everyday foods include yogurt, nuts, berries, veggie tots, fried chicken, green beans, steamed broccoli, snow peas. Avoided foods include some vegetables.    24-hr recall:  B (8 AM): cereal + almonds + grapes + milk Snk (10 AM): almonds + grapes L (12-1 PM): cranberry walnut salad with raspberry vinaigrette salad dressing   Snk (2 PM): cheese stick + almonds +grapes D (6-7 PM): veggie lasagna + avocado ranch salad with chicken + broccoli and cheese sauce Snk ( PM): [place] yogurt + blueberries + grapes + cereal + almonds  Beverages: water, sparkling water, diet soda, Fair Life skim milk (in cereal), water (64 oz)  Usual physical activity: 7 days/week, 30-80 min walking  Estimated energy needs: 2000-2200 calories 225-248 g  carbohydrates 150-165 g protein 56-61 g fat  Progress Towards Goal(s):  Some progress.   Nutritional Diagnosis:  NB-1.5 Disordered eating pattern As related to balanced meals.  As evidenced by pt reports indulging.    Intervention:  Nutrition education and counseling. Pt was encouraged to keep up the great work with having vegetables daily, monitoring BS throughout the day, and being intentional about joyful movement. Also encouraged to be mindful of snack options and how they may be affecting BS numbers. Discussed days where numbers were lower vs days when numbers were higher. Discussed continuing to practice checking in with hunger/satsifaction cues. Pt was in agreement with goals listed.  Goals: - Reduce snack sizes back to 1-2 items such as fruit + nuts or greek yogurt or cheese stick + fruit or etc.  - Continue to practice listening to your body for satisfaction cues when eating meal/snack.  - Keep up the great work with being active, having vegetables with lunch and dinner, having 3 meals a day, and checking blood sugar daily.   Teaching Method Utilized:  Visual Auditory Hands on  Handouts given during visit include:  none  Barriers to learning/adherence to lifestyle change: none identified  Demonstrated degree of understanding via:  Teach Back   Monitoring/Evaluation:  Dietary intake, exercise, and body weight in 1 month(s).

## 2019-11-08 ENCOUNTER — Other Ambulatory Visit: Payer: Self-pay | Admitting: Podiatry

## 2019-12-12 ENCOUNTER — Encounter: Payer: Medicare HMO | Attending: Family Medicine | Admitting: Registered"

## 2019-12-12 ENCOUNTER — Other Ambulatory Visit: Payer: Self-pay

## 2019-12-12 ENCOUNTER — Encounter: Payer: Self-pay | Admitting: Registered"

## 2019-12-12 DIAGNOSIS — Z713 Dietary counseling and surveillance: Secondary | ICD-10-CM | POA: Insufficient documentation

## 2019-12-12 DIAGNOSIS — E119 Type 2 diabetes mellitus without complications: Secondary | ICD-10-CM | POA: Diagnosis not present

## 2019-12-12 NOTE — Progress Notes (Signed)
Medical Nutrition Therapy:  Appt start time: 2:00 end time: 3:00  This note is not being shared with the patient for the following reason: To prevent harm (release of this note would result in harm to the life or physical safety of the patient or another).  Assessment:  Primary concerns today: Pt arrives stating she has continued to check her BS daily: FBS (224-341). States she is continuing to walk daily.  States she can sense satisfaction a little bit. Reports increased portion sizes for snack options. Still unable to work due to pandemic and actively seeking work.   Previous appts: On days when she eats evening snack of multiple items, FBS is elevated. On days when evening snack is nonexistent, FBS are closer to 130. 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   Pt states she likes butternut squash, cauliflower (faux potatoes), broccoli with cheese or ranch, green beans, peas, beets as vegetable options. Pt loves veggie tots.     Preferred Learning Style:   No preference indicated   Learning Readiness:   Ready  Change in progress   MEDICATIONS: See list   DIETARY INTAKE:  Usual eating pattern includes 3 meals and 0 snacks per day.  Everyday foods include yogurt, nuts, berries, veggie tots, fried chicken, green beans, steamed broccoli, snow peas. Avoided foods include some vegetables.    24-hr recall:  B (8 AM): 2 waffles + sugar free syrup Snk (10 AM): cereal + almonds + trail mix + grapes L (12-1 PM): [place] salad with blue cheese salad dressing + frosted trail mix + grapes Snk (2 PM): cheese stick + trail mix + almonds D (6-7 PM): PBJ sandwich + chips + grapes  Snk ( PM): cookies  Beverages: water, sparkling water, diet soda, Fair Life skim milk (in cereal), water (64 oz)  Usual physical activity: 7 days/week, 30-80 min  walking  Estimated energy needs: 2000-2200 calories 225-248 g carbohydrates 150-165 g protein 56-61 g fat  Progress Towards Goal(s):  Some progress.   Nutritional Diagnosis:  NB-1.5 Disordered eating pattern As related to balanced meals.  As evidenced by pt reports indulging.    Intervention:  Nutrition education and counseling. Pt was encouraged to keep up the great work with having vegetables daily, monitoring BS throughout the day, and being intentional about joyful movement. Also encouraged to be mindful of snack options and how they may be affecting BS numbers. Discussed continuing to practice checking in with hunger/satsifaction cues and will try reducing some snack options. Pt was in agreement with goals listed.  Goals: - Aim to have 2 food groups for snacks such as cereal + nuts or fruit + nuts or trail mix + nuts.  - Add protein to breakfast on days when having waffles.   Teaching Method Utilized:  Visual Auditory Hands on  Handouts given during visit include:  none  Barriers to learning/adherence to lifestyle change: none identified  Demonstrated degree of understanding via:  Teach Back   Monitoring/Evaluation:  Dietary intake, exercise, and body weight in 1 month(s).

## 2019-12-12 NOTE — Patient Instructions (Signed)
-   Aim to have 2 food groups for snacks such as cereal + nuts or fruit + nuts or trail mix + nuts.   - Add protein to breakfast on days when having waffles.

## 2019-12-25 ENCOUNTER — Ambulatory Visit (INDEPENDENT_AMBULATORY_CARE_PROVIDER_SITE_OTHER): Payer: Medicare HMO | Admitting: Podiatry

## 2019-12-25 ENCOUNTER — Other Ambulatory Visit: Payer: Self-pay

## 2019-12-25 ENCOUNTER — Encounter: Payer: Self-pay | Admitting: Podiatry

## 2019-12-25 DIAGNOSIS — M79676 Pain in unspecified toe(s): Secondary | ICD-10-CM | POA: Diagnosis not present

## 2019-12-25 DIAGNOSIS — E119 Type 2 diabetes mellitus without complications: Secondary | ICD-10-CM

## 2019-12-25 DIAGNOSIS — B351 Tinea unguium: Secondary | ICD-10-CM

## 2019-12-25 DIAGNOSIS — G809 Cerebral palsy, unspecified: Secondary | ICD-10-CM | POA: Diagnosis not present

## 2019-12-25 NOTE — Progress Notes (Signed)
Complaint:  Visit Type: Patient returns to my office for continued preventative foot care services. Complaint: Patient states" my nails have grown long and thick and become painful to walk and wear shoes" Patient has been diagnosed with DM with no foot complications.  Patient has CP. The patient presents for preventative foot care services. No changes to ROS.  No dremel use for this patient.  Podiatric Exam: Vascular: dorsalis pedis and posterior tibial pulses are palpable bilateral. Capillary return is immediate. Temperature gradient is WNL. Skin turgor WNL  Sensorium: Normal Semmes Weinstein monofilament test. Normal tactile sensation bilaterally. Nail Exam: Pt has thick disfigured discolored nails with subungual debris noted bilateral entire nail hallux through fifth toenails Ulcer Exam: There is no evidence of ulcer or pre-ulcerative changes or infection. Orthopedic Exam: Muscle tone and strength are WNL. No limitations in general ROM. No crepitus or effusions noted. Foot type and digits show no abnormalities. Bony prominences are unremarkable.  Pes planus  B/L. Skin: No Porokeratosis. No infection or ulcers.  Dry transverse fissures resolved.  Diagnosis:  Onychomycosis, , Pain in right toe, pain in left toes  Treatment & Plan Procedures and Treatment: Consent by patient was obtained for treatment procedures.   Debridement of mycotic and hypertrophic toenails, 1 through 5 bilateral and clearing of subungual debris. No ulceration, no infection noted.   Return Visit-Office Procedure: Patient instructed to return to the office for a follow up visit 3 months for continued evaluation and treatment.    Helane Gunther DPM

## 2019-12-30 ENCOUNTER — Other Ambulatory Visit: Payer: Self-pay | Admitting: Podiatry

## 2020-01-09 ENCOUNTER — Encounter: Payer: Self-pay | Admitting: Registered"

## 2020-01-09 ENCOUNTER — Encounter: Payer: Medicare HMO | Attending: Family Medicine | Admitting: Registered"

## 2020-01-09 ENCOUNTER — Other Ambulatory Visit: Payer: Self-pay

## 2020-01-09 DIAGNOSIS — Z713 Dietary counseling and surveillance: Secondary | ICD-10-CM | POA: Insufficient documentation

## 2020-01-09 DIAGNOSIS — E119 Type 2 diabetes mellitus without complications: Secondary | ICD-10-CM | POA: Diagnosis not present

## 2020-01-09 NOTE — Progress Notes (Signed)
Medical Nutrition Therapy:  Appt start time: 2:00 end time: 3:02  This note is not being shared with the patient for the following reason: To prevent harm (release of this note would result in harm to the life or physical safety of the patient or another).  Assessment:  Primary concerns today: Pt arrives stating she has continued to check her BS daily: FBS (196-331). States she has not had A1c recently, unsure when the next one will be. States she has interview with company coming up. Misses working at [employer]. Continues to be active during the week by walking. States she can sense satisfaction when eating.   Previous appts: On days when she eats evening snack of multiple items, FBS is elevated. On days when evening snack is nonexistent, FBS are closer to 130. 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   Pt states she likes butternut squash, cauliflower (faux potatoes), broccoli with cheese or ranch, green beans, peas, beets as vegetable options. Pt loves veggie tots.     Preferred Learning Style:   No preference indicated   Learning Readiness:   Ready  Change in progress   MEDICATIONS: See list   DIETARY INTAKE:  Usual eating pattern includes 3 meals and 0 snacks per day.  Everyday foods include yogurt, nuts, berries, veggie tots, fried chicken, green beans, steamed broccoli, snow peas. Avoided foods include some vegetables.    24-hr recall:  B (8 AM): 2 waffles + sugar free syrup + trail mix + almonds Snk (10 AM): cereal + almonds + trail mix + grapes L (12-1 PM): [place] salad with blue cheese salad dressing + peanut butter sandwich + pretzels + grapes Snk (2 PM): trail mix + almonds D (6-7 PM): veggie lasagna + spinach salad (with vinaigrette salad dressing) + foe tatoes + baked apples Snk ( PM): none  Beverages: water, sparkling water,  diet soda, Fair Life skim milk (in cereal), water (64 oz)  Usual physical activity: 7 days/week, 30-80 min walking  Estimated energy needs: 2000-2200 calories 225-248 g carbohydrates 150-165 g protein 56-61 g fat  Progress Towards Goal(s):  Some progress.   Nutritional Diagnosis:  NB-1.5 Disordered eating pattern As related to balanced meals.  As evidenced by pt reports indulging.    Intervention:  Nutrition education and counseling. Pt was encouraged to keep up the great work with having vegetables daily, monitoring BS throughout the day, and being intentional about joyful movement. Also encouraged to be mindful of snack options and how they may be affecting BS numbers. Discussed continuing to practice checking in with hunger/satsifaction cues and will try reducing some snack options; after dinner snack. Pt was in agreement with goals listed.    Teaching Method Utilized:  Visual Auditory Hands on  Handouts given during visit include:  none  Barriers to learning/adherence to lifestyle change: none identified  Demonstrated degree of understanding via:  Teach Back   Monitoring/Evaluation:  Dietary intake, exercise, and body weight in 1 month(s).

## 2020-01-13 DIAGNOSIS — R69 Illness, unspecified: Secondary | ICD-10-CM | POA: Diagnosis not present

## 2020-01-24 ENCOUNTER — Other Ambulatory Visit: Payer: Self-pay | Admitting: Podiatry

## 2020-02-06 ENCOUNTER — Other Ambulatory Visit: Payer: Self-pay

## 2020-02-06 ENCOUNTER — Encounter: Payer: Medicare HMO | Attending: Family Medicine | Admitting: Registered"

## 2020-02-06 ENCOUNTER — Encounter: Payer: Self-pay | Admitting: Registered"

## 2020-02-06 DIAGNOSIS — Z713 Dietary counseling and surveillance: Secondary | ICD-10-CM | POA: Diagnosis not present

## 2020-02-06 DIAGNOSIS — E119 Type 2 diabetes mellitus without complications: Secondary | ICD-10-CM | POA: Insufficient documentation

## 2020-02-06 NOTE — Progress Notes (Signed)
Medical Nutrition Therapy:  Appt start time: 2:00 end time: 2:55  This note is not being shared with the patient for the following reason: To prevent harm (release of this note would result in harm to the life or physical safety of the patient or another).  Assessment: Primary concerns today: Pt arrives stating she has continued to check her BS daily: FBS (200-337). States she does not know what else to do to lower BS numbers. Reports increasing insulin units to 66 as of 3/1. BS still in 200's.   States she has an appt with PCP next week and will have A1c checked. States she is nervous about it because her BS numbers have not decreased.   States she will start a new job in a few weeks. Reports being glad to have income. States she is concerned about dad traveling, not being able to physically see extended family (uncle) due to pandemic and him being high-risk.   Previous appts: On days when she eats evening snack of multiple items, FBS is elevated. On days when evening snack is nonexistent, FBS are closer to 130. 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 Continues to be active during the week by walking. States she can sense satisfaction when eating.   Pt states she likes butternut squash, cauliflower (faux potatoes), broccoli with cheese or ranch, green beans, peas, beets as vegetable options. Pt loves veggie tots.     Preferred Learning Style:   No preference indicated   Learning Readiness:   Ready  Change in progress   MEDICATIONS: See list   DIETARY INTAKE:  Usual eating pattern includes 3 meals and 0 snacks per day.  Everyday foods include yogurt, nuts, berries, veggie tots, fried chicken, green beans, steamed broccoli, snow peas. Avoided foods include some vegetables.    24-hr recall: 2/3 B (8 AM): 2 BB waffles + sugar free syrup +  almonds Snk (10 AM): cereal + almonds + grapes L (12-1 PM): chef salad with creamy ranch dressing  Snk (2 PM): cheese stick + almonds + grapes D (6-7 PM): Subway-3" chicken sub (with spinach, olives, onions) + pretzels + grapes  Snk ( PM):   Beverages: water, sparkling water, diet soda, Fair Life skim milk (in cereal), water (64 oz)  Usual physical activity: 7 days/week, 30-80 min walking  Estimated energy needs: 2000-2200 calories 225-248 g carbohydrates 150-165 g protein 56-61 g fat  Progress Towards Goal(s):  Some progress.   Nutritional Diagnosis:  NB-1.5 Disordered eating pattern As related to balanced meals.  As evidenced by pt reports indulging.    Intervention:  Nutrition education and counseling. Pt was encouraged to keep up the great work with having vegetables daily, monitoring BS throughout the day, and being intentional about joyful movement. Discussed diabetes being a progressive condition and the variety of things that contribute to elevated numbers.   Teaching Method Utilized:  Visual Auditory Hands on  Handouts given during visit include:  none  Barriers to learning/adherence to lifestyle change: none identified  Demonstrated degree of understanding via:  Teach Back   Monitoring/Evaluation:  Dietary intake, exercise, and body weight in 1 month(s).

## 2020-02-10 DIAGNOSIS — Z23 Encounter for immunization: Secondary | ICD-10-CM | POA: Diagnosis not present

## 2020-02-10 DIAGNOSIS — E7800 Pure hypercholesterolemia, unspecified: Secondary | ICD-10-CM | POA: Diagnosis not present

## 2020-02-10 DIAGNOSIS — J309 Allergic rhinitis, unspecified: Secondary | ICD-10-CM | POA: Diagnosis not present

## 2020-02-10 DIAGNOSIS — K219 Gastro-esophageal reflux disease without esophagitis: Secondary | ICD-10-CM | POA: Diagnosis not present

## 2020-02-10 DIAGNOSIS — G809 Cerebral palsy, unspecified: Secondary | ICD-10-CM | POA: Diagnosis not present

## 2020-02-10 DIAGNOSIS — G8191 Hemiplegia, unspecified affecting right dominant side: Secondary | ICD-10-CM | POA: Diagnosis not present

## 2020-02-10 DIAGNOSIS — E1121 Type 2 diabetes mellitus with diabetic nephropathy: Secondary | ICD-10-CM | POA: Diagnosis not present

## 2020-02-10 DIAGNOSIS — Z0000 Encounter for general adult medical examination without abnormal findings: Secondary | ICD-10-CM | POA: Diagnosis not present

## 2020-02-10 DIAGNOSIS — N181 Chronic kidney disease, stage 1: Secondary | ICD-10-CM | POA: Diagnosis not present

## 2020-03-12 ENCOUNTER — Other Ambulatory Visit: Payer: Self-pay

## 2020-03-12 ENCOUNTER — Encounter: Payer: Medicare HMO | Attending: Family Medicine | Admitting: Registered"

## 2020-03-12 ENCOUNTER — Encounter: Payer: Self-pay | Admitting: Registered"

## 2020-03-12 DIAGNOSIS — Z713 Dietary counseling and surveillance: Secondary | ICD-10-CM | POA: Insufficient documentation

## 2020-03-12 DIAGNOSIS — E119 Type 2 diabetes mellitus without complications: Secondary | ICD-10-CM

## 2020-03-12 NOTE — Progress Notes (Signed)
Medical Nutrition Therapy:  Appt start time: 2:35 end time: 3:33  This note is not being shared with the patient for the following reason: To prevent harm (release of this note would result in harm to the life or physical safety of the patient or another).  Assessment: Primary concerns today:   States she recently had root canal done. Reports dizzy episodes after procedure. States she has been less active due to working new job but still tries to walk laps while at work. States she has been stressed and frustrated trying to meet the demands of trying to lose weight and lower blood sugar numbers.  Still checking BS daily: FBS (200-337). States she does not recall A1c from PCP appt.   Previous appts: On days when she eats evening snack of multiple items, FBS is elevated. On days when evening snack is nonexistent, FBS are closer to 130. 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 Continues to be active during the week by walking. States she can sense satisfaction when eating.   Pt states she likes butternut squash, cauliflower (faux potatoes), broccoli with cheese or ranch, green beans, peas, beets as vegetable options. Pt loves veggie tots.     Preferred Learning Style:   No preference indicated   Learning Readiness:   Ready  Change in progress   MEDICATIONS: See list   DIETARY INTAKE:  Usual eating pattern includes 3 meals and 2 snacks per day.  Everyday foods include yogurt, nuts, berries, veggie tots, fried chicken, green beans, steamed broccoli, snow peas. Avoided foods include some vegetables.    24-hr recall: 2/3 B (8 AM): 2 BB waffles + sugar free syrup + trail mix + cutie Snk (10 AM): almonds L (12-1 PM): Wendy's-junior bacon cheeseburger + french fries + chicken nuggets Snk (2 PM): cheese stick + almonds + grapes + trail mix +  cereal D (6-7 PM): veggie patty + veggie tots + mac and cheese + broccoli with cheese + baked apple slices   Snk (PM):   Beverages: water, sparkling water, diet soda, Fair Life skim milk (in cereal), water (64 oz)  Usual physical activity: walking while at work  Estimated energy needs: 2000-2200 calories 225-248 g carbohydrates 150-165 g protein 56-61 g fat  Progress Towards Goal(s):  Some progress.   Nutritional Diagnosis:  NB-1.5 Disordered eating pattern As related to balanced meals.  As evidenced by pt reports indulging.    Intervention:  Nutrition education and counseling. Pt was encouraged to keep up the great work with having vegetables daily, monitoring BS throughout the day, and trying to be intentional about joyful movement. Discussed meals and creating more balance with carbohydrate sources and other meal components: protein and non-starchy vegetables. Pt was in agreement with goal listed.  Goal: - Aim to have 30-45 grams of carbohydrates with each meal  Teaching Method Utilized:  Visual Auditory Hands on  Handouts given during visit include:  none  Barriers to learning/adherence to lifestyle change: none identified  Demonstrated degree of understanding via:  Teach Back   Monitoring/Evaluation:  Dietary intake, exercise, and body weight in 1 month(s).

## 2020-03-12 NOTE — Patient Instructions (Addendum)
-   Aim to have 30-45 grams of carbohydrates with each meal

## 2020-03-25 ENCOUNTER — Ambulatory Visit (INDEPENDENT_AMBULATORY_CARE_PROVIDER_SITE_OTHER): Payer: Medicare HMO | Admitting: Podiatry

## 2020-03-25 ENCOUNTER — Other Ambulatory Visit: Payer: Self-pay

## 2020-03-25 ENCOUNTER — Encounter: Payer: Self-pay | Admitting: Podiatry

## 2020-03-25 VITALS — Temp 97.3°F

## 2020-03-25 DIAGNOSIS — M79676 Pain in unspecified toe(s): Secondary | ICD-10-CM | POA: Diagnosis not present

## 2020-03-25 DIAGNOSIS — G809 Cerebral palsy, unspecified: Secondary | ICD-10-CM | POA: Diagnosis not present

## 2020-03-25 DIAGNOSIS — E119 Type 2 diabetes mellitus without complications: Secondary | ICD-10-CM | POA: Diagnosis not present

## 2020-03-25 DIAGNOSIS — B351 Tinea unguium: Secondary | ICD-10-CM | POA: Diagnosis not present

## 2020-03-25 NOTE — Progress Notes (Signed)
This patient returns to my office for at risk foot care.  This patient requires this care by a professional since this patient will be at risk due to having diabetes.  This patient is unable to cut nails herself since the patient cannot reach her nails.These nails are painful walking and wearing shoes.  This patient presents for at risk foot care today.  General Appearance  Alert, conversant and in no acute stress.  Vascular  Dorsalis pedis and posterior tibial  pulses are palpable  bilaterally.  Capillary return is within normal limits  bilaterally. Temperature is within normal limits  bilaterally.  Neurologic  Senn-Weinstein monofilament wire test within normal limits  bilaterally. Muscle power within normal limits bilaterally.  Nails Thick disfigured discolored nails with subungual debris  from hallux to fifth toes bilaterally. No evidence of bacterial infection or drainage bilaterally.  Orthopedic  No limitations of motion  feet .  No crepitus or effusions noted.  No bony pathology or digital deformities noted.  Skin  normotropic skin with no porokeratosis noted bilaterally.  No signs of infections or ulcers noted.     Onychomycosis  Pain in right toes  Pain in left toes  Consent was obtained for treatment procedures.   Mechanical debridement of nails 1-5  bilaterally performed with a nail nipper.  Patient requests no dremel tool usage.   Return office visit   3 months                   Told patient to return for periodic foot care and evaluation due to potential at risk complications.   Helane Gunther DPM

## 2020-04-09 DIAGNOSIS — R69 Illness, unspecified: Secondary | ICD-10-CM | POA: Diagnosis not present

## 2020-04-16 ENCOUNTER — Encounter: Payer: Medicare HMO | Attending: Family Medicine | Admitting: Registered"

## 2020-04-16 ENCOUNTER — Other Ambulatory Visit: Payer: Self-pay

## 2020-04-16 ENCOUNTER — Encounter: Payer: Self-pay | Admitting: Registered"

## 2020-04-16 DIAGNOSIS — E119 Type 2 diabetes mellitus without complications: Secondary | ICD-10-CM | POA: Diagnosis not present

## 2020-04-16 DIAGNOSIS — Z713 Dietary counseling and surveillance: Secondary | ICD-10-CM | POA: Insufficient documentation

## 2020-04-16 NOTE — Patient Instructions (Signed)
-   Check out Anti-Diet book.   - Get batteries for glucometer from nearby [facility]. Try to get at least 4 batteries.   - Try to walk on Sundays and Tuesdays.

## 2020-04-16 NOTE — Progress Notes (Signed)
Medical Nutrition Therapy:  Appt start time: 2:05 end time: 3:05  This note is not being shared with the patient for the following reason: To prevent harm (release of this note would result in harm to the life or physical safety of the patient or another).  Assessment: Primary concerns today:   States her batteries in glucometer are running low. States she does not know where to get more. Pt is stressed about having to get new batteries, how she will get them, who will take her, where to go, and how to replace them. Reports she has a new phone and has only been able to log for a few days. Reports she has not been able to figure out how to implement movement along with her new job. 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 Works Wed 9-11, Thurs 10-12 and volunteers a few hours on Fri and Sat.   Still checking BS daily: FBS (200-337).   Previous appts: States she does not recall A1c from PCP appt. On days when she eats evening snack of multiple items, FBS is elevated. On days when evening snack is nonexistent, FBS are closer to 130. 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 Continues to be active during the week by walking. States she can sense satisfaction when eating.   Pt states she likes butternut squash, cauliflower (faux potatoes), broccoli with cheese or ranch, green beans, peas, beets as vegetable options. Pt loves veggie tots.     Preferred Learning Style:   No preference indicated   Learning Readiness:   Ready  Change in progress   MEDICATIONS: See list   DIETARY INTAKE:  Usual eating pattern includes 3 meals and 2 snacks per day.  Everyday foods include yogurt, nuts, berries, veggie tots, fried chicken, green beans, steamed broccoli, snow peas. Avoided foods include some  vegetables.    24-hr recall: 2/3 B (8 AM): greek yogurt + granola + berries + breakfast bread  Snk (10 AM): cheetos L (12-1 PM): chicken salad + couscous Snk (2 PM): cereal + almonds + grapes D (6-7 PM): [place] pot pie + broccoli with cheese sauce + veggie tots + [place] yogurt + cereal + almonds + grapes   Snk (PM):   Beverages: water, sparkling water, diet soda, Fair Life skim milk (in cereal), water (64 oz)  Usual physical activity: walking while at work  Estimated energy needs: 2000-2200 calories 225-248 g carbohydrates 150-165 g protein 56-61 g fat  Progress Towards Goal(s):  Some progress.   Nutritional Diagnosis:  NB-1.5 Disordered eating pattern As related to balanced meals.  As evidenced by pt reports indulging.    Intervention:  Nutrition education and counseling. Discussed logistics of glucometer batteries: where to purchase near her home, pricing, and how to replace them. Discussed stress and its affect on BS numbers. Discussed ways to implement movement into her days and benefits of movement. Pt was in agreement with goal listed.  Goal: - Check out Anti-Diet book.  - Get batteries for glucometer from nearby [place]. Try to get at least 4 batteries.  - Try to walk on Sundays and Tuesdays.  Teaching Method Utilized:  Visual Auditory Hands on  Handouts given during visit include:  none  Barriers to learning/adherence to lifestyle change: none identified  Demonstrated degree  of understanding via:  Teach Back   Monitoring/Evaluation:  Dietary intake, exercise, and body weight in 1 month(s).

## 2020-05-11 DIAGNOSIS — E1121 Type 2 diabetes mellitus with diabetic nephropathy: Secondary | ICD-10-CM | POA: Diagnosis not present

## 2020-05-19 ENCOUNTER — Encounter: Payer: Medicare HMO | Attending: Family Medicine | Admitting: Registered"

## 2020-05-19 ENCOUNTER — Encounter: Payer: Self-pay | Admitting: Registered"

## 2020-05-19 ENCOUNTER — Other Ambulatory Visit: Payer: Self-pay

## 2020-05-19 DIAGNOSIS — Z713 Dietary counseling and surveillance: Secondary | ICD-10-CM

## 2020-05-19 DIAGNOSIS — E119 Type 2 diabetes mellitus without complications: Secondary | ICD-10-CM | POA: Insufficient documentation

## 2020-05-19 NOTE — Progress Notes (Signed)
Medical Nutrition Therapy:  Appt start time: 2:08 end time: 2:50  This note is not being shared with the patient for the following reason: To prevent harm (release of this note would result in harm to the life or physical safety of the patient or another).  Assessment: Primary concerns today:   States she eats beyond fullness on Wednesdays at church or after church. Reports they serve cookies and sometimes she will pack a bag of cookies to take home. States sometimes she will eat them that night or eat them another night.   Still checking BS daily: FBS (244-331). Replaced batteries in glucometer.   States she had her A1c checked on Mon, 6/14 but does not know what it is and has not checked it because she does not want to know what it is.   Previous appts: States she does not recall A1c from PCP appt. On days when she eats evening snack of multiple items, FBS is elevated. On days when evening snack is nonexistent, FBS are closer to 130. 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 Continues to be active during the week by walking. States she can sense satisfaction when eating.   Pt states she likes butternut squash, cauliflower (faux potatoes), broccoli with cheese or ranch, green beans, peas, beets as vegetable options. Pt loves veggie tots.     Preferred Learning Style:   No preference indicated   Learning Readiness:   Ready  Change in progress   MEDICATIONS: See list   DIETARY INTAKE:  Usual eating pattern includes 3 meals and 2 snacks per day.  Everyday foods include yogurt, nuts, berries, veggie tots, fried chicken, green beans, steamed broccoli, snow peas. Avoided foods include some vegetables.    24-hr recall: 2/3 B (8 AM): cereal + almonds + grapes + milk  Snk (10 AM): cereal + almonds + grapes L (12-1 PM): Wendy's - chicken apple  pecan salad  Snk (2 PM): cereal + almonds + grapes D (6-7 PM): Michel[employer] - mac and cheese + veggie patty + veggie tots + broccoli and cheese + baked apple slices    Snk (PM):   Beverages: water, sparkling water, diet soda, Fair Life skim milk (in cereal), water (64 oz)  Usual physical activity: walking while at work  Estimated energy needs: 2000-2200 calories 225-248 g carbohydrates 150-165 g protein 56-61 g fat  Progress Towards Goal(s):  Some progress.   Nutritional Diagnosis:  NB-1.5 Disordered eating pattern As related to balanced meals.  As evidenced by pt reports indulging.    Intervention:  Nutrition education and counseling. Mainly listened and discussed normalizing food to help with binge episodes. Pt was in agreement with goal listed.  Goal: - Have 30 - 45 grams of carbohydrates per meal.   Teaching Method Utilized:  Visual Auditory Hands on  Handouts given during visit include:  none  Barriers to learning/adherence to lifestyle change: none identified  Demonstrated degree of understanding via:  Teach Back   Monitoring/Evaluation:  Dietary intake, exercise, and body weight in 1 month(s).

## 2020-05-19 NOTE — Patient Instructions (Signed)
-   Have 30 - 45 grams of carbohydrates per meal.

## 2020-06-30 ENCOUNTER — Encounter: Payer: Self-pay | Admitting: Registered"

## 2020-06-30 ENCOUNTER — Encounter: Payer: Medicare HMO | Attending: Family Medicine | Admitting: Registered"

## 2020-06-30 ENCOUNTER — Other Ambulatory Visit: Payer: Self-pay

## 2020-06-30 DIAGNOSIS — E119 Type 2 diabetes mellitus without complications: Secondary | ICD-10-CM | POA: Diagnosis not present

## 2020-06-30 DIAGNOSIS — Z713 Dietary counseling and surveillance: Secondary | ICD-10-CM | POA: Diagnosis not present

## 2020-06-30 NOTE — Progress Notes (Signed)
Medical Nutrition Therapy:  Appt start time: 2:08 end time: 2:56  This note is not being shared with the patient for the following reason: To prevent harm (release of this note would result in harm to the life or physical safety of the patient or another).  Assessment: Primary concerns today:   States she may need to have 2 root canals. States she is still checking BS daily: FBS (280-424). States she has recently been out of work for the past 2 weeks. Appears to be slightly stressed from this. Reports she is unsure of when she will return to work. Reports she is doing things to stay active - walking, visiting family, and volunteering on the weekends.   Previous appts: States she does not recall A1c from PCP appt. On days when she eats evening snack of multiple items, FBS is elevated. On days when evening snack is nonexistent, FBS are closer to 130. 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 Continues to be active during the week by walking. States she can sense satisfaction when eating.   Pt states she likes butternut squash, cauliflower (faux potatoes), broccoli with cheese or ranch, green beans, peas, beets as vegetable options. Pt loves veggie tots.     Preferred Learning Style:   No preference indicated   Learning Readiness:   Ready  Change in progress   MEDICATIONS: See list   DIETARY INTAKE:  Usual eating pattern includes 3 meals and 2 snacks per day.  Everyday foods include yogurt, nuts, berries, veggie tots, fried chicken, green beans, steamed broccoli, snow peas. Avoided foods include some vegetables.    24-hr recall:  B (8 AM): cereal + almonds + grapes + milk  Snk (10 AM): cereal + almonds + grapes L (12-1 PM): salad bowl + PBJ sandwich  Snk (2 PM): cereal + almonds + grapes D (6-7 PM): [employer] - pizza bites TV dinner +  veggie tots + foetatoes + [place] yogurt + cereal + almonds + grapes    Snk (PM):   Beverages: water, sparkling water, diet soda, Fair Life skim milk (in cereal), water (64 oz)  Usual physical activity: walking while at work  Estimated energy needs: 2000-2200 calories 225-248 g carbohydrates 150-165 g protein 56-61 g fat  Progress Towards Goal(s):  Some progress.   Nutritional Diagnosis:  NB-1.5 Disordered eating pattern As related to balanced meals.  As evidenced by pt reports indulging.    Intervention:  Nutrition education and counseling. Mainly listened and discussed importance of having mental health counselor. I believe this will help pt manage feelings/ thoughts which could help with managing stress and how she relates to food. Provided resource for local mental health providers, contact info, and insurance requirements. Pt states she will look into contacting options that are inexpensive and/or accept her insurance.   Teaching Method Utilized:  Visual Auditory Hands on  Handouts given during visit include:  none  Barriers to learning/adherence to lifestyle change: none identified  Demonstrated degree of understanding via:  Teach Back   Monitoring/Evaluation:  Dietary intake, exercise, and body weight in 1 month(s).

## 2020-07-01 ENCOUNTER — Other Ambulatory Visit: Payer: Self-pay

## 2020-07-01 ENCOUNTER — Ambulatory Visit (INDEPENDENT_AMBULATORY_CARE_PROVIDER_SITE_OTHER): Payer: Medicare HMO | Admitting: Podiatry

## 2020-07-01 ENCOUNTER — Encounter: Payer: Self-pay | Admitting: Podiatry

## 2020-07-01 DIAGNOSIS — E119 Type 2 diabetes mellitus without complications: Secondary | ICD-10-CM

## 2020-07-01 DIAGNOSIS — G809 Cerebral palsy, unspecified: Secondary | ICD-10-CM | POA: Diagnosis not present

## 2020-07-01 DIAGNOSIS — B351 Tinea unguium: Secondary | ICD-10-CM

## 2020-07-01 DIAGNOSIS — M79676 Pain in unspecified toe(s): Secondary | ICD-10-CM | POA: Diagnosis not present

## 2020-07-01 NOTE — Progress Notes (Signed)
This patient returns to my office for at risk foot care.  This patient requires this care by a professional since this patient will be at risk due to having diabetes.  This patient is unable to cut nails herself since the patient cannot reach her nails.These nails are painful walking and wearing shoes.  This patient presents for at risk foot care today.  General Appearance  Alert, conversant and in no acute stress.  Vascular  Dorsalis pedis and posterior tibial  pulses are palpable  bilaterally.  Capillary return is within normal limits  bilaterally. Temperature is within normal limits  bilaterally.  Neurologic  Senn-Weinstein monofilament wire test within normal limits  bilaterally. Muscle power within normal limits bilaterally.  Nails Thick disfigured discolored nails with subungual debris  from hallux to fifth toes bilaterally. No evidence of bacterial infection or drainage bilaterally.  Orthopedic  No limitations of motion  feet .  No crepitus or effusions noted.  No bony pathology or digital deformities noted.  Skin  normotropic skin with no porokeratosis noted bilaterally.  No signs of infections or ulcers noted.   Formation of fissure noted. Plantar lateral left heel.  Onychomycosis  Pain in right toes  Pain in left toes  Consent was obtained for treatment procedures.   Mechanical debridement of nails 1-5  bilaterally performed with a nail nipper.  Patient requests no dremel tool usage. Padding on plantar lateral heel left foot.   Return office visit   3 months                   Told patient to return for periodic foot care and evaluation due to potential at risk complications.   Helane Gunther DPM

## 2020-07-06 DIAGNOSIS — R69 Illness, unspecified: Secondary | ICD-10-CM | POA: Diagnosis not present

## 2020-07-23 ENCOUNTER — Encounter: Payer: Self-pay | Admitting: Registered"

## 2020-07-23 ENCOUNTER — Encounter: Payer: Medicare HMO | Attending: Family Medicine | Admitting: Registered"

## 2020-07-23 ENCOUNTER — Other Ambulatory Visit: Payer: Self-pay

## 2020-07-23 DIAGNOSIS — Z713 Dietary counseling and surveillance: Secondary | ICD-10-CM | POA: Diagnosis not present

## 2020-07-23 DIAGNOSIS — E119 Type 2 diabetes mellitus without complications: Secondary | ICD-10-CM | POA: Diagnosis not present

## 2020-07-23 NOTE — Progress Notes (Signed)
Medical Nutrition Therapy:  Appt start time: 2:10 end time: 3:05  This note is not being shared with the patient for the following reason: To prevent harm (release of this note would result in harm to the life or physical safety of the patient or another).  Assessment: Primary concerns today:   States she has started back working 2 days/week.   States she is still checking BS daily: FBS (062-376). Compared to one year ago, BS numbers have increased. 2020 BS numbers were FBS (137- 189). Pt's food intake has increased over the year with meals and snacks. Pt states she is doing the best she can. States she lacks willingness to cook and buys packaged food. Also states another barrier is laziness for her. Pt states she is tired most of the time. States she feels safer baking or microwaving items rather than cooking stovetop.   Previous appts: States she may need to have 2 root canals. States she does not recall A1c from PCP appt. On days when she eats evening snack of multiple items, FBS is elevated. On days when evening snack is nonexistent, FBS are closer to 130. 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 Continues to be active during the week by walking. States she can sense satisfaction when eating.   Pt states she likes butternut squash, cauliflower (faux potatoes), broccoli with cheese or ranch, green beans, peas, beets as vegetable options. Pt loves veggie tots.     Preferred Learning Style:   No preference indicated   Learning Readiness:   Ready  Change in progress   MEDICATIONS: See list   DIETARY INTAKE:  Usual eating pattern includes 3 meals and 2 snacks per day.  Everyday foods include yogurt, nuts, berries, veggie tots, fried chicken, green beans, steamed broccoli, snow peas. Avoided foods include some vegetables.    24-hr recall:  B  (8 AM): 2 blueberry waffles + sugar-free syrup + almonds + grapes Snk (10 AM): homemade trail mix (dry cereal + almonds + grapes) L (12-1 PM): cranberry walnut salad bowl + PBJ/banana sandwich + pretzels + grapes Snk (2 PM): cheese stick + homemade trail mix (dry cereal + almonds + grapes) D (6-7 PM): cranberry walnut salad + veggie lasagna + foetatoes + veggie tots + [place] yogurt + cereal + almonds + grapes + strawberries    Snk (PM):   Beverages: water, sparkling water, diet soda, Fair Life skim milk (in cereal), water (64 oz)  Usual physical activity: walking while at work  Estimated energy needs: 2000-2200 calories 225-248 g carbohydrates 150-165 g protein 56-61 g fat  Progress Towards Goal(s):  Some progress.   Nutritional Diagnosis:  NB-1.5 Disordered eating pattern As related to balanced meals.  As evidenced by pt reports indulging.    Intervention:  Nutrition education and counseling. Discussed with pt how to have more balanced meals with carbohydrates, proteins, and vegetables. Pts demeanor becomes uneasy when discussing food. Reports she questions herself when eating and feels mom was judging her the other day related to the amount of food she was eating. I continue to discuss the importance of having mental health counselor. I believe this will help pt manage feelings/ thoughts which could help with managing stress and how she relates to food.  Teaching Method Utilized:  Visual Auditory Hands on  Handouts given during visit include:  none  Barriers to learning/adherence to lifestyle change: none identified  Demonstrated degree of understanding via:  Teach Back   Monitoring/Evaluation:  Dietary intake, exercise, and body weight in 1 month(s).

## 2020-08-03 DIAGNOSIS — R69 Illness, unspecified: Secondary | ICD-10-CM | POA: Diagnosis not present

## 2020-08-27 ENCOUNTER — Encounter: Payer: Self-pay | Admitting: Registered"

## 2020-08-27 ENCOUNTER — Encounter: Payer: Medicare HMO | Attending: Family Medicine | Admitting: Registered"

## 2020-08-27 ENCOUNTER — Other Ambulatory Visit: Payer: Self-pay

## 2020-08-27 DIAGNOSIS — E119 Type 2 diabetes mellitus without complications: Secondary | ICD-10-CM | POA: Diagnosis not present

## 2020-08-27 DIAGNOSIS — Z713 Dietary counseling and surveillance: Secondary | ICD-10-CM | POA: Insufficient documentation

## 2020-08-27 NOTE — Patient Instructions (Signed)
-   Aim to have chicken ceasar salad on Bible study nights.

## 2020-08-27 NOTE — Progress Notes (Signed)
Medical Nutrition Therapy:  Appt start time: 2:16 end time: 3:00  This note is not being shared with the patient for the following reason: To prevent harm (release of this note would result in harm to the life or physical safety of the patient or another).  Assessment: Primary concerns today:   States she has started back working 2 days/week.   States she is still checking BS daily: FBS (229-798). Pt reports not eating evening snack which has reduced FBS on some days. Reports not eating snacks while on vacation with family because no one else was eating snacks and she didn't want to be the only one. Also reports eating less than usual while on vacation with mom and sisters.   Previous appts: States she may need to have 2 root canals. States she does not recall A1c from PCP appt. On days when she eats evening snack of multiple items, FBS is elevated. On days when evening snack is nonexistent, FBS are closer to 130. 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 Continues to be active during the week by walking. States she can sense satisfaction when eating.   Pt states she likes butternut squash, cauliflower (faux potatoes), broccoli with cheese or ranch, green beans, peas, beets as vegetable options. Pt loves veggie tots.     Preferred Learning Style:   No preference indicated   Learning Readiness:   Ready  Change in progress   MEDICATIONS: See list   DIETARY INTAKE:  Usual eating pattern includes 3 meals and 2 snacks per day.  Everyday foods include yogurt, nuts, berries, veggie tots, fried chicken, green beans, steamed broccoli, snow peas. Avoided foods include some vegetables.    24-hr recall:  B (8 AM): 2 blueberry waffles + sugar-free syrup + almonds + grapes Snk (10 AM): homemade trail mix (dry cereal + almonds + grapes) L (12-1 PM):  cranberry walnut salad bowl + PBJ/banana sandwich  Snk (2 PM): homemade trail mix (dry cereal + almonds + grapes) D (6-7 PM): veggie dog (with bun) + baked beans + [place] yogurt + cereal + almonds + grapes Snk (PM):   Beverages: water, sparkling water, diet soda, Fair Life skim milk (in cereal), water (64 oz)  Usual physical activity: walking 20 min  Estimated energy needs: 2000-2200 calories 225-248 g carbohydrates 150-165 g protein 56-61 g fat  Progress Towards Goal(s):  Some progress.   Nutritional Diagnosis:  NB-1.5 Disordered eating pattern As related to balanced meals.  As evidenced by pt reports indulging.    Intervention:  Nutrition education and counseling. Discussed with pt how to have more balanced meals with carbohydrates, proteins, and vegetable. Discussed how to have meal on nights of Bible study and reaching satiety prior to attending. I continue to discuss the importance of having mental health counselor. I believe this will help pt manage feelings/ thoughts which could help with managing stress and how she relates to food. Goals: - Aim to have chicken ceasar salad on Bible study nights.   Teaching Method Utilized:  Visual Auditory Hands on  Handouts given during visit include:  none  Barriers to learning/adherence to lifestyle change: none identified  Demonstrated degree of understanding via:  Teach Back   Monitoring/Evaluation:  Dietary intake, exercise, and body weight in 1 month(s).

## 2020-09-18 DIAGNOSIS — R69 Illness, unspecified: Secondary | ICD-10-CM | POA: Diagnosis not present

## 2020-09-29 ENCOUNTER — Other Ambulatory Visit: Payer: Self-pay

## 2020-09-29 ENCOUNTER — Encounter: Payer: Medicare HMO | Attending: Family Medicine | Admitting: Registered"

## 2020-09-29 ENCOUNTER — Encounter: Payer: Self-pay | Admitting: Registered"

## 2020-09-29 DIAGNOSIS — E119 Type 2 diabetes mellitus without complications: Secondary | ICD-10-CM | POA: Diagnosis not present

## 2020-09-29 NOTE — Progress Notes (Signed)
Medical Nutrition Therapy:  Appt start time: 10:02 end time: 10:58  Assessment: Primary concerns today:   States she does not like to stay home: attends Bible study 2x/week, work 2x/week, and volunteers 2x/week. States she has been walking as physical activity. Does not want to go to gym due to transportation challenges with [employer]. States it is much easier to walk in neighborhood instead of waiting for transportation. States she has access to local gyms via health insurance.   States she is still checking BS daily: FBS ([id number]); about the same as previous visit. Pt continues to not eat evening snack which has reduced FBS on some days.  Previous appts: States she does not recall A1c from PCP appt. On days when she eats evening snack of multiple items, FBS is elevated. On days when evening snack is nonexistent, FBS are closer to 130. 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 Continues to be active during the week by walking. States she can sense satisfaction when eating.   Pt states she likes butternut squash, cauliflower (faux potatoes), broccoli with cheese or ranch, green beans, peas, beets as vegetable options. Pt loves veggie tots.     Preferred Learning Style:   No preference indicated   Learning Readiness:   Ready  Change in progress   MEDICATIONS: See list   DIETARY INTAKE:  Usual eating pattern includes 3 meals and 2 snacks per day.  Everyday foods include yogurt, nuts, berries, veggie tots, fried chicken, green beans, steamed broccoli, snow peas. Avoided foods include some vegetables.    24-hr recall:  B (8 AM): cereal + almonds + grapes + milk Snk (10 AM): homemade trail mix (dry cereal + almonds + grapes) L (12-1 PM): bbq sandwich + potato salad + slaw + cookies  Snk (2 PM): pretzels + grapes D (6-7 PM): sometimes skips; veggie  patty + [employer] mac and cheese + veggie tots + mango pieces + cookies Snk (PM):   Beverages: water (64 oz), coffee, sparkling water, diet soda (occasionally), Fair Life skim milk (in cereal)  Usual physical activity: walking 20 min, 7 days/week; squatting and bending while at work   Estimated energy needs: 2000-2200 calories 225-248 g carbohydrates 150-165 g protein 56-61 g fat  Progress Towards Goal(s):  Some progress.   Nutritional Diagnosis:  NB-1.5 Disordered eating pattern As related to balanced meals.  As evidenced by pt reports indulging.    Intervention:  Nutrition education and counseling. I continue to discuss the importance of having mental health counselor. I believe this will help pt manage feelings/ thoughts which could help with managing stress and how she relates to food. Discussed ways to increase physical activity and how to add variety by utilizing local gyms. Pt was in agreement with goal listed.  Goals: - Aim for 40 min walking on non-workdays and 20 min walking on workdays.   Teaching Method Utilized:  Visual Auditory Hands on  Handouts given during visit include:  none  Barriers to learning/adherence to lifestyle change: none identified  Demonstrated degree of understanding via:  Teach Back   Monitoring/Evaluation:  Dietary intake, exercise, and body weight in 1 month(s).

## 2020-09-29 NOTE — Patient Instructions (Signed)
-   Aim for 40 min walking on non-workdays and 20 min walking on workdays.

## 2020-09-30 ENCOUNTER — Ambulatory Visit (INDEPENDENT_AMBULATORY_CARE_PROVIDER_SITE_OTHER): Payer: Medicare HMO | Admitting: Podiatry

## 2020-09-30 ENCOUNTER — Encounter: Payer: Self-pay | Admitting: Podiatry

## 2020-09-30 ENCOUNTER — Other Ambulatory Visit: Payer: Self-pay

## 2020-09-30 DIAGNOSIS — M79609 Pain in unspecified limb: Secondary | ICD-10-CM | POA: Diagnosis not present

## 2020-09-30 DIAGNOSIS — B351 Tinea unguium: Secondary | ICD-10-CM

## 2020-09-30 DIAGNOSIS — E119 Type 2 diabetes mellitus without complications: Secondary | ICD-10-CM | POA: Diagnosis not present

## 2020-09-30 DIAGNOSIS — G809 Cerebral palsy, unspecified: Secondary | ICD-10-CM

## 2020-09-30 NOTE — Progress Notes (Signed)
This patient returns to my office for at risk foot care.  This patient requires this care by a professional since this patient will be at risk due to having diabetes.  This patient is unable to cut nails herself since the patient cannot reach her nails.These nails are painful walking and wearing shoes.  This patient presents for at risk foot care today.  General Appearance  Alert, conversant and in no acute stress.  Vascular  Dorsalis pedis and posterior tibial  pulses are palpable  bilaterally.  Capillary return is within normal limits  bilaterally. Temperature is within normal limits  bilaterally.  Neurologic  Senn-Weinstein monofilament wire test within normal limits  bilaterally. Muscle power within normal limits bilaterally.  Nails Thick disfigured discolored nails with subungual debris  from hallux to fifth toes bilaterally. No evidence of bacterial infection or drainage bilaterally.  Orthopedic  No limitations of motion  feet .  No crepitus or effusions noted.  No bony pathology or digital deformities noted.  Skin  normotropic skin with no porokeratosis noted bilaterally.  No signs of infections or ulcers noted.   Formation of fissure noted. Plantar lateral left heel.  Onychomycosis  Pain in right toes  Pain in left toes  Consent was obtained for treatment procedures.   Mechanical debridement of nails 1-5  bilaterally performed with a nail nipper.  Patient requests no dremel tool usage. Reddish discoloration of top of both feet.  Told patient to watch the redness.   Return office visit   3 months                   Told patient to return for periodic foot care and evaluation due to potential at risk complications.   Helane Gunther DPM

## 2020-11-02 DIAGNOSIS — Z794 Long term (current) use of insulin: Secondary | ICD-10-CM | POA: Diagnosis not present

## 2020-11-02 DIAGNOSIS — H5213 Myopia, bilateral: Secondary | ICD-10-CM | POA: Diagnosis not present

## 2020-11-02 DIAGNOSIS — H52203 Unspecified astigmatism, bilateral: Secondary | ICD-10-CM | POA: Diagnosis not present

## 2020-11-02 DIAGNOSIS — E119 Type 2 diabetes mellitus without complications: Secondary | ICD-10-CM | POA: Diagnosis not present

## 2020-11-02 DIAGNOSIS — Z7984 Long term (current) use of oral hypoglycemic drugs: Secondary | ICD-10-CM | POA: Diagnosis not present

## 2020-11-03 ENCOUNTER — Other Ambulatory Visit: Payer: Self-pay

## 2020-11-03 ENCOUNTER — Encounter: Payer: Medicare HMO | Attending: Family Medicine | Admitting: Registered"

## 2020-11-03 ENCOUNTER — Encounter: Payer: Self-pay | Admitting: Registered"

## 2020-11-03 DIAGNOSIS — E119 Type 2 diabetes mellitus without complications: Secondary | ICD-10-CM | POA: Diagnosis not present

## 2020-11-03 NOTE — Progress Notes (Signed)
Medical Nutrition Therapy:  Appt start time: 10:02 end time: 11:00  Assessment: Primary concerns today:   States she had a pleasant Thanksgiving with family. States they ate around 6 pm. States she had breakfast and lunch that day. States she didn't focus on what others were doing on and di her own thing related to eating during the day. States she had 1 plate of food and did not return for a second helping. States she has not been active as much due to weather being colder and not wanting to walk in the cold. Still thinking about going to local gym.  Does not want to go to gym due to transportation challenges with [employer]. States it is much easier to walk in neighborhood instead of waiting for transportation. States she has access to local gyms via health insurance.   States she is still checking BS daily: FBS (250-370); some lower numbers from previous visit. Pt continues to not eat evening snack which has reduced FBS on some days.  Previous appts: States she does not recall A1c from PCP appt. On days when she eats evening snack of multiple items, FBS is elevated. On days when evening snack is nonexistent, FBS are closer to 130. 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 Continues to be active during the week by walking. States she can sense satisfaction when eating.   Pt states she likes butternut squash, cauliflower (faux potatoes), broccoli with cheese or ranch, green beans, peas, beets as vegetable options. Pt loves veggie tots.     Preferred Learning Style:   No preference indicated   Learning Readiness:   Ready  Change in progress   MEDICATIONS: See list   DIETARY INTAKE:  Usual eating pattern includes 3 meals and 2 snacks per day.  Everyday foods include yogurt, nuts, berries, veggie tots, fried chicken, green beans, steamed broccoli, snow  peas. Avoided foods include some vegetables.    24-hr recall:  B (8 AM): cereal + almonds + grapes + milk Snk (10 AM): homemade trail mix (dry cereal + almonds + grapes) L (12-1 PM): bbq sandwich + potato salad + slaw + cookies  Snk (2 PM): combos + grapes D (6-7 PM): veggie patty + mac and cheese + veggie tots + baked apple slices Snk (PM):   Beverages: water (64 oz), coffee, sparkling water, diet soda (occasionally), Fair Life skim milk (in cereal)  Usual physical activity: walking 20 min, 7 days/week; squatting and bending while at work   Estimated energy needs: 2000-2200 calories 225-248 g carbohydrates 150-165 g protein 56-61 g fat  Progress Towards Goal(s):  Some progress.   Nutritional Diagnosis:  NB-1.5 Disordered eating pattern As related to balanced meals.  As evidenced by pt reports indulging.    Intervention:  Nutrition education and counseling. I continue to discuss the importance of having mental health counselor. I believe this will help pt manage feelings/ thoughts which could help with managing stress and how she relates to food. Discussed ways to increase physical activity and how to add variety by utilizing local gyms. Pt was in agreement with goal listed.    Teaching Method Utilized:  Visual Auditory Hands on  Handouts given during visit include:  none  Barriers to learning/adherence to lifestyle change: none identified  Demonstrated degree of understanding via:  Teach Back   Monitoring/Evaluation:  Dietary intake, exercise, and body weight in 1 month(s).

## 2020-11-23 ENCOUNTER — Other Ambulatory Visit: Payer: Self-pay | Admitting: Podiatry

## 2020-11-23 NOTE — Telephone Encounter (Signed)
Please advise 

## 2020-11-24 ENCOUNTER — Other Ambulatory Visit: Payer: Self-pay | Admitting: *Deleted

## 2020-11-25 DIAGNOSIS — R69 Illness, unspecified: Secondary | ICD-10-CM | POA: Diagnosis not present

## 2020-12-02 ENCOUNTER — Ambulatory Visit: Payer: Medicare HMO | Admitting: Registered"

## 2020-12-08 ENCOUNTER — Encounter: Payer: Self-pay | Admitting: Registered"

## 2020-12-08 ENCOUNTER — Other Ambulatory Visit: Payer: Self-pay

## 2020-12-08 ENCOUNTER — Encounter: Payer: Medicare HMO | Attending: Family Medicine | Admitting: Registered"

## 2020-12-08 DIAGNOSIS — E1121 Type 2 diabetes mellitus with diabetic nephropathy: Secondary | ICD-10-CM | POA: Diagnosis not present

## 2020-12-08 DIAGNOSIS — E119 Type 2 diabetes mellitus without complications: Secondary | ICD-10-CM

## 2020-12-08 NOTE — Patient Instructions (Addendum)
-   Set boundary around watching tv until 9 pm and work nights and 11 pm on non-work nights.   - Aim to have half of honey bun or 1 pop tart after dinner.

## 2020-12-08 NOTE — Progress Notes (Signed)
Medical Nutrition Therapy:  Appt start time: 10:00 end time: 10:52  Assessment: Primary concerns today:   States she does not have food journal today due to technical difficulties with phone; keeps food journal on phone. Reports she had good holiday time with family. States she didn't eat great but enjoyed her holidays. States she walked a little during the holidays while the weather was warm. Reports she purchased honey buns and pop tarts within last few weeks. States she will eat more than one honey bun while watching tv at night. States she will continue to eat them to help her stay awake to continue watching her shows on Netflix until she can't stay awake any longer.   States she has started drinking 64 oz of water daily. Take container with her. States this may be easier for her and trying it out.  States she is still checking BS daily: FBS (251-444); some higher numbers from previous visit.   Previous appts: States she does not recall A1c from PCP appt. On days when she eats evening snack of multiple items, FBS is elevated. On days when evening snack is nonexistent, FBS are closer to 130. 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 Continues to be active during the week by walking. States she can sense satisfaction when eating.   Pt states she likes butternut squash, cauliflower (faux potatoes), broccoli with cheese or ranch, green beans, peas, beets as vegetable options. Pt loves veggie tots.     Preferred Learning Style:   No preference indicated   Learning Readiness:   Ready  Change in progress   MEDICATIONS: See list   DIETARY INTAKE:  Usual eating pattern includes 3 meals and 2 snacks per day.  Everyday foods include yogurt, nuts, berries, veggie tots, fried chicken, green beans, steamed broccoli, snow peas. Avoided foods include some  vegetables.    24-hr recall:  B (8 AM): cereal + almonds + grapes + milk Snk (10 AM): homemade trail mix (dry cereal + almonds + grapes) L (12-1 PM): bbq sandwich + potato salad + slaw + cookies  Snk (2 PM): combos + grapes D (6-7 PM): veggie patty + mac and cheese + veggie tots + baked apple slices Snk (PM):   Beverages: water (64 oz), coffee, sparkling water, diet soda (occasionally), Fair Life skim milk (in cereal)  Usual physical activity: walking 20 min, 7 days/week; squatting and bending while at work   Estimated energy needs: 2000-2200 calories 225-248 g carbohydrates 150-165 g protein 56-61 g fat  Progress Towards Goal(s):  Some progress.   Nutritional Diagnosis:  NB-1.5 Disordered eating pattern As related to balanced meals.  As evidenced by pt reports indulging.    Intervention:  Nutrition education and counseling. Discussed eating dessert after dinner and eating while watching tv. Discussed how to mindfully enjoy food. Discussed positive self-talk related to decision-making and food choices. Pt was in agreement with goal listed.  Goals: - Set boundary around watching tv until 9 pm and work nights and 11 pm on non-work nights.  - Aim to have half of honey bun or 1 pop tart after dinner.   Teaching Method Utilized:  Visual Auditory Hands on  Handouts given during visit include:  none  Barriers to learning/adherence to lifestyle change: none identified  Demonstrated degree of understanding via:  Teach Back  Monitoring/Evaluation:  Dietary intake, exercise, and body weight in 1 month(s).

## 2020-12-30 ENCOUNTER — Other Ambulatory Visit: Payer: Self-pay

## 2020-12-30 ENCOUNTER — Encounter: Payer: Self-pay | Admitting: Podiatry

## 2020-12-30 ENCOUNTER — Ambulatory Visit (INDEPENDENT_AMBULATORY_CARE_PROVIDER_SITE_OTHER): Payer: Medicare HMO | Admitting: Podiatry

## 2020-12-30 DIAGNOSIS — B351 Tinea unguium: Secondary | ICD-10-CM

## 2020-12-30 DIAGNOSIS — M79609 Pain in unspecified limb: Secondary | ICD-10-CM

## 2020-12-30 DIAGNOSIS — G809 Cerebral palsy, unspecified: Secondary | ICD-10-CM

## 2020-12-30 DIAGNOSIS — E119 Type 2 diabetes mellitus without complications: Secondary | ICD-10-CM

## 2020-12-30 NOTE — Progress Notes (Signed)
This patient returns to my office for at risk foot care.  This patient requires this care by a professional since this patient will be at risk due to having diabetes.  This patient is unable to cut nails herself since the patient cannot reach her nails.These nails are painful walking and wearing shoes.  This patient presents for at risk foot care today.  General Appearance  Alert, conversant and in no acute stress.  Vascular  Dorsalis pedis and posterior tibial  pulses are palpable  bilaterally.  Capillary return is within normal limits  bilaterally. Temperature is within normal limits  Bilaterally. Swelling feet/legs  B/L.  Neurologic  Senn-Weinstein monofilament wire test within normal limits  bilaterally. Muscle power within normal limits bilaterally.  Nails Thick disfigured discolored nails with subungual debris  from hallux to fifth toes bilaterally. No evidence of bacterial infection or drainage bilaterally.  Orthopedic  No limitations of motion  feet .  No crepitus or effusions noted.  No bony pathology or digital deformities noted.  Skin  normotropic skin with no porokeratosis noted bilaterally.  No signs of infections or ulcers noted.   Formation of fissure noted. Plantar lateral left heel.  Onychomycosis  Pain in right toes  Pain in left toes  Consent was obtained for treatment procedures.   Mechanical debridement of nails 1-5  bilaterally performed with a nail nipper.  Patient requests no dremel tool usage. Reddish discoloration of top of both feet.  Told patient to watch the redness.   Return office visit   3 months                   Told patient to return for periodic foot care and evaluation due to potential at risk complications.   Helane Gunther DPM

## 2021-01-12 ENCOUNTER — Encounter: Payer: Medicare HMO | Attending: Family Medicine | Admitting: Registered"

## 2021-01-12 ENCOUNTER — Encounter: Payer: Self-pay | Admitting: Registered"

## 2021-01-12 ENCOUNTER — Other Ambulatory Visit: Payer: Self-pay

## 2021-01-12 DIAGNOSIS — E119 Type 2 diabetes mellitus without complications: Secondary | ICD-10-CM | POA: Insufficient documentation

## 2021-01-12 NOTE — Patient Instructions (Addendum)
-   Check into [employer] membership near your home for physical activity during colder months. [phone].  - Set boundary around watching tv until 9 pm on work nights and 11 pm on non-work nights.   - Check out Type 2 diabetes support group. Next meeting is Mon, 2/14 at 6 pm.

## 2021-01-12 NOTE — Progress Notes (Signed)
Medical Nutrition Therapy:  Appt start time: 10:04 end time: 10:55  Assessment: Primary concerns today:   Pt arrives stating eating healthy is expensive. Reports she recently spent more than usual when grocery shopping yesterday. Reports checking BS once a day: ([id number]); numbers are consistent with previous visit.  States it has been too cold to walk outside but knows she needs to be walking. States she has not intentionally worked on goals from previous visit because she tries to remember them but forgets. States she has pop tarts at home but has not eaten them. States she is surprised they have lasted this long. States she has not been doing too well not having the entire honey bun in one setting.   Continues to drink at least 64 oz of water/day. States its going well.  Previous appts: States she does not recall A1c from PCP appt. On days when she eats evening snack of multiple items, FBS is elevated. On days when evening snack is nonexistent, FBS are closer to 130. 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 Continues to be active during the week by walking. States she can sense satisfaction when eating.   Pt states she likes butternut squash, cauliflower (faux potatoes), broccoli with cheese or ranch, green beans, peas, beets as vegetable options. Pt loves veggie tots.     Preferred Learning Style:   No preference indicated   Learning Readiness:   Ready  Change in progress   MEDICATIONS: See list   DIETARY INTAKE:  Usual eating pattern includes 3 meals and 2 snacks per day.  Everyday foods include yogurt, nuts, berries, veggie tots, fried chicken, green beans, steamed broccoli, snow peas. Avoided foods include some vegetables.    24-hr recall:  B (8 AM): cereal + almonds + grapes + milk Snk (10 AM): homemade trail mix (dry cereal +  almonds + grapes) L (12-1 PM): bbq sandwich + potato salad + slaw + cookies  Snk (2 PM): combos + grapes D (6-7 PM): veggie patty + mac and cheese + veggie tots + baked apple slices Snk (PM):   Beverages: water (64 oz), coffee, sparkling water, diet soda (occasionally), Fair Life skim milk (in cereal)  Usual physical activity: walking 20 min, 7 days/week; squatting and bending while at work   Estimated energy needs: 2000-2200 calories 225-248 g carbohydrates 150-165 g protein 56-61 g fat  Progress Towards Goal(s):  Some progress.   Nutritional Diagnosis:  NB-1.5 Disordered eating pattern As related to balanced meals.  As evidenced by pt reports indulging.    Intervention:  Nutrition education and counseling. Discussed goals from previous visit and barriers to intentionally work on them. Pt states she will post goals on refrigerator to help remember them. Discusses barriers with being active related to inclement weather and finances paying for [employer]. Discussed temporary alternative of attending nearby gym during colder months. Discussed positive self-talk related to decision-making and food choices. Pt was in agreement with goal listed.  Goals: - Check into [employer] membership near your home for physical activity during colder months. [phone]. - Set boundary around watching tv until 9 pm on work nights and 11 pm on non-work nights.  - Check out Type 2 diabetes support group. Next meeting is Mon, 2/14 at 6 pm.   Teaching Method Utilized:  Visual Auditory Hands on  Handouts  given during visit include:  none  Barriers to learning/adherence to lifestyle change: none identified  Demonstrated degree of understanding via:  Teach Back   Monitoring/Evaluation:  Dietary intake, exercise, and body weight in 1 month(s).

## 2021-01-18 ENCOUNTER — Other Ambulatory Visit: Payer: Self-pay | Admitting: Podiatry

## 2021-01-18 NOTE — Telephone Encounter (Signed)
Please advise 

## 2021-02-02 ENCOUNTER — Encounter: Payer: Medicare HMO | Attending: Family Medicine | Admitting: Registered"

## 2021-02-02 ENCOUNTER — Encounter: Payer: Self-pay | Admitting: Registered"

## 2021-02-02 ENCOUNTER — Other Ambulatory Visit: Payer: Self-pay

## 2021-02-02 DIAGNOSIS — E119 Type 2 diabetes mellitus without complications: Secondary | ICD-10-CM | POA: Insufficient documentation

## 2021-02-02 NOTE — Patient Instructions (Addendum)
-   Set boundary around watching tv until 9 pm on work nights and 11 pm on non-work nights.   - Check out Type 2 diabetes support group. Next meeting is Mon, 3/14 at 6 pm.   - Aim to walk at a pace that gets your heart rate pumping.

## 2021-02-02 NOTE — Progress Notes (Signed)
Medical Nutrition Therapy:  Appt start time: 10:03 end time: 10:50  Assessment: Primary concerns today: Reports checking BS once a day: ([phone]); numbers are consistent with previous visit. States she is eating the same things daily. Reports she has not gone to the gym since weather is warming up and planning to be more active outside. States she went walking yesterday. States she walks and talks on the phone with a friend.  States she continues to drink at least 64 oz of water/day. States its going well. Reports eating marshmallows recently and sweet treat and apologizes for it.    Previous appts: States she does not recall A1c from PCP appt. On days when she eats evening snack of multiple items, FBS is elevated. On days when evening snack is nonexistent, FBS are closer to 130. 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 Continues to be active during the week by walking. States she can sense satisfaction when eating.   Pt states she likes butternut squash, cauliflower (faux potatoes), broccoli with cheese or ranch, green beans, peas, beets as vegetable options. Pt loves veggie tots.     Preferred Learning Style:   No preference indicated   Learning Readiness:   Ready  Change in progress   MEDICATIONS: See list   DIETARY INTAKE:  Usual eating pattern includes 3 meals and 2 snacks per day.  Everyday foods include yogurt, nuts, berries, veggie tots, fried chicken, green beans, steamed broccoli, snow peas. Avoided foods include some vegetables.    24-hr recall:  B (8 AM): cereal + almonds + grapes + milk or 2 blueberry waffles + almonds + grapes + sugar-free syrup Snk (10 AM): homemade trail mix (dry cereal + almonds + grapes) L (12-1 PM): restaurant - 1/2 burger + salad  Snk (2 PM): combos + almonds + grapes D (6-7 PM): veggie patty + citrus  salad + veggie tots + mac and cheese Snk (PM): a few marshmellows  Beverages: water (64 oz), coffee, sparkling water, diet soda (occasionally), Fair Life skim milk (in cereal)  Usual physical activity: walking 20 min, 7 days/week; squatting and bending while at work   Estimated energy needs: 2000-2200 calories 225-248 g carbohydrates 150-165 g protein 56-61 g fat  Progress Towards Goal(s):  Some progress.   Nutritional Diagnosis:  NB-1.5 Disordered eating pattern As related to balanced meals.  As evidenced by pt reports indulging.    Intervention:  Nutrition education and counseling. Discussed goals from previous visit and barriers to intentionally work on them. Discussed Hello Fresh as meal option 1-2 times a week to help with providing variety and balance. Discussed goal of movement being to increase heart rate vs leisurely walking. Pt was in agreement with goal listed.  Goals: - Set boundary around watching tv until 9 pm on work nights and 11 pm on non-work nights.  - Check out Type 2 diabetes support group. Next meeting is Mon, 3/14 at 6 pm.  - Aim to walk at a pace that gets your heart rate pumping.   Teaching Method Utilized:  Visual Auditory Hands on  Handouts given during visit include:  none  Barriers to learning/adherence to lifestyle change: none identified  Demonstrated degree of understanding via:  Teach Back   Monitoring/Evaluation:  Dietary intake, exercise, and body weight in 1 month(s).

## 2021-02-22 DIAGNOSIS — Z0000 Encounter for general adult medical examination without abnormal findings: Secondary | ICD-10-CM | POA: Diagnosis not present

## 2021-02-22 DIAGNOSIS — G809 Cerebral palsy, unspecified: Secondary | ICD-10-CM | POA: Diagnosis not present

## 2021-02-22 DIAGNOSIS — Z1389 Encounter for screening for other disorder: Secondary | ICD-10-CM | POA: Diagnosis not present

## 2021-02-22 DIAGNOSIS — E7800 Pure hypercholesterolemia, unspecified: Secondary | ICD-10-CM | POA: Diagnosis not present

## 2021-02-22 DIAGNOSIS — J309 Allergic rhinitis, unspecified: Secondary | ICD-10-CM | POA: Diagnosis not present

## 2021-02-22 DIAGNOSIS — Z124 Encounter for screening for malignant neoplasm of cervix: Secondary | ICD-10-CM | POA: Diagnosis not present

## 2021-02-22 DIAGNOSIS — K219 Gastro-esophageal reflux disease without esophagitis: Secondary | ICD-10-CM | POA: Diagnosis not present

## 2021-02-22 DIAGNOSIS — N181 Chronic kidney disease, stage 1: Secondary | ICD-10-CM | POA: Diagnosis not present

## 2021-02-22 DIAGNOSIS — G8191 Hemiplegia, unspecified affecting right dominant side: Secondary | ICD-10-CM | POA: Diagnosis not present

## 2021-02-22 DIAGNOSIS — E1121 Type 2 diabetes mellitus with diabetic nephropathy: Secondary | ICD-10-CM | POA: Diagnosis not present

## 2021-03-09 ENCOUNTER — Other Ambulatory Visit: Payer: Self-pay

## 2021-03-09 ENCOUNTER — Encounter: Payer: Medicare HMO | Attending: Family Medicine | Admitting: Registered"

## 2021-03-09 ENCOUNTER — Encounter: Payer: Self-pay | Admitting: Registered"

## 2021-03-09 DIAGNOSIS — E119 Type 2 diabetes mellitus without complications: Secondary | ICD-10-CM

## 2021-03-09 NOTE — Progress Notes (Signed)
Medical Nutrition Therapy:  Appt start time: 9:50 end time: 10:55  Assessment: Primary concerns today: Reports checking BS once a day: ([id number]); numbers are consistent with previous visit. States she is eating the same things daily. States she continues to drink at least 64 oz of water/day. States its going well.   States she has been walking outside most days since previous visit. States she had her physical with Dr. Laurann Montana last month. States it wasn't good. States its the same information from previous appointments. States she had a few episodes of overeating and ended up vomiting during the night.   Previous appts: States she does not recall A1c from PCP appt. On days when she eats evening snack of multiple items, FBS is elevated. On days when evening snack is nonexistent, FBS are closer to 130. 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 States she can sense satisfaction when eating.   Pt states she likes butternut squash, cauliflower (faux potatoes), broccoli with cheese or ranch, green beans, peas, beets as vegetable options. Pt loves veggie tots.     Preferred Learning Style:   No preference indicated   Learning Readiness:   Ready  Change in progress   MEDICATIONS: See list   DIETARY INTAKE:  Usual eating pattern includes 3 meals and 2 snacks per day.  Everyday foods include yogurt, nuts, berries, veggie tots, fried chicken, green beans, steamed broccoli, snow peas. Avoided foods include some vegetables.    24-hr recall:  B (8 AM): cereal + almonds + grapes + milk or 2 blueberry waffles + almonds + grapes + sugar-free syrup Snk (10 AM): homemade trail mix (dry cereal + almonds + grapes) L (12-1 PM): restaurant - 1/2 burger + salad  Snk (2 PM): combos + almonds + grapes D (6-7 PM): veggie patty + citrus salad + veggie tots + mac and  cheese Snk (PM): a few marshmellows  Beverages: water (64 oz), coffee, sparkling water, diet soda (occasionally), Fair Life skim milk (in cereal)  Usual physical activity: walking 20 min, 7 days/week; squatting and bending while at work   Estimated energy needs: 2000-2200 calories 225-248 g carbohydrates 150-165 g protein 56-61 g fat  Progress Towards Goal(s):  Some progress.   Nutritional Diagnosis:  NB-1.5 Disordered eating pattern As related to balanced meals.  As evidenced by pt reports indulging.    Intervention:  Nutrition education and counseling. Discussed goals from previous visit and barriers to intentionally work on them. Discussed hunger and satiety, benefits of being consistently active, and obtaining lab values. Pt agreed with goals listed.  Goals: - Aim to have 3 meals and 1 snack a day.  - Have snack as morning snack.  - Obtain access to log into patient portal for recent lab values.  Teaching Method Utilized:  Visual Auditory Hands on  Handouts given during visit include:  none  Barriers to learning/adherence to lifestyle change: none identified  Demonstrated degree of understanding via:  Teach Back   Monitoring/Evaluation:  Dietary intake, exercise, and body weight in 1 month(s).

## 2021-03-18 ENCOUNTER — Telehealth: Payer: Self-pay

## 2021-03-18 NOTE — Telephone Encounter (Signed)
Called to discuss with patient about COVID-19 symptoms and the use of one of the available treatments for those with mild to moderate Covid symptoms and at a high risk of hospitalization.  Pt appears to qualify for outpatient treatment due to co-morbid conditions and/or a member of an at-risk group in accordance with the FDA Emergency Use Authorization.    Symptom onset:  Vaccinated:  Booster?  Immunocompromised?  Qualifiers:   Unable to reach pt - Left message and call back number [phone].  Esther Hardy

## 2021-03-31 ENCOUNTER — Ambulatory Visit: Payer: Medicare HMO | Admitting: Podiatry

## 2021-04-13 ENCOUNTER — Ambulatory Visit: Payer: Medicare HMO | Admitting: Registered"

## 2021-05-12 ENCOUNTER — Ambulatory Visit (INDEPENDENT_AMBULATORY_CARE_PROVIDER_SITE_OTHER): Payer: Medicare HMO | Admitting: Podiatry

## 2021-05-12 ENCOUNTER — Encounter: Payer: Self-pay | Admitting: Podiatry

## 2021-05-12 ENCOUNTER — Other Ambulatory Visit: Payer: Self-pay

## 2021-05-12 DIAGNOSIS — M79676 Pain in unspecified toe(s): Secondary | ICD-10-CM | POA: Diagnosis not present

## 2021-05-12 DIAGNOSIS — G809 Cerebral palsy, unspecified: Secondary | ICD-10-CM

## 2021-05-12 DIAGNOSIS — E119 Type 2 diabetes mellitus without complications: Secondary | ICD-10-CM | POA: Diagnosis not present

## 2021-05-12 DIAGNOSIS — B351 Tinea unguium: Secondary | ICD-10-CM | POA: Diagnosis not present

## 2021-05-12 NOTE — Progress Notes (Signed)
This patient returns to my office for at risk foot care.  This patient requires this care by a professional since this patient will be at risk due to having diabetes.  This patient is unable to cut nails herself since the patient cannot reach her nails.These nails are painful walking and wearing shoes.  This patient presents for at risk foot care today.    General Appearance  Alert, conversant and in no acute stress.  Vascular  Dorsalis pedis and posterior tibial  pulses are weakly  palpable  due to swelling  bilaterally.  Capillary return is within normal limits  bilaterally. Temperature is within normal limits  Bilaterally. Swelling feet/legs  B/L.  Neurologic  Senn-Weinstein monofilament wire test within normal limits  bilaterally. Muscle power within normal limits bilaterally.  Nails Thick disfigured discolored nails with subungual debris  from hallux to fifth toes bilaterally. No evidence of bacterial infection or drainage bilaterally.  Orthopedic  No limitations of motion  feet .  No crepitus or effusions noted.  No bony pathology or digital deformities noted.  Skin  normotropic skin with no porokeratosis noted bilaterally.  No signs of infections or ulcers noted.     Onychomycosis  Pain in right toes  Pain in left toes  Consent was obtained for treatment procedures.   Mechanical debridement of nails 1-5  bilaterally performed with a nail nipper.  Patient requests no dremel tool usage. Reddish discoloration of top of both feet.  Told patient to watch the redness.   Return office visit   3 months                   Told patient to return for periodic foot care and evaluation due to potential at risk complications.   Helane Gunther DPM

## 2021-05-25 ENCOUNTER — Encounter: Payer: Medicare HMO | Attending: Family Medicine | Admitting: Registered"

## 2021-05-25 ENCOUNTER — Other Ambulatory Visit: Payer: Self-pay

## 2021-05-25 ENCOUNTER — Encounter: Payer: Self-pay | Admitting: Registered"

## 2021-05-25 DIAGNOSIS — E119 Type 2 diabetes mellitus without complications: Secondary | ICD-10-CM | POA: Diagnosis not present

## 2021-05-25 NOTE — Progress Notes (Signed)
Medical Nutrition Therapy:  Appt start time: 9:55 end time: 10:50  Assessment: Primary concerns today: States she had COVID since previous visit. States during that time while being home, she was walking more consistently averaging 30-60 min, 2-4 times/week. Recovered well from [place]. States she has tried to continue walking. Reports she walks at an average pace to prevent tripping and falling. States she has fallen before and wants to prevent it from happening again.   Reports checking BS once a day: FBS (315-435) an increase from previous visit. Reports  medication changes: no longer taking Levemir or Victoza in the morning. States she is currently taking another diabetes medication at night along with other medications, but does not recall the name. States she will call back with this information. Reports she started off with 2 units of new insulin but now taking 4 units, as of 05/05/2021. Reports next appt with PCP is 9/26.   States she continues to drink at least 64 oz of water/day.   Previous appts: States she does not recall A1c from PCP appt. On days when she eats evening snack of multiple items, FBS is elevated. On days when evening snack is nonexistent, FBS are closer to 130. 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 States she can sense satisfaction when eating.   Pt states she likes butternut squash, cauliflower (faux potatoes), broccoli with cheese or ranch, green beans, peas, beets as vegetable options. Pt loves veggie tots.     Preferred Learning Style:  No preference indicated   Learning Readiness:  Ready Change in progress   MEDICATIONS: See list   DIETARY INTAKE:  Usual eating pattern includes 3 meals and 1 snack per day.  Everyday foods include yogurt, nuts, berries, veggie tots, fried chicken, green beans, steamed broccoli, snow  peas. Avoided foods include some vegetables.    24-hr recall:  B (8 AM): 2 blueberry waffles + cereal/almonds + grapes + sugar-free syrup Snk (10 AM): 2 pop tarts or homemade trail mix (dry cereal + almonds + grapes) L (12-1 PM): [employer] - chicken salad + pasta  Snk (2 PM):  D (6-7 PM): veggie patty + mac and cheese + fotatoes + almonds/trail mix  Snk (PM):   Beverages: water (64 oz), coffee, sparkling water, diet soda (occasionally), Fair Life skim milk (in cereal)  Usual physical activity: walking 20 min, 7 days/week; squatting and bending while at work   Estimated energy needs: 2000-2200 calories 225-248 g carbohydrates 150-165 g protein 56-61 g fat  Progress Towards Goal(s):  Some progress.   Nutritional Diagnosis:  NB-1.5 Disordered eating pattern As related to balanced meals.  As evidenced by pt reports indulging.    Intervention:  Nutrition education and counseling. Discussed goals from previous visit and ways to create habits. Discussed alternative options for movement other than walking. Pt agreed with goals listed.  Goals: - Aim to have 3 meals and 1 snack a day.  - Have snack as morning snack.  - Obtain access to log into patient portal for recent lab values.  Teaching Method Utilized:  Visual Auditory Hands on  Handouts given during visit include: none  Barriers to learning/adherence to lifestyle change: none identified  Demonstrated degree of understanding via:  Teach Back   Monitoring/Evaluation:  Dietary intake, exercise, and body weight in 1 month(s).

## 2021-05-25 NOTE — Patient Instructions (Signed)
-   Aim to have 3 meals and 1 snack a day.   - Have snack as morning snack.   - Obtain access to log into patient portal for recent lab values.

## 2021-05-28 DIAGNOSIS — J069 Acute upper respiratory infection, unspecified: Secondary | ICD-10-CM | POA: Diagnosis not present

## 2021-05-28 DIAGNOSIS — J029 Acute pharyngitis, unspecified: Secondary | ICD-10-CM | POA: Diagnosis not present

## 2021-06-22 ENCOUNTER — Other Ambulatory Visit: Payer: Self-pay

## 2021-06-22 ENCOUNTER — Encounter: Payer: Self-pay | Admitting: Registered"

## 2021-06-22 ENCOUNTER — Encounter: Payer: Medicare HMO | Attending: Family Medicine | Admitting: Registered"

## 2021-06-22 DIAGNOSIS — E119 Type 2 diabetes mellitus without complications: Secondary | ICD-10-CM | POA: Diagnosis not present

## 2021-06-22 NOTE — Progress Notes (Signed)
Medical Nutrition Therapy:  Appt start time: 2:05 end time: 2:55  Assessment: Primary concerns today: States she celebrated birthday with sister in the [place]. States she is daily checking FBS (325-434). Reports she was giving herself 4 units of insulin in June and will give an additional 2 units of insulin, totally 6 units a day. States she has been intentional about walking during the week, 3 days/week.   States she continues to drink at least 64 oz of water/day.   Previous appts: Reports next appt with PCP is 9/26. States she does not recall A1c from PCP appt. On days when she eats evening snack of multiple items, FBS is elevated. On days when evening snack is nonexistent, FBS are closer to 130. 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 States she can sense satisfaction when eating.   Pt states she likes butternut squash, cauliflower (faux potatoes), broccoli with cheese or ranch, green beans, peas, beets as vegetable options. Pt loves veggie tots.     Preferred Learning Style:  No preference indicated   Learning Readiness:  Ready Change in progress   MEDICATIONS: See list   DIETARY INTAKE:  Usual eating pattern includes 3 meals and 1 snack per day.  Everyday foods include yogurt, nuts, berries, veggie tots, fried chicken, green beans, steamed broccoli, snow peas. Avoided foods include some vegetables.    24-hr recall:  B (8 AM): 2 blueberry waffles + trail mix (cereal/almonds, grapes) + sugar-free syrup Snk (10 AM): homemade trail mix (dry cereal + almonds + grapes) L (12-1 PM): salad with dijon dressing (spinach, noodies, fruit, almonds, cheese, egg) + trail mix Snk (2 PM):  D (6-7 PM): veggie patty + mac and cheese + fotatoes + almonds/trail mix  Snk (PM):   Beverages: water (64 oz), coffee, sparkling water, diet soda  (occasionally), Fair Life skim milk (in cereal)  Usual physical activity: walking 20 min, 7 days/week; squatting and bending while at work; 3 days/week  Estimated energy needs: 2000-2200 calories 225-248 g carbohydrates 150-165 g protein 56-61 g fat  Progress Towards Goal(s):  Some progress.   Nutritional Diagnosis:  NB-1.5 Disordered eating pattern As related to balanced meals.  As evidenced by pt reports indulging.    Intervention:  Nutrition education and counseling. Discussed goals from previous visit and ways to create habits.Discussed ways to have consistent movement.   Teaching Method Utilized:  Visual Auditory Hands on  Handouts given during visit include: none  Barriers to learning/adherence to lifestyle change: none identified  Demonstrated degree of understanding via:  Teach Back   Monitoring/Evaluation:  Dietary intake, exercise, and body weight in 1 month(s).

## 2021-07-13 ENCOUNTER — Other Ambulatory Visit: Payer: Self-pay

## 2021-07-13 ENCOUNTER — Encounter: Payer: Medicare HMO | Attending: Family Medicine | Admitting: Registered"

## 2021-07-13 ENCOUNTER — Encounter: Payer: Self-pay | Admitting: Registered"

## 2021-07-13 DIAGNOSIS — E1165 Type 2 diabetes mellitus with hyperglycemia: Secondary | ICD-10-CM | POA: Diagnosis not present

## 2021-07-13 NOTE — Progress Notes (Signed)
Medical Nutrition Therapy:  Appt start time: 10:05 end time: 10:55  Assessment: Primary concerns today: Reports increasing BS numbers. States she hasn't been able to walk lately due to hot weather conditions. Diagnosed with diabetes at least 10 years ago. Reports increasing water intake to drinking 1 gallon of water/day but states its not helping lower BS numbers. States she is daily checking FBS ([phone]). States she is unsure of when to notify PCP regarding BS numbers.   Previous appts: Reports next appt with PCP is 9/26. States she does not recall A1c from PCP appt. On days when she eats evening snack of multiple items, FBS is elevated. On days when evening snack is nonexistent, FBS are closer to 130. 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 States she can sense satisfaction when eating.   Pt states she likes butternut squash, cauliflower (faux potatoes), broccoli with cheese or ranch, green beans, peas, beets as vegetable options. Pt loves veggie tots.     Preferred Learning Style:  No preference indicated   Learning Readiness:  Ready Change in progress   MEDICATIONS: See list   DIETARY INTAKE:  Usual eating pattern includes 3 meals and 1 snack per day.  Everyday foods include yogurt, nuts, berries, veggie tots, fried chicken, green beans, steamed broccoli, snow peas. Avoided foods include some vegetables.    24-hr recall:  B (8 AM): 2 blueberry waffles + trail mix (cereal/almonds, grapes) + sugar-free syrup Snk (10 AM): homemade trail mix (dry cereal + almonds + grapes) L (12-1 PM): [employer] - 1/2 Pimento cheese sandwich + pasta salad  Snk (2 PM):  D (6-7 PM): veggie patty + mac and cheese + cauliflower potatoes + almonds/trail mix  Snk (PM):   Beverages: water (64 oz), coffee, sparkling water, diet soda (occasionally), Fair Life skim  milk (in cereal)  Usual physical activity: walking 20 min, 7 days/week; squatting and bending while at work; 3 days/week  Estimated energy needs: 2000-2200 calories 225-248 g carbohydrates 150-165 g protein 56-61 g fat  Progress Towards Goal(s):  Some progress.   Nutritional Diagnosis:  NB-1.5 Disordered eating pattern As related to balanced meals.  As evidenced by pt reports indulging.    Intervention:  Nutrition education and counseling. Discussed goals from previous visit and ways to create habits. Discussed thoughts related to having continuous glucose monitoring along with potential pros and cons.  Goals: - Be intentional about having at least 1/2 of meal as non-starchy vegetables when eating out.   Teaching Method Utilized:  Visual Auditory Hands on  Handouts given during visit include: none  Barriers to learning/adherence to lifestyle change: none identified  Demonstrated degree of understanding via:  Teach Back   Monitoring/Evaluation:  Dietary intake, exercise, and body weight in 1 month(s).

## 2021-07-14 NOTE — Patient Instructions (Signed)
-   Be intentional about having at least 1/2 of meal as non-starchy vegetables when eating out.

## 2021-08-10 ENCOUNTER — Ambulatory Visit: Payer: Medicare HMO | Admitting: Registered"

## 2021-08-17 ENCOUNTER — Encounter: Payer: Medicare HMO | Attending: Family Medicine | Admitting: Registered"

## 2021-08-17 ENCOUNTER — Other Ambulatory Visit: Payer: Self-pay

## 2021-08-17 ENCOUNTER — Encounter: Payer: Self-pay | Admitting: Registered"

## 2021-08-17 DIAGNOSIS — E119 Type 2 diabetes mellitus without complications: Secondary | ICD-10-CM | POA: Insufficient documentation

## 2021-08-17 NOTE — Patient Instructions (Addendum)
-   Try having salads for lunch and dinner on Tuesdays and Fridays.   - Walk for exercise when able.

## 2021-08-17 NOTE — Progress Notes (Signed)
Medical Nutrition Therapy:  Appt start time: 2:03 end time: 2:54  Assessment: Primary concerns today: Pt arrives stating she has run out of strips. Reports recent BS numbers [phone]). States she has been busier nowadays on Tuesdays; not as consistent with physical activity beyond work-related activity. Reports she eats meals alone most times while watching tv at home, unless eating out with family. Reports continuing to drink 1 gallon of water/day.   Previous appts: Diagnosed with diabetes at least 10 years ago. Reports next appt with PCP is 9/26. States she does not recall A1c from PCP appt. On days when she eats evening snack of multiple items, FBS is elevated. On days when evening snack is nonexistent, FBS are closer to 130. 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 States she can sense satisfaction when eating.   Pt states she likes butternut squash, cauliflower (faux potatoes), broccoli with cheese or ranch, green beans, peas, beets as vegetable options. Pt loves veggie tots.     Preferred Learning Style:  No preference indicated   Learning Readiness:  Ready Change in progress   MEDICATIONS: See list   DIETARY INTAKE:  Usual eating pattern includes 3 meals and 1 snack per day.  Everyday foods include yogurt, nuts, berries, veggie tots, fried chicken, green beans, steamed broccoli, snow peas. Avoided foods include some vegetables.    24-hr recall:  B (8 AM): 2 blueberry waffles + trail mix (cereal/almonds, grapes) + sugar-free syrup Snk (10 AM): homemade trail mix (dry cereal + almonds + grapes) L (12-1 PM): [employer] - 1/2 Pimento cheese sandwich + pasta salad  Snk (2 PM):  D (6-7 PM): veggie patty + mac and cheese + cauliflower potatoes + almonds/trail mix  Snk (PM):   Beverages: water (64 oz), coffee, sparkling water, diet soda  (occasionally), Fair Life skim milk (in cereal)  Usual physical activity: walking 20 min, some days/week;   Estimated energy needs: 2000-2200 calories 225-248 g carbohydrates 150-165 g protein 56-61 g fat  Progress Towards Goal(s):  Some progress.   Nutritional Diagnosis:  NB-1.5 Disordered eating pattern As related to balanced meals.  As evidenced by pt reports indulging.    Intervention:  Nutrition education and counseling. Discussed goals from previous visit and ways to create habits. Discussed thoughts related to having continuous glucose monitoring along with potential pros and cons. Discussed trying to have salads as dinner option as well to see how it affects BS numbers and hunger/satiety levels. Pt agreed with goals.   Goals: - Try having salads for lunch and dinner on Tuesdays and Fridays.  - Walk for exercise when able.   Teaching Method Utilized:  Visual Auditory Hands on  Handouts given during visit include: none  Barriers to learning/adherence to lifestyle change: none identified  Demonstrated degree of understanding via:  Teach Back   Monitoring/Evaluation:  Dietary intake, exercise, and body weight in 1 month(s).

## 2021-08-18 ENCOUNTER — Encounter: Payer: Self-pay | Admitting: Podiatry

## 2021-08-18 ENCOUNTER — Ambulatory Visit (INDEPENDENT_AMBULATORY_CARE_PROVIDER_SITE_OTHER): Payer: Medicare HMO | Admitting: Podiatry

## 2021-08-18 DIAGNOSIS — B351 Tinea unguium: Secondary | ICD-10-CM | POA: Diagnosis not present

## 2021-08-18 DIAGNOSIS — M79609 Pain in unspecified limb: Secondary | ICD-10-CM | POA: Diagnosis not present

## 2021-08-18 DIAGNOSIS — G809 Cerebral palsy, unspecified: Secondary | ICD-10-CM

## 2021-08-18 DIAGNOSIS — E119 Type 2 diabetes mellitus without complications: Secondary | ICD-10-CM | POA: Diagnosis not present

## 2021-08-18 NOTE — Progress Notes (Signed)
This patient returns to my office for at risk foot care.  This patient requires this care by a professional since this patient will be at risk due to having diabetes.  This patient is unable to cut nails herself since the patient cannot reach her nails.These nails are painful walking and wearing shoes.  This patient presents for at risk foot care today.    General Appearance  Alert, conversant and in no acute stress.  Vascular  Dorsalis pedis and posterior tibial  pulses are weakly  palpable  due to swelling  bilaterally.  Capillary return is within normal limits  bilaterally. Temperature is within normal limits  Bilaterally. Swelling feet/legs  B/L.  Neurologic  Senn-Weinstein monofilament wire test within normal limits  bilaterally. Muscle power within normal limits bilaterally.  Nails Thick disfigured discolored nails with subungual debris  from hallux to fifth toes bilaterally. No evidence of bacterial infection or drainage bilaterally.  Orthopedic  No limitations of motion  feet .  No crepitus or effusions noted.  No bony pathology or digital deformities noted.  Skin  normotropic skin with no porokeratosis noted bilaterally.  No signs of infections or ulcers noted.     Onychomycosis  Pain in right toes  Pain in left toes  Consent was obtained for treatment procedures.   Mechanical debridement of nails 1-5  bilaterally performed with a nail nipper.  Patient requests no dremel tool usage. Reddish discoloration of top of both feet.  Told patient to watch the redness.   Return office visit   3 months                   Told patient to return for periodic foot care and evaluation due to potential at risk complications.   Ileene Allie DPM  

## 2021-08-30 DIAGNOSIS — G809 Cerebral palsy, unspecified: Secondary | ICD-10-CM | POA: Diagnosis not present

## 2021-08-30 DIAGNOSIS — N181 Chronic kidney disease, stage 1: Secondary | ICD-10-CM | POA: Diagnosis not present

## 2021-08-30 DIAGNOSIS — E1121 Type 2 diabetes mellitus with diabetic nephropathy: Secondary | ICD-10-CM | POA: Diagnosis not present

## 2021-08-30 DIAGNOSIS — E7800 Pure hypercholesterolemia, unspecified: Secondary | ICD-10-CM | POA: Diagnosis not present

## 2021-09-14 ENCOUNTER — Encounter: Payer: Medicare HMO | Attending: Family Medicine | Admitting: Registered"

## 2021-09-14 ENCOUNTER — Encounter: Payer: Self-pay | Admitting: Registered"

## 2021-09-14 ENCOUNTER — Other Ambulatory Visit: Payer: Self-pay

## 2021-09-14 DIAGNOSIS — E119 Type 2 diabetes mellitus without complications: Secondary | ICD-10-CM | POA: Diagnosis not present

## 2021-09-14 NOTE — Progress Notes (Signed)
  Medical Nutrition Therapy:  Appt start time: 2:00 end time: 2:55  Assessment: Primary concerns today: Pt arrives stating she was told by PCP to increase insulin 2 units weekly until FBS numbers decrease to 130; currently at 16 units/day. Reports recent BS numbers (160-737); decreased FBS from previous visit. States she didn't have her blood drawn at recent PCP appointment due to her numbers still being high; will have it checked 12/2021 after insulin changes.   States she has been eating PBJ and banana sandwich + salad for lunch. States she rotates insulin injection sites on her stomach. States she wants to walk in the mornings while weather is conducive. Reports rest days will be 4 days a week (Sunday, Tuesday, Wednesday, and Thursday due to church and work). States she can make walking happen on the mornings of Monday, Friday, and Saturday.   Reports continuing to drink 1/2 gallon of water/day.   Previous appts: Diagnosed with diabetes at least 10 years ago. Reports next appt with PCP is 9/26. States she does not recall A1c from PCP appt. On days when she eats evening snack of multiple items, FBS is elevated. On days when evening snack is nonexistent, FBS are closer to 130. 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 States she can sense satisfaction when eating.   Pt states she likes butternut squash, cauliflower (faux potatoes), broccoli with cheese or ranch, green beans, peas, beets as vegetable options. Pt loves veggie tots.     Preferred Learning Style:  No preference indicated   Learning Readiness:  Ready Change in progress   MEDICATIONS: See list   DIETARY INTAKE:  Usual eating pattern includes 3 meals and 1 snack per day.  Everyday foods include yogurt, nuts, berries, veggie tots, fried chicken, green beans, steamed broccoli, snow peas.  Avoided foods include some vegetables.    24-hr recall:  B (8 AM): trail mix (cereal/almonds, grapes) Snk (10 AM): homemade trail mix (dry cereal + almonds + grapes) L (12-1 PM): [employer] - 1/2 ham, lettuce, tomato sandwich + pasta salad  Snk (2 PM): trail mix, almonds, grapes, combos, cookies D (6-7 PM): salad with vinaigrette dressing + greek yogurt with cereal/almonds/trail mix/grapes Snk (PM):   Beverages: water (64 oz), coffee, sparkling water, diet soda (occasionally)  Usual physical activity: walking 20 min, once day/week  Estimated energy needs: 2000-2200 calories 225-248 g carbohydrates 150-165 g protein 56-61 g fat  Progress Towards Goal(s):  Some progress.   Nutritional Diagnosis:  NB-1.5 Disordered eating pattern As related to balanced meals.  As evidenced by pt reports indulging.    Intervention:  Nutrition education and counseling. Discussed positive changes to FBS since insulin changes. Encouraged with lifestyle changes and discussed how to maximize movement during this time. Pt agreed with goals.   Goals: - Plan to walk for 20 minutes, 3 times a week - Monday, Friday, and Saturday mornings.  - Continue as instructed by PCP with increasing insulin dosage.  - Keep up the the great work with food and fluid intake.   Teaching Method Utilized:  Visual Auditory Hands on  Handouts given during visit include: none  Barriers to learning/adherence to lifestyle change: none identified  Demonstrated degree of understanding via:  Teach Back   Monitoring/Evaluation:  Dietary intake, exercise, and body weight in 1 month(s).

## 2021-09-14 NOTE — Patient Instructions (Signed)
-   Plan to walk for 20 minutes, 3 times a week - Monday, Friday, and Saturday mornings.   - Continue as instructed by PCP with increasing insulin dosage.   - Keep up the the great work with food and fluid intake.

## 2021-10-12 ENCOUNTER — Other Ambulatory Visit: Payer: Self-pay

## 2021-10-12 ENCOUNTER — Encounter: Payer: Medicare HMO | Attending: Family Medicine | Admitting: Registered"

## 2021-10-12 ENCOUNTER — Encounter: Payer: Self-pay | Admitting: Registered"

## 2021-10-12 DIAGNOSIS — E119 Type 2 diabetes mellitus without complications: Secondary | ICD-10-CM

## 2021-10-12 NOTE — Progress Notes (Signed)
  Medical Nutrition Therapy:  Appt start time: 2:00 end time: 3:00  Assessment: Primary concerns today: Pt arrives states she is continuing to increase insulin intake on a weekly basis. Currently at 24 units nightly. FBS are averaging 200-419 since previous visit. States her BS was increased Monday morning at 419 due to eating more carbohydrates than usual Sunday after church because food was offered to her. Pt states she didn't have a great day Monday because she found out she has 4 cavities and mom shamed her a little bit related to "everything". Pt states she knows what she should be doing and knows they need to engage in physical activity. Pt states it is hard.   Reports continuing to drink 1/2 gallon of water/day.   Previous appts: Diagnosed with diabetes at least 10 years ago. Reports next appt with PCP is 9/26. States she does not recall A1c from PCP appt. On days when she eats evening snack of multiple items, FBS is elevated. On days when evening snack is nonexistent, FBS are closer to 130. 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 States she can sense satisfaction when eating.   Pt states she likes butternut squash, cauliflower (faux potatoes), broccoli with cheese or ranch, green beans, peas, beets as vegetable options. Pt loves veggie tots.     Preferred Learning Style:  No preference indicated   Learning Readiness:  Ready Change in progress   MEDICATIONS: See list   DIETARY INTAKE:  Usual eating pattern includes 3 meals and 1 snack per day.  Everyday foods include yogurt, nuts, berries, veggie tots, fried chicken, green beans, steamed broccoli, snow peas. Avoided foods include some vegetables.    24-hr recall:  B (8 AM): trail mix (cereal/almonds, grapes) Snk (10 AM): homemade trail mix (dry cereal + almonds + grapes) L (12-1 PM):  [employer] - 1/2 ham, lettuce, tomato sandwich + pasta salad  Snk (2 PM): trail mix, almonds, grapes, combos, cookies D (6-7 PM): salad with vinaigrette dressing + greek yogurt with cereal/almonds/trail mix/grapes Snk (PM):   Beverages: water (64 oz), coffee, sparkling water, diet soda (occasionally)  Usual physical activity: walking 20 min, once day/week  Estimated energy needs: 2000-2200 calories 225-248 g carbohydrates 150-165 g protein 56-61 g fat  Progress Towards Goal(s):  Some progress.   Nutritional Diagnosis:  NB-1.5 Disordered eating pattern As related to balanced meals.  As evidenced by pt reports indulging.    Intervention:  Nutrition education and counseling. Mainly listened and discussed  ways to have conversations with mom about being supportive. Discussed ways to balance meals when offered items she didn't prepare. Pt agreed with goals.   Goals: - Plan to walk for 20 minutes, 3 times a week - Monday, Friday, and Saturday mornings.  - Continue as instructed by PCP with increasing insulin dosage.  - Keep up the the great work with food and fluid intake.   Teaching Method Utilized:  Visual Auditory Hands on  Handouts given during visit include: none  Barriers to learning/adherence to lifestyle change: none identified  Demonstrated degree of understanding via:  Teach Back   Monitoring/Evaluation:  Dietary intake, exercise, and body weight in 1 month(s).

## 2021-11-08 DIAGNOSIS — H5213 Myopia, bilateral: Secondary | ICD-10-CM | POA: Diagnosis not present

## 2021-11-08 DIAGNOSIS — Z7984 Long term (current) use of oral hypoglycemic drugs: Secondary | ICD-10-CM | POA: Diagnosis not present

## 2021-11-08 DIAGNOSIS — E119 Type 2 diabetes mellitus without complications: Secondary | ICD-10-CM | POA: Diagnosis not present

## 2021-11-08 DIAGNOSIS — H52203 Unspecified astigmatism, bilateral: Secondary | ICD-10-CM | POA: Diagnosis not present

## 2021-11-08 DIAGNOSIS — Z794 Long term (current) use of insulin: Secondary | ICD-10-CM | POA: Diagnosis not present

## 2021-11-09 ENCOUNTER — Other Ambulatory Visit: Payer: Self-pay

## 2021-11-09 ENCOUNTER — Encounter: Payer: Medicare HMO | Attending: Family Medicine | Admitting: Registered"

## 2021-11-09 DIAGNOSIS — E119 Type 2 diabetes mellitus without complications: Secondary | ICD-10-CM | POA: Insufficient documentation

## 2021-11-09 NOTE — Progress Notes (Signed)
  Medical Nutrition Therapy:  Appt start time: 2:00 end time: 3:00  Assessment: Primary concerns today: Pt states she had an eye exam yesterday; foot exam is in 2 weeks. Reports she is continuing to increase insulin dose by 2 units each week, current FBS (500-938). Currently at 32 units a day.   Current medication usage:  Xultophy (combination of degludec and liraglutide) - takes at night  Losartan  Metformin Low dose aspirin  Reports continuing to drink 1/2 gallon of water/day.   Previous appts: Diagnosed with diabetes at least 10 years ago. Reports next appt with PCP is 9/26. States she does not recall A1c from PCP appt. On days when she eats evening snack of multiple items, FBS is elevated. On days when evening snack is nonexistent, FBS are closer to 130. 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 States she can sense satisfaction when eating.   Pt states she likes butternut squash, cauliflower (faux potatoes), broccoli with cheese or ranch, green beans, peas, beets as vegetable options. Pt loves veggie tots.     Preferred Learning Style:  No preference indicated   Learning Readiness:  Ready Change in progress   MEDICATIONS: See list   DIETARY INTAKE:  Usual eating pattern includes 3 meals and 1 snack per day.  Everyday foods include yogurt, nuts, berries, veggie tots, fried chicken, green beans, steamed broccoli, snow peas. Avoided foods include some vegetables.    24-hr recall:  B (8 AM): trail mix (cereal/almonds/grapes) Snk (11 AM): 2 pop tarts + homemade trail mix (dry cereal + almonds + grapes) L (12-1 PM): salad + PBJ banana sandwich or [employer]-1/2 pimento cheese sandwich + pasta salad Snk (2 PM): PB pretzel bites + grapes/trail mix/almonds D (6-7 PM): salad with vinaigrette dressing + greek yogurt with cereal/almonds/trail  mix/grapes Snk (PM):   Beverages: water (64 oz), coffee (20 oz), sparkling water (33 oz), diet soda (occasionally); 117+ oz  Usual physical activity: walking 20 min, once day/week  Estimated energy needs: 2000-2200 calories 225-248 g carbohydrates 150-165 g protein 56-61 g fat  Progress Towards Goal(s):  Some progress.   Nutritional Diagnosis:  NB-1.5 Disordered eating pattern As related to balanced meals.  As evidenced by pt reports indulging.    Intervention:  Nutrition education and counseling. Mainly listened and discussed ways to help lower FBS. Pt agreed with goals.   Goals: - Find out about next PCP visit with medication updates related to FBS changes  Teaching Method Utilized:  Visual Auditory Hands on  Handouts given during visit include: none  Barriers to learning/adherence to lifestyle change: none identified  Demonstrated degree of understanding via:  Teach Back   Monitoring/Evaluation:  Dietary intake, exercise, and body weight in 1 month(s).

## 2021-11-15 ENCOUNTER — Other Ambulatory Visit: Payer: Self-pay | Admitting: Podiatry

## 2021-11-16 NOTE — Telephone Encounter (Signed)
Please advise 

## 2021-11-22 ENCOUNTER — Other Ambulatory Visit: Payer: Self-pay | Admitting: *Deleted

## 2021-11-24 ENCOUNTER — Other Ambulatory Visit: Payer: Self-pay

## 2021-11-24 ENCOUNTER — Ambulatory Visit (INDEPENDENT_AMBULATORY_CARE_PROVIDER_SITE_OTHER): Payer: Medicare HMO | Admitting: Podiatry

## 2021-11-24 ENCOUNTER — Encounter: Payer: Self-pay | Admitting: Podiatry

## 2021-11-24 DIAGNOSIS — B351 Tinea unguium: Secondary | ICD-10-CM | POA: Diagnosis not present

## 2021-11-24 DIAGNOSIS — M79609 Pain in unspecified limb: Secondary | ICD-10-CM

## 2021-11-24 DIAGNOSIS — G809 Cerebral palsy, unspecified: Secondary | ICD-10-CM

## 2021-11-24 DIAGNOSIS — E119 Type 2 diabetes mellitus without complications: Secondary | ICD-10-CM | POA: Diagnosis not present

## 2021-11-24 NOTE — Progress Notes (Signed)
This patient returns to my office for at risk foot care.  This patient requires this care by a professional since this patient will be at risk due to having diabetes.  This patient is unable to cut nails herself since the patient cannot reach her nails.These nails are painful walking and wearing shoes.  This patient presents for at risk foot care today.    General Appearance  Alert, conversant and in no acute stress.  Vascular  Dorsalis pedis and posterior tibial  pulses are weakly  palpable  due to swelling  bilaterally.  Capillary return is within normal limits  bilaterally. Temperature is within normal limits  Bilaterally. Swelling feet/legs  B/L.  Neurologic  Senn-Weinstein monofilament wire test within normal limits  bilaterally. Muscle power within normal limits bilaterally.  Nails Thick disfigured discolored nails with subungual debris  from hallux to fifth toes bilaterally. No evidence of bacterial infection or drainage bilaterally.  Orthopedic  No limitations of motion  feet .  No crepitus or effusions noted.  No bony pathology or digital deformities noted.  Skin  normotropic skin with no porokeratosis noted bilaterally.  No signs of infections or ulcers noted.   Red swollen feet  B/L.  Onychomycosis  Pain in right toes  Pain in left toes  Consent was obtained for treatment procedures.   Mechanical debridement of nails 1-5  bilaterally performed with a nail nipper.  Patient requests no dremel tool usage. Reddish discoloration of top of both feet.  Told patient to watch the redness.   Return office visit 10 weeks                  Told patient to return for periodic foot care and evaluation due to potential at risk complications.   Ikram Riebe DPM  

## 2021-12-14 ENCOUNTER — Other Ambulatory Visit: Payer: Self-pay

## 2021-12-14 ENCOUNTER — Encounter: Payer: Medicare HMO | Attending: Family Medicine | Admitting: Registered"

## 2021-12-14 ENCOUNTER — Encounter: Payer: Self-pay | Admitting: Registered"

## 2021-12-14 DIAGNOSIS — E119 Type 2 diabetes mellitus without complications: Secondary | ICD-10-CM | POA: Diagnosis not present

## 2021-12-14 NOTE — Progress Notes (Signed)
°  Medical Nutrition Therapy:  Appt start time: 2:05 end time: 3:00  Assessment: Primary concerns today: Pt states she had an eye exam and foot exam since previous visit and everything is going well. Reports she is continuing to increase insulin dose by 2 units each week, current FBS (976-734). Currently at 42 units a day.   Current medication usage:  Xultophy (combination of degludec and liraglutide) - takes at night  Losartan  Metformin Low dose aspirin  Reports continuing to drink 1/2 gallon of water/day. States she has been walking about once a week.   Previous appts: Diagnosed with diabetes at least 10 years ago. Reports next appt with PCP is 9/26. States she does not recall A1c from PCP appt. On days when she eats evening snack of multiple items, FBS is elevated. On days when evening snack is nonexistent, FBS are closer to 130. 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 States she can sense satisfaction when eating.   Pt states she likes butternut squash, cauliflower (faux potatoes), broccoli with cheese or ranch, green beans, peas, beets as vegetable options. Pt loves veggie tots.     Preferred Learning Style:  No preference indicated   Learning Readiness:  Ready Change in progress   MEDICATIONS: See list   DIETARY INTAKE:  Usual eating pattern includes 3 meals and 1 snack per day.  Everyday foods include yogurt, nuts, berries, veggie tots, fried chicken, green beans, steamed broccoli, snow peas. Avoided foods include some vegetables.    24-hr recall:  B (8 AM): trail mix (cereal/almonds/grapes) Snk (11 AM): 2 pop tarts + homemade trail mix (dry cereal + almonds) + snickerdoodle L (12-1 PM): salad + PBJ banana sandwich or [employer]-apple pecan salad + fries + soda Snk (2 PM): PB pretzel bites + grapes/trail mix/almonds D (6-7 PM):  salad with vinaigrette dressing + greek yogurt with cereal/almonds/trail mix/grapes Snk (PM):   Beverages: water (64 oz), coffee (20 oz), sparkling water (33 oz), diet soda (occasionally); 117+ oz  Usual physical activity: walking 20 min, once day/week  Estimated energy needs: 2000-2200 calories 225-248 g carbohydrates 150-165 g protein 56-61 g fat  Progress Towards Goal(s):  Some progress.   Nutritional Diagnosis:  NB-1.5 Disordered eating pattern As related to balanced meals.  As evidenced by pt reports indulging.    Intervention:  Nutrition education and counseling. Mainly listened and discussed importance of getting established with new PCP. Pt agreed with goals.   Goals: - Find out about next PCP visit with medication updates related to FBS changes  Teaching Method Utilized:  Visual Auditory Hands on  Handouts given during visit include: none  Barriers to learning/adherence to lifestyle change: none identified  Demonstrated degree of understanding via:  Teach Back   Monitoring/Evaluation:  Dietary intake, exercise, and body weight in 1 month(s).

## 2021-12-27 DIAGNOSIS — E1121 Type 2 diabetes mellitus with diabetic nephropathy: Secondary | ICD-10-CM | POA: Diagnosis not present

## 2021-12-27 DIAGNOSIS — Z7984 Long term (current) use of oral hypoglycemic drugs: Secondary | ICD-10-CM | POA: Diagnosis not present

## 2022-01-03 DIAGNOSIS — N181 Chronic kidney disease, stage 1: Secondary | ICD-10-CM | POA: Diagnosis not present

## 2022-01-03 DIAGNOSIS — E1121 Type 2 diabetes mellitus with diabetic nephropathy: Secondary | ICD-10-CM | POA: Diagnosis not present

## 2022-01-03 DIAGNOSIS — E7800 Pure hypercholesterolemia, unspecified: Secondary | ICD-10-CM | POA: Diagnosis not present

## 2022-01-18 ENCOUNTER — Other Ambulatory Visit: Payer: Self-pay

## 2022-01-18 ENCOUNTER — Encounter: Payer: Medicare HMO | Attending: Family Medicine | Admitting: Registered"

## 2022-01-18 DIAGNOSIS — E119 Type 2 diabetes mellitus without complications: Secondary | ICD-10-CM

## 2022-01-18 DIAGNOSIS — Z713 Dietary counseling and surveillance: Secondary | ICD-10-CM | POA: Insufficient documentation

## 2022-01-18 DIAGNOSIS — E1121 Type 2 diabetes mellitus with diabetic nephropathy: Secondary | ICD-10-CM | POA: Insufficient documentation

## 2022-01-18 NOTE — Progress Notes (Signed)
Medical Nutrition Therapy:  Appt start time: 2:05 end time: 3:00  Assessment: Primary concerns today: Pt arrives today stating she had recent visit with PCP and recalls recent A1c. States her recent A1c was 11.4% and increase from 11.2%. States she was referred to a dietitian within the [facility] system and has upcoming appt next week, 2/21. Reports she has another appt with PCP in 03/2022.  States she is unaware of any other abnormal lab values from PCP visit.   Reports she is continuing to increase insulin dose by 2 units each week, current FBS (093-235). Currently at 50 units a day. States she is unsure of what the next steps are with her insulin dosage after this week.   States she is planning to take a break from having grapes and will replace them with 1/2 cup of blackberries or blueberries. Pt states she is frustrated with her diabetes journey. States she feels like her A1c numbers are high because she likes to eat junk food like M&Ms. States she is interested in medication changes to help with decreasing glucose numbers.   Current medication usage:  Xultophy (combination of degludec and liraglutide) - takes at night  Losartan  Metformin Low dose aspirin  Reports continuing to drink 1/2 gallon of water/day. States she has been walking about once a week.   Previous appts: Diagnosed with diabetes at least 10 years ago. Reports next appt with PCP is 9/26. States she does not recall A1c from PCP appt. On days when she eats evening snack of multiple items, FBS is elevated. On days when evening snack is nonexistent, FBS are closer to 130. 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 States she can sense satisfaction when eating.   Pt states she likes butternut squash, cauliflower (faux potatoes), broccoli with cheese or ranch, green beans, peas, beets as  vegetable options. Pt loves veggie tots.     Preferred Learning Style:  No preference indicated   Learning Readiness:  Ready Change in progress   MEDICATIONS: See list   DIETARY INTAKE:  Usual eating pattern includes 3 meals and 1 snack per day.  Everyday foods include yogurt, nuts, berries, veggie tots, fried chicken, green beans, steamed broccoli, snow peas. Avoided foods include some vegetables.    24-hr recall:  B (8 AM): trail mix (cereal/almonds/grapes) Snk (11 AM): 2 pop tarts + homemade trail mix (dry cereal + almonds) + snickerdoodle L (12-1 PM): [employer] - 1/2 grilled chicken wrap (with lettuce) + 1/4 french fries + diet Dr. Reino Kent Snk (2 PM): PB pretzel bites + grapes/trail mix/almonds D (6-7 PM): salad with vinaigrette dressing + greek yogurt with cereal/almonds/trail mix/grapes Snk (PM):   Beverages: water (64 oz), coffee (20 oz), sparkling water (33 oz), diet soda (occasionally); 117+ oz  Usual physical activity: walking 20 min, once day/week  Estimated energy needs: 2000-2200 calories 225-248 g carbohydrates 150-165 g protein 56-61 g fat  Progress Towards Goal(s):  Some progress.   Nutritional Diagnosis:  NB-1.5 Disordered eating pattern As related to balanced meals.  As evidenced by pt reports indulging.    Intervention:  Nutrition education and counseling. Mainly listened and talked through patient's frustrations with self-care management related to diabetes. Pt states she would like for me to connect with new RD for collaboration of care; signed ROI (01/18/2022).   Teaching Method  Utilized:  International aid/development worker on  Handouts given during visit include: none  Barriers to learning/adherence to lifestyle change: none identified  Demonstrated degree of understanding via:  Teach Back   Monitoring/Evaluation:  Dietary intake, exercise, and body weight in 1 month(s).

## 2022-01-25 DIAGNOSIS — N181 Chronic kidney disease, stage 1: Secondary | ICD-10-CM | POA: Diagnosis not present

## 2022-01-25 DIAGNOSIS — E1121 Type 2 diabetes mellitus with diabetic nephropathy: Secondary | ICD-10-CM | POA: Diagnosis not present

## 2022-01-25 DIAGNOSIS — Z6841 Body Mass Index (BMI) 40.0 and over, adult: Secondary | ICD-10-CM | POA: Diagnosis not present

## 2022-01-25 DIAGNOSIS — E7800 Pure hypercholesterolemia, unspecified: Secondary | ICD-10-CM | POA: Diagnosis not present

## 2022-02-15 ENCOUNTER — Other Ambulatory Visit: Payer: Self-pay

## 2022-02-15 ENCOUNTER — Encounter: Payer: Medicare HMO | Attending: Family Medicine | Admitting: Registered"

## 2022-02-15 ENCOUNTER — Encounter: Payer: Self-pay | Admitting: Registered"

## 2022-02-15 DIAGNOSIS — E119 Type 2 diabetes mellitus without complications: Secondary | ICD-10-CM | POA: Insufficient documentation

## 2022-02-15 NOTE — Progress Notes (Signed)
?Medical Nutrition Therapy:  Appt start time: 2:02 end time: 3:00 ? ?Assessment: Primary concerns today: Pt arrives today stating her work hours are reduced from 3 days/week to 2 days/week. States she saw another dietitian last month. Reports it was suggested to cut out pop tarts and recommended to purchase hummus. States she does not recall other recommendations. States the papers are at home but she has not looked at them since her appt. Reports she has purchased hummus but has not started eating it yet.  ? ?States she tested FBS one day and it was 189. States she does not recall the details of the day before. States other days FBS (260-301); using 50 units insuline daily.  ? ?States she will have blood work on 3/27, see Dr. Valentina Lucks 4/10 for last visit, and RD in 03/2022 as well.  ? ?States she will have Tuesday mornings free now due to change in work schedule and plans to use the extra time to walk or go to the gym.  ? ?Previous appt: States her recent A1c was 11.4% and increased from 11.2%. States she was referred to a dietitian within the [facility] system and has upcoming appt next week, 2/21. States she is unaware of any other abnormal lab values from PCP visit. Pt states she is frustrated with her diabetes journey. States she feels like her A1c numbers are high because she likes to eat junk food like M&Ms. States she is interested in medication changes to help with decreasing glucose numbers.  ? ?Current medication usage:  ?Xultophy (combination of degludec and liraglutide) - takes at night  ?Losartan  ?Metformin ?Low dose aspirin ? ?Diagnosed with diabetes at least 10 years ago. Reports next appt with PCP is 9/26. States she does not recall A1c from PCP appt. On days when she eats evening snack of multiple items, FBS is elevated. On days when evening snack is nonexistent, FBS are closer to 130. 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 States she can sense satisfaction when eating.  ? ?Pt states she likes butternut squash, cauliflower (faux potatoes), broccoli with cheese or ranch, green beans, peas, beets as vegetable options. Pt loves veggie tots.   ? ? ?Preferred Learning Style:  ?No preference indicated  ? ?Learning Readiness:  ?Ready ?Change in progress ? ? ?MEDICATIONS: See list ?  ?DIETARY INTAKE: ? ?Usual eating pattern includes 3 meals and 2 snacks per day. ? ?Everyday foods include yogurt, nuts, berries, veggie tots, fried chicken, green beans, steamed broccoli, snow peas. Avoided foods include some vegetables.   ? ?24-hr recall:  ?B (8 AM): trail mix (cereal/almonds/grapes) or waffles + almonds + mango pieces  ?Snk (11 AM): frosted mini wheats + almonds  ?L (12-1 PM): [employer] - 1/2 chicken salad sandwich + side salad + diet [employer] ?Snk (2 PM): Combos + mini fig bar + almonds ?D (6-7 PM): 1/2 calzone + salad bowl + greek yogurt with cereal/almonds/trail mix/grapes ?Snk (PM):  ? ?Beverages: water (64 oz), coffee (20 oz), sparkling water (33 oz), diet soda (occasionally); 117+ oz ? ?Usual physical activity: walking 20 min, once day/week ? ?Estimated energy needs: ?2000-2200 calories ?225-248 g carbohydrates ?150-165 g protein ?56-61 g fat ? ?Progress Towards Goal(s):  Some progress. ?  ?Nutritional Diagnosis:  ?NB-1.5 Disordered eating pattern As related to balanced meals.  As  evidenced by pt reports indulging. ?   ?Intervention:  Nutrition education and counseling. Mainly listened and talked through patient's frustrations with self-care management related to diabetes. Pt states she would like for me to connect with new RD for collaboration of care; signed ROI (01/18/2022). Encouraged pt to increase physical activity with increase in free time.  ? ?Teaching Method Utilized:  ?Visual ?Auditory ?Hands on ? ?Handouts given during visit include: ?none ? ?Barriers to  learning/adherence to lifestyle change: none identified ? ?Demonstrated degree of understanding via:  Teach Back  ? ?Monitoring/Evaluation:  Dietary intake, exercise, and body weight in 1 month(s). ? ?

## 2022-02-23 ENCOUNTER — Other Ambulatory Visit: Payer: Self-pay

## 2022-02-23 ENCOUNTER — Ambulatory Visit (INDEPENDENT_AMBULATORY_CARE_PROVIDER_SITE_OTHER): Payer: Medicare HMO | Admitting: Podiatry

## 2022-02-23 ENCOUNTER — Encounter: Payer: Self-pay | Admitting: Podiatry

## 2022-02-23 DIAGNOSIS — E119 Type 2 diabetes mellitus without complications: Secondary | ICD-10-CM

## 2022-02-23 DIAGNOSIS — G8190 Hemiplegia, unspecified affecting unspecified side: Secondary | ICD-10-CM | POA: Insufficient documentation

## 2022-02-23 DIAGNOSIS — G809 Cerebral palsy, unspecified: Secondary | ICD-10-CM | POA: Diagnosis not present

## 2022-02-23 DIAGNOSIS — N181 Chronic kidney disease, stage 1: Secondary | ICD-10-CM | POA: Insufficient documentation

## 2022-02-23 DIAGNOSIS — B351 Tinea unguium: Secondary | ICD-10-CM

## 2022-02-23 DIAGNOSIS — K219 Gastro-esophageal reflux disease without esophagitis: Secondary | ICD-10-CM | POA: Insufficient documentation

## 2022-02-23 DIAGNOSIS — M79609 Pain in unspecified limb: Secondary | ICD-10-CM

## 2022-02-23 DIAGNOSIS — E1121 Type 2 diabetes mellitus with diabetic nephropathy: Secondary | ICD-10-CM | POA: Insufficient documentation

## 2022-02-23 DIAGNOSIS — J309 Allergic rhinitis, unspecified: Secondary | ICD-10-CM | POA: Insufficient documentation

## 2022-02-23 DIAGNOSIS — E7800 Pure hypercholesterolemia, unspecified: Secondary | ICD-10-CM | POA: Insufficient documentation

## 2022-02-23 NOTE — Progress Notes (Signed)
This patient returns to my office for at risk foot care.  This patient requires this care by a professional since this patient will be at risk due to having diabetes.  This patient is unable to cut nails herself since the patient cannot reach her nails.These nails are painful walking and wearing shoes.  This patient presents for at risk foot care today.    General Appearance  Alert, conversant and in no acute stress.  Vascular  Dorsalis pedis and posterior tibial  pulses are weakly  palpable  due to swelling  bilaterally.  Capillary return is within normal limits  bilaterally. Temperature is within normal limits  Bilaterally. Swelling feet/legs  B/L.  Neurologic  Senn-Weinstein monofilament wire test within normal limits  bilaterally. Muscle power within normal limits bilaterally.  Nails Thick disfigured discolored nails with subungual debris  from hallux to fifth toes bilaterally. No evidence of bacterial infection or drainage bilaterally.  Orthopedic  No limitations of motion  feet .  No crepitus or effusions noted.  No bony pathology or digital deformities noted.  Skin  normotropic skin with no porokeratosis noted bilaterally.  No signs of infections or ulcers noted.   Red swollen feet  B/L.  Onychomycosis  Pain in right toes  Pain in left toes  Consent was obtained for treatment procedures.   Mechanical debridement of nails 1-5  bilaterally performed with a nail nipper.  Patient requests no dremel tool usage. Reddish discoloration of top of both feet.  Told patient to watch the redness.   Return office visit 10 weeks                  Told patient to return for periodic foot care and evaluation due to potential at risk complications.   Sandy Blouch DPM  

## 2022-02-28 DIAGNOSIS — E1121 Type 2 diabetes mellitus with diabetic nephropathy: Secondary | ICD-10-CM | POA: Diagnosis not present

## 2022-03-14 DIAGNOSIS — J309 Allergic rhinitis, unspecified: Secondary | ICD-10-CM | POA: Diagnosis not present

## 2022-03-14 DIAGNOSIS — E7800 Pure hypercholesterolemia, unspecified: Secondary | ICD-10-CM | POA: Diagnosis not present

## 2022-03-14 DIAGNOSIS — Z1331 Encounter for screening for depression: Secondary | ICD-10-CM | POA: Diagnosis not present

## 2022-03-14 DIAGNOSIS — Z23 Encounter for immunization: Secondary | ICD-10-CM | POA: Diagnosis not present

## 2022-03-14 DIAGNOSIS — Z0000 Encounter for general adult medical examination without abnormal findings: Secondary | ICD-10-CM | POA: Diagnosis not present

## 2022-03-14 DIAGNOSIS — K219 Gastro-esophageal reflux disease without esophagitis: Secondary | ICD-10-CM | POA: Diagnosis not present

## 2022-03-14 DIAGNOSIS — G809 Cerebral palsy, unspecified: Secondary | ICD-10-CM | POA: Diagnosis not present

## 2022-03-14 DIAGNOSIS — Z1389 Encounter for screening for other disorder: Secondary | ICD-10-CM | POA: Diagnosis not present

## 2022-03-14 DIAGNOSIS — E1121 Type 2 diabetes mellitus with diabetic nephropathy: Secondary | ICD-10-CM | POA: Diagnosis not present

## 2022-03-14 DIAGNOSIS — N181 Chronic kidney disease, stage 1: Secondary | ICD-10-CM | POA: Diagnosis not present

## 2022-03-22 ENCOUNTER — Encounter: Payer: Self-pay | Admitting: Registered"

## 2022-03-22 ENCOUNTER — Encounter: Payer: Medicare HMO | Attending: Family Medicine | Admitting: Registered"

## 2022-03-22 DIAGNOSIS — E1121 Type 2 diabetes mellitus with diabetic nephropathy: Secondary | ICD-10-CM | POA: Insufficient documentation

## 2022-03-22 DIAGNOSIS — Z794 Long term (current) use of insulin: Secondary | ICD-10-CM | POA: Diagnosis not present

## 2022-03-22 DIAGNOSIS — Z713 Dietary counseling and surveillance: Secondary | ICD-10-CM | POA: Insufficient documentation

## 2022-03-22 DIAGNOSIS — E119 Type 2 diabetes mellitus without complications: Secondary | ICD-10-CM

## 2022-03-22 DIAGNOSIS — Z7984 Long term (current) use of oral hypoglycemic drugs: Secondary | ICD-10-CM | POA: Diagnosis not present

## 2022-03-22 NOTE — Progress Notes (Signed)
?Medical Nutrition Therapy:  Appt start time: 2:02 end time: 2:51 ? ?*Pt states she would like for me to connect with new RD for collaboration of care; signed ROI (01/18/2022).  ? ?Assessment: Primary concerns today:  ?Pt arrives stating her diabetes numbers have not changed but has lost 3 lbs since previous PCP appt. States PCP was more excited about weight loss, pt doesn't recall A1c numbers. States she hasn't changed a whole lot since previous PCP appt. States she didn't eat dinner yesterday because she was really tired and fell asleep. States FBS ranges (258 - 420); using 50 units of insulin daily. States she typically goes out to have brunch with dad on Tuesdays. States she typically eats eggs, bacon, biscuit and gravy, and salad. Reports she hasn't been walking much lately but knows she needs to since weather is more conducive and she has an extra day off work. Reports next appt with RD is 4/25. Pt states she does not want be on dialysis and wants to prolong it as long as possible.  ? ?Previous appt: States her recent A1c was 11.4% and increased from 11.2%. States she was referred to a dietitian within the [facility] system and has upcoming appt next week, 2/21. States she is unaware of any other abnormal lab values from PCP visit. Pt states she is frustrated with her diabetes journey. States she feels like her A1c numbers are high because she likes to eat junk food like M&Ms. States she is interested in medication changes to help with decreasing glucose numbers.  ? ?Current medication usage:  ?Xultophy (combination of degludec and liraglutide) - takes at night  ?Losartan  ?Metformin ?Low dose aspirin ? ?Diagnosed with diabetes at least 10 years ago. Reports next appt with PCP is 9/26. States she does not recall A1c from PCP appt. On days when she eats evening snack of multiple items, FBS is elevated. On days when evening snack is nonexistent, FBS are closer to 130. 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 States she can sense satisfaction when eating.  ? ?Pt states she likes butternut squash, cauliflower (faux potatoes), broccoli with cheese or ranch, green beans, peas, beets as vegetable options. Pt loves veggie tots.   ? ? ?Preferred Learning Style:  ?No preference indicated  ? ?Learning Readiness:  ?Ready ?Change in progress ? ? ?MEDICATIONS: See list ?  ?DIETARY INTAKE: ? ?Usual eating pattern includes 3 meals and 2 snacks per day. ? ?Everyday foods include yogurt, nuts, berries, veggie tots, fried chicken, green beans, steamed broccoli, snow peas. Avoided foods include some vegetables.   ? ?24-hr recall:  ?B (8 AM): trail mix (cereal/almonds/grapes) or waffles + almonds + mango pieces  ?Snk (11 AM): frosted mini wheats + almonds  ?L (12-1 PM): [employer] - kids meal - hamburger + fries  ?Snk (2 PM): Combos + mini fig bar + almonds ?D (6-7 PM): skipped or 1/2 calzone + salad bowl + greek yogurt with cereal/almonds/trail mix/grapes ?Snk (PM):  ? ?Beverages: water (64 oz), coffee (20 oz), sparkling water (33 oz), diet soda (occasionally); 117+ oz ? ?Usual physical activity: walking sometimes ? ?Estimated energy needs: ?2000-2200 calories ?225-248 g carbohydrates ?150-165 g protein ?56-61 g fat ? ?Progress Towards Goal(s):  Some progress. ?  ?Nutritional Diagnosis:  ?NB-1.5 Disordered eating pattern As related to balanced meals.  As evidenced by pt reports indulging. ?   ?Intervention:  Nutrition education and counseling. Mainly listened and talked through patient's frustrations with self-care management related to diabetes. Encouraged pt to increase physical activity with increase in free time.  ?Goals: ?- Aim to increase walking to at least 1 time/week.  ? ?Teaching Method Utilized:  ?Visual ?Auditory ?Hands on ? ?Handouts given during visit include: ?none ? ?Barriers to  learning/adherence to lifestyle change: none identified ? ?Demonstrated degree of understanding via:  Teach Back  ? ?Monitoring/Evaluation:  Dietary intake, exercise, and body weight in 1 month(s). ? ?

## 2022-03-22 NOTE — Patient Instructions (Addendum)
-   Aim to increase walking to at least 1 time/week.  ? ?

## 2022-03-29 DIAGNOSIS — E1121 Type 2 diabetes mellitus with diabetic nephropathy: Secondary | ICD-10-CM | POA: Diagnosis not present

## 2022-03-29 DIAGNOSIS — N181 Chronic kidney disease, stage 1: Secondary | ICD-10-CM | POA: Diagnosis not present

## 2022-04-11 ENCOUNTER — Other Ambulatory Visit: Payer: Self-pay | Admitting: Sports Medicine

## 2022-04-12 ENCOUNTER — Encounter: Payer: Self-pay | Admitting: Registered"

## 2022-04-12 ENCOUNTER — Encounter: Payer: Medicare HMO | Attending: Family Medicine | Admitting: Registered"

## 2022-04-12 DIAGNOSIS — Z713 Dietary counseling and surveillance: Secondary | ICD-10-CM | POA: Diagnosis not present

## 2022-04-12 DIAGNOSIS — E119 Type 2 diabetes mellitus without complications: Secondary | ICD-10-CM

## 2022-04-12 DIAGNOSIS — E1121 Type 2 diabetes mellitus with diabetic nephropathy: Secondary | ICD-10-CM | POA: Diagnosis not present

## 2022-04-12 NOTE — Progress Notes (Signed)
?Medical Nutrition Therapy:  Appt start time: 10:10 end time: 11:05 ? ?*Pt states she would like for me to connect with new RD for collaboration of care; signed ROI (01/18/2022).  ? ?Assessment: Primary concerns today: Pt states she celebrated her dad's birthday over the weekend. Reports she ate at [employer] and had a variety of proteins + salad. States she has tried to walk more often since previous visit. States she has been walking at least once a week. States she she recognizes she is not willing to go to a gym due to previously being fired by [employer]. States she is not at the point of being willing to go to a different gym.  ? ?States when she sees her RD, she is weighed, and informed improvement with blood sugar numbers should be correlating with weight decreasing. Says she doesn't remember goals. States her appointments are 15 minutes appts and she doesn't remember what is discussed.  ? ?States FBS ranges (296 - 444); using 50 units of insulin daily.  ? ?Previous appt: States her recent A1c was 11.4% and increased from 11.2%. States she was referred to a dietitian within the [facility] system and has upcoming appt next week, 2/21. States she is unaware of any other abnormal lab values from PCP visit. Pt states she is frustrated with her diabetes journey. States she feels like her A1c numbers are high because she likes to eat junk food like M&Ms. States she is interested in medication changes to help with decreasing glucose numbers. Pt states she does not want be on dialysis and wants to prolong it as long as possible.  ? ?Current medication usage:  ?Xultophy (combination of degludec and liraglutide) - takes at night  ?Losartan  ?Metformin ?Low dose aspirin ? ?Diagnosed with diabetes at least 10 years ago. Reports next appt with PCP is 9/26. States she does not recall A1c from PCP appt. On days when she eats evening snack of multiple items, FBS is elevated. On days when evening snack is nonexistent, FBS  are closer to 130. 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 States she can sense satisfaction when eating.  ? ?Pt states she likes butternut squash, cauliflower (faux potatoes), broccoli with cheese or ranch, green beans, peas, beets as vegetable options. Pt loves veggie tots.   ? ? ?Preferred Learning Style:  ?No preference indicated  ? ?Learning Readiness:  ?Ready ?Change in progress ? ? ?MEDICATIONS: See list ?  ?DIETARY INTAKE: ? ?Usual eating pattern includes 3 meals and 2 snacks per day. ? ?Everyday foods include yogurt, nuts, berries, veggie tots, fried chicken, green beans, steamed broccoli, snow peas. Avoided foods include some vegetables.   ? ?24-hr recall:  ?B (8 AM): trail mix (cereal/almonds/grapes) or waffles + almonds + mango pieces  ?Snk (11 AM): frosted mini wheats + almonds  ?L (12-1 PM): [employer] - kids meal - hamburger + fries  ?Snk (2 PM): Combos + mini fig bar + almonds ?D (6-7 PM): skipped or 1/2 calzone + salad bowl + greek yogurt with cereal/almonds/trail mix/grapes ?Snk (PM):  ? ?Beverages: water (64 oz), coffee (20 oz), sparkling water (33 oz), diet soda (occasionally); 117+ oz ? ?Usual physical activity: walking sometimes ? ?Estimated energy needs: ?2000-2200 calories ?225-248 g carbohydrates ?150-165 g protein ?56-61 g fat ? ?Progress Towards Goal(s):  Some  progress. ?  ?Nutritional Diagnosis:  ?NB-1.5 Disordered eating pattern As related to balanced meals.  As evidenced by pt reports indulging. ?   ?Intervention:  Nutrition education and counseling. Mainly listened and talked through patient's frustrations with self-care management related to diabetes. Discussed opportunity for pt to have free personal training sessions at local gym and pt declined. Encouraged pt to continue to establish foundation of walking once/week.   ?Goals: ?- Continue  walking to at least 1 time/week.  ? ?Teaching Method Utilized:  ?Visual ?Auditory ?Hands on ? ?Handouts given during visit include: ?none ? ?Barriers to learning/adherence to lifestyle change: none identified ? ?Demonstrated degree of understanding via:  Teach Back  ? ?Monitoring/Evaluation:  Dietary intake, exercise, and body weight in 1 month(s). ? ?

## 2022-05-04 ENCOUNTER — Ambulatory Visit (INDEPENDENT_AMBULATORY_CARE_PROVIDER_SITE_OTHER): Payer: Medicare HMO | Admitting: Podiatry

## 2022-05-04 ENCOUNTER — Encounter: Payer: Self-pay | Admitting: Podiatry

## 2022-05-04 DIAGNOSIS — G809 Cerebral palsy, unspecified: Secondary | ICD-10-CM | POA: Diagnosis not present

## 2022-05-04 DIAGNOSIS — E119 Type 2 diabetes mellitus without complications: Secondary | ICD-10-CM | POA: Diagnosis not present

## 2022-05-04 DIAGNOSIS — M79676 Pain in unspecified toe(s): Secondary | ICD-10-CM

## 2022-05-04 DIAGNOSIS — B351 Tinea unguium: Secondary | ICD-10-CM | POA: Diagnosis not present

## 2022-05-04 NOTE — Progress Notes (Signed)
This patient returns to my office for at risk foot care.  This patient requires this care by a professional since this patient will be at risk due to having diabetes.  This patient is unable to cut nails herself since the patient cannot reach her nails.These nails are painful walking and wearing shoes.  This patient presents for at risk foot care today.    General Appearance  Alert, conversant and in no acute stress.  Vascular  Dorsalis pedis and posterior tibial  pulses are weakly  palpable  due to swelling  bilaterally.  Capillary return is within normal limits  bilaterally. Temperature is within normal limits  Bilaterally. Swelling feet/legs  B/L.  Neurologic  Senn-Weinstein monofilament wire test within normal limits  bilaterally. Muscle power within normal limits bilaterally.  Nails Thick disfigured discolored nails with subungual debris  from hallux to fifth toes bilaterally. No evidence of bacterial infection or drainage bilaterally.  Orthopedic  No limitations of motion  feet .  No crepitus or effusions noted.  No bony pathology or digital deformities noted.  Skin  normotropic skin with no porokeratosis noted bilaterally.  No signs of infections or ulcers noted.   Red swollen feet  B/L.  Onychomycosis  Pain in right toes  Pain in left toes  Consent was obtained for treatment procedures.   Mechanical debridement of nails 1-5  bilaterally performed with a nail nipper.  Patient requests no dremel tool usage. Reddish discoloration of top of both feet.  Told patient to watch the redness.   Return office visit 10 weeks                  Told patient to return for periodic foot care and evaluation due to potential at risk complications.   Vineeth Fell DPM  

## 2022-05-17 DIAGNOSIS — N181 Chronic kidney disease, stage 1: Secondary | ICD-10-CM | POA: Diagnosis not present

## 2022-05-17 DIAGNOSIS — E7800 Pure hypercholesterolemia, unspecified: Secondary | ICD-10-CM | POA: Diagnosis not present

## 2022-05-17 DIAGNOSIS — E1121 Type 2 diabetes mellitus with diabetic nephropathy: Secondary | ICD-10-CM | POA: Diagnosis not present

## 2022-05-24 ENCOUNTER — Encounter: Payer: Medicare HMO | Attending: Family Medicine | Admitting: Registered"

## 2022-05-24 ENCOUNTER — Encounter: Payer: Self-pay | Admitting: Registered"

## 2022-05-24 DIAGNOSIS — E1165 Type 2 diabetes mellitus with hyperglycemia: Secondary | ICD-10-CM | POA: Diagnosis not present

## 2022-05-24 DIAGNOSIS — Z713 Dietary counseling and surveillance: Secondary | ICD-10-CM | POA: Insufficient documentation

## 2022-05-24 NOTE — Patient Instructions (Signed)
-   Make appointment with endocrinologist.   - Continue to walk at least 2 times a week for 15-20 minutes.

## 2022-05-24 NOTE — Progress Notes (Signed)
- make appt Medical Nutrition Therapy:  Appt start time: 10:08 end time: 10:50  *Pt states she would like for me to connect with new RD for collaboration of care; signed ROI (01/18/2022).   Assessment: Primary concerns today: Pt states she is walking 2 days/week on Tues and Fri for 15-20 minutes. Pt states she had a scare last week with conversation about amputations. States other RD is recommending her to increase physical activity to improve BS numbers. States she also walks around the grocery store.   States she and dad no longer go to [employer] for breakfast and eat breakfast meal at local dinner to improve eating habits. Reports daily eating habits are still the same.   Reports she will be getting blood work done on Mon, 7/3. States FBS ranges (243 - 413); continuing to use 50 units of insulin nightly.   Previous appt: States her recent A1c was 11.4% and increased from 11.2%. States she was referred to a dietitian within the [facility] system and has upcoming appt next week, 2/21. States she is unaware of any other abnormal lab values from PCP visit. Pt states she is frustrated with her diabetes journey. States she feels like her A1c numbers are high because she likes to eat junk food like M&Ms. States she is interested in medication changes to help with decreasing glucose numbers. Pt states she does not want be on dialysis and wants to prolong it as long as possible.   Current medication usage:  Xultophy (combination of degludec and liraglutide) - takes at night  Losartan  Metformin Low dose aspirin  Diagnosed with diabetes at least 10 years ago. Reports next appt with PCP is 9/26. States she does not recall A1c from PCP appt. On days when she eats evening snack of multiple items, FBS is elevated. On days when evening snack is nonexistent, FBS are closer to 130. 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 States it  damaged the portion of her brain that controls appetite. States she can sense satisfaction when eating.   Pt states she likes butternut squash, cauliflower (faux potatoes), broccoli with cheese or ranch, green beans, peas, beets as vegetable options. Pt loves veggie tots.     Preferred Learning Style:  No preference indicated   Learning Readiness:  Ready Change in progress   MEDICATIONS: See list   DIETARY INTAKE:  Usual eating pattern includes 3 meals and 2 snacks per day.  Everyday foods include yogurt, nuts, berries, veggie tots, fried chicken, green beans, steamed broccoli, snow peas. Avoided foods include some vegetables.    24-hr recall:  B (8 AM): trail mix (cereal/almonds/grapes) or waffles + almonds + mango pieces  Snk (11 AM): frosted mini wheats + almonds  L (12-1 PM): [employer] - kids meal - hamburger + fries  Snk (2 PM): Combos + mini fig bar + almonds D (6-7 PM): skipped or 1/2 calzone + salad bowl + greek yogurt with cereal/almonds/trail mix/grapes Snk (PM):   Beverages: water (64 oz), coffee (20 oz), sparkling water (33 oz), diet soda (occasionally); 117+ oz  Usual physical activity: walking 15-20 min, 2 days/week  Estimated energy needs: 2000-2200 calories 225-248 g carbohydrates 150-165 g protein 56-61 g fat  Progress Towards Goal(s):  Some progress.   Nutritional Diagnosis:  NB-1.5 Disordered eating pattern As related to balanced meals.  As evidenced by pt reports indulging.  Intervention:  Nutrition education and counseling. Mainly listened and affirmed patients choice to increase walking to 2 times/week. Discussed meeting with an endocrinologist to explore other diabetes-medication options. Pt agreed with goals.   Goals: - Continue walking at least 2 times/week.   Teaching Method Utilized:  Visual Auditory Hands on  Handouts given during visit include: none  Barriers to learning/adherence to lifestyle change: none  identified  Demonstrated degree of understanding via:  Teach Back   Monitoring/Evaluation:  Dietary intake, exercise, and body weight in 1 month(s).

## 2022-06-06 DIAGNOSIS — E1121 Type 2 diabetes mellitus with diabetic nephropathy: Secondary | ICD-10-CM | POA: Diagnosis not present

## 2022-06-28 ENCOUNTER — Encounter: Payer: Self-pay | Admitting: Registered"

## 2022-06-28 ENCOUNTER — Encounter: Payer: Medicare HMO | Attending: Family Medicine | Admitting: Registered"

## 2022-06-28 DIAGNOSIS — E1121 Type 2 diabetes mellitus with diabetic nephropathy: Secondary | ICD-10-CM | POA: Diagnosis present

## 2022-06-28 DIAGNOSIS — Z713 Dietary counseling and surveillance: Secondary | ICD-10-CM | POA: Insufficient documentation

## 2022-06-28 DIAGNOSIS — E1165 Type 2 diabetes mellitus with hyperglycemia: Secondary | ICD-10-CM

## 2022-06-28 NOTE — Patient Instructions (Signed)
-   See about moving endocrinologist appt up, if able.   - Aim to have 1/2 dessert instead of the whole item along with vegetables + protein.   - Aim to walk 15-20 min, 2 days/week.

## 2022-06-28 NOTE — Progress Notes (Unsigned)
Medical Nutrition Therapy:  Appt start time: 11:00 end time: 12:09  *Pt states she would like for me to connect with new RD for collaboration of care; signed ROI (01/18/2022).   Assessment: Primary concerns today:  Pt states her recent A1c has increased to 11.5. Lab results from 02/2022 are: Urine microalbumin 5.75H Urine MA/CR Ratio 134.3H Glu 283H Chol 143 Trig 135 HDLD 34 LDL Chol 84 eGFR 126    State she received a call a few weeks to set up an appointment to see endocrinologist in 10/2022. Reports it was initially scheduled for 12/3242 but it conflicted with [employer] taking her to work on that day therefore postponed it to November.   States FBS ranges (243 - 413); continuing to use 50 units of insulin nightly.  010-272  States Sunday her friend brought her a [place] biscuit from [employer] with 1/2 [employer] salad and grilled chicken, with hopes the vegetables would balance it out. States she is going to [employer] with a friend to celebrate her birthday; will likely get a side salad and sandwich and possibly frozen yogurt afterwards.    Pt states she is walking 2 days/week on Tues and Fri for 15-20 minutes. Pt states she had a scare last week with conversation about amputations. States other RD is recommending her to increase physical activity to improve BS numbers. States she also walks around the grocery store.   States she and dad no longer go to [employer] for breakfast and eat breakfast meal at local dinner to improve eating habits. Reports daily eating habits are still the same.   Reports she will be getting blood work done on Mon, 7/3.   Previous appt: States her recent A1c was 11.4% and increased from 11.2%. States she was referred to a dietitian within the [facility] system and has upcoming appt next week, 2/21. States she is unaware of any other abnormal lab values from PCP visit. Pt states she is frustrated with her diabetes journey. States she feels like her A1c  numbers are high because she likes to eat junk food like M&Ms. States she is interested in medication changes to help with decreasing glucose numbers. Pt states she does not want be on dialysis and wants to prolong it as long as possible.   Current medication usage:  Xultophy (combination of degludec and liraglutide) - takes at night  Losartan  Metformin Low dose aspirin  Diagnosed with diabetes at least 10 years ago. Reports next appt with PCP is 9/26. States she does not recall A1c from PCP appt. On days when she eats evening snack of multiple items, FBS is elevated. On days when evening snack is nonexistent, FBS are closer to 130. 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 States she can sense satisfaction when eating.   Pt states she likes butternut squash, cauliflower (faux potatoes), broccoli with cheese or ranch, green beans, peas, beets as vegetable options. Pt loves veggie tots.     Preferred Learning Style:  No preference indicated   Learning Readiness:  Ready Change in progress   MEDICATIONS: See list   DIETARY INTAKE:  Usual eating pattern includes 3 meals and 2 snacks per day.  Everyday foods include yogurt, nuts, berries, veggie tots, fried chicken, green beans, steamed broccoli, snow peas. Avoided foods include some vegetables.    24-hr  recall:  B (8 AM): trail mix (cereal/almonds/grapes) or waffles + almonds + mango pieces  Snk (11 AM): frosted mini wheats + almonds  L (12-1 PM): [employer] - kids meal - hamburger + fries  Snk (2 PM): Combos + mini fig bar + almonds D (6-7 PM): skipped or 1/2 calzone + salad bowl + greek yogurt with cereal/almonds/trail mix/grapes Snk (PM):   Beverages: water (64 oz), coffee (20 oz), sparkling water (33 oz), diet soda (occasionally); 117+ oz  Usual physical activity: walking 15-20 min, 2  days/week  Estimated energy needs: 2000-2200 calories 225-248 g carbohydrates 150-165 g protein 56-61 g fat  Progress Towards Goal(s):  Some progress.   Nutritional Diagnosis:  NB-1.5 Disordered eating pattern As related to balanced meals.  As evidenced by pt reports indulging.    Intervention:  Nutrition education and counseling. Mainly listened and affirmed patients choice to increase walking to 2 times/week. Discussed meeting with an endocrinologist to explore other diabetes-medication options. Pt agreed with goals.   Goals: - Continue walking at least 2 times/week.   Teaching Method Utilized:  Visual Auditory Hands on  Handouts given during visit include: none  Barriers to learning/adherence to lifestyle change: none identified  Demonstrated degree of understanding via:  Teach Back   Monitoring/Evaluation:  Dietary intake, exercise, and body weight in 1 month(s).

## 2022-07-12 DIAGNOSIS — E1121 Type 2 diabetes mellitus with diabetic nephropathy: Secondary | ICD-10-CM | POA: Diagnosis not present

## 2022-07-12 DIAGNOSIS — N181 Chronic kidney disease, stage 1: Secondary | ICD-10-CM | POA: Diagnosis not present

## 2022-07-12 DIAGNOSIS — E7800 Pure hypercholesterolemia, unspecified: Secondary | ICD-10-CM | POA: Diagnosis not present

## 2022-07-13 ENCOUNTER — Ambulatory Visit (INDEPENDENT_AMBULATORY_CARE_PROVIDER_SITE_OTHER): Payer: Medicare HMO | Admitting: Podiatry

## 2022-07-13 ENCOUNTER — Encounter: Payer: Self-pay | Admitting: Podiatry

## 2022-07-13 DIAGNOSIS — G809 Cerebral palsy, unspecified: Secondary | ICD-10-CM

## 2022-07-13 DIAGNOSIS — B351 Tinea unguium: Secondary | ICD-10-CM

## 2022-07-13 DIAGNOSIS — M79609 Pain in unspecified limb: Secondary | ICD-10-CM | POA: Diagnosis not present

## 2022-07-13 DIAGNOSIS — E119 Type 2 diabetes mellitus without complications: Secondary | ICD-10-CM

## 2022-07-13 NOTE — Progress Notes (Signed)
This patient returns to my office for at risk foot care.  This patient requires this care by a professional since this patient will be at risk due to having diabetes.  This patient is unable to cut nails herself since the patient cannot reach her nails.These nails are painful walking and wearing shoes.  This patient presents for at risk foot care today.    General Appearance  Alert, conversant and in no acute stress.  Vascular  Dorsalis pedis and posterior tibial  pulses are weakly  palpable  due to swelling  bilaterally.  Capillary return is within normal limits  bilaterally. Temperature is within normal limits  Bilaterally. Swelling feet/legs  B/L.  Neurologic  Senn-Weinstein monofilament wire test within normal limits  bilaterally. Muscle power within normal limits bilaterally.  Nails Thick disfigured discolored nails with subungual debris  from hallux to fifth toes bilaterally. No evidence of bacterial infection or drainage bilaterally.  Orthopedic  No limitations of motion  feet .  No crepitus or effusions noted.  No bony pathology or digital deformities noted.  Skin  normotropic skin with no porokeratosis noted bilaterally.  No signs of infections or ulcers noted.   Red swollen feet  B/L.  Onychomycosis  Pain in right toes  Pain in left toes  Consent was obtained for treatment procedures.   Mechanical debridement of nails 1-5  bilaterally performed with a nail nipper.  Patient requests no dremel tool usage. Reddish discoloration of top of both feet.  Told patient to watch the redness.   Return office visit 10 weeks                  Told patient to return for periodic foot care and evaluation due to potential at risk complications.   Helane Gunther DPM

## 2022-08-30 ENCOUNTER — Encounter: Payer: Medicare HMO | Attending: Family Medicine | Admitting: Registered"

## 2022-08-30 ENCOUNTER — Encounter: Payer: Self-pay | Admitting: Registered"

## 2022-08-30 DIAGNOSIS — E1165 Type 2 diabetes mellitus with hyperglycemia: Secondary | ICD-10-CM | POA: Diagnosis not present

## 2022-08-30 DIAGNOSIS — Z713 Dietary counseling and surveillance: Secondary | ICD-10-CM | POA: Diagnosis not present

## 2022-08-30 DIAGNOSIS — E7800 Pure hypercholesterolemia, unspecified: Secondary | ICD-10-CM | POA: Diagnosis not present

## 2022-08-30 DIAGNOSIS — E1121 Type 2 diabetes mellitus with diabetic nephropathy: Secondary | ICD-10-CM | POA: Diagnosis not present

## 2022-08-30 DIAGNOSIS — Z6841 Body Mass Index (BMI) 40.0 and over, adult: Secondary | ICD-10-CM | POA: Diagnosis not present

## 2022-08-30 DIAGNOSIS — N181 Chronic kidney disease, stage 1: Secondary | ICD-10-CM | POA: Diagnosis not present

## 2022-08-30 NOTE — Patient Instructions (Addendum)
-   Ask questions when ordering food at restaurants.   - Listening for satisfaction when eating.

## 2022-08-30 NOTE — Progress Notes (Signed)
Medical Nutrition Therapy:  Appt start time: 9:30 end time: 10:34  *Pt states she would like for me to connect with new RD for collaboration of care; signed ROI (01/18/2022).   Assessment: Primary concerns today:   States she is still seeing other RD. States they are working on decreasing BS numbers. States she does not recall goals from previous appt and unsure what they are working on. States she will see RD after our appt today.   States FBS ranges are elevated from previous month (234-430); continuing to use 50 units of insulin nightly.   States she has been ordering 3 pancakes + eggs + bacon/sausage for breakfast with dad at local diner. States dad told her to reduce pancakes to 2 instead of 3. Pt agrees. States she went out to eat with mom yesterday and had taco salad with tortilla chips. States mom ordered a side of fries. Pt ate a few and states she left some on her plate because she knew mom was ready to go. States afterwards, she realized she had enough. States she didn't want mom to have to wait for her because she fears inappropriate comments of her food choices will happen at that point. States she cannot sense hunger but can tell when she has had enough/no longer hunger. States she does not like to waste food. Denies binge eating to the point of being sick in a long time.   States she didn't walk yesterday because she was being lazy and will no longer be walking on Sundays due to bible study beginning again.   Previous appt: Pt states her recent A1c has increased to 11.5% from 11.4%. Additional lab results from 02/2022 are: Urine microalbumin 5.75H Urine MA/CR Ratio 134.3H Glu 283H Chol 143 Trig 135 HDLD 34 LDL Chol 84 eGFR 126  States she received a call a few weeks to set up an appointment to see endocrinologist in 10/2022. Reports appt was initially scheduled for 07/2022 but it conflicted with [employer] taking her to work on that day therefore postponed her appt to November.    States she was referred to a dietitian within the [facility] system and has upcoming appt next week, 2/21. States she is unaware of any other abnormal lab values from PCP visit. Pt states she is frustrated with her diabetes journey. States she feels like her A1c numbers are high because she likes to eat junk food like M&Ms. States she is interested in medication changes to help with decreasing glucose numbers. Pt states she does not want be on dialysis and wants to prolong it as long as possible.   Current medication usage:  Xultophy (combination of degludec and liraglutide) - takes at night  Losartan  Metformin Low dose aspirin  Diagnosed with diabetes at least 10 years ago. Reports next appt with PCP is 9/26. States she does not recall A1c from PCP appt. On days when she eats evening snack of multiple items, FBS is elevated. On days when evening snack is nonexistent, FBS are closer to 130. 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 States she can sense satisfaction when eating.   Pt states she likes butternut squash, cauliflower (faux potatoes), broccoli with cheese or ranch, green beans, peas, beets as vegetable options. Pt loves veggie tots.     Preferred Learning Style:  No preference indicated     Learning Readiness:  Ready Change in progress   MEDICATIONS: See list   DIETARY INTAKE:  Usual eating pattern includes 3 meals and 2 snacks per day.  Everyday foods include yogurt, nuts, berries, veggie tots, fried chicken, green beans, steamed broccoli, snow peas. Avoided foods include some vegetables.    24-hr recall:  B (8 AM): trail mix (cereal/almonds/grapes) or waffles + almonds + mango pieces  Snk (11 AM): frosted mini wheats + almonds  L (12-1 PM): [employer]-taco salad + chips + 1/2 small fries   Snk (2 PM): Combos + mini fig bar + almonds D  (6-7 PM): skipped or 1/2 calzone + salad bowl + greek yogurt with cereal/almonds/trail mix/grapes Snk (PM):   Beverages: water (64 oz), coffee (20 oz), sparkling water (33 oz), diet soda (occasionally); 117+ oz  Usual physical activity: walking 15-20 min, 2 days/week  Estimated energy needs: 2000-2200 calories 225-248 g carbohydrates 150-165 g protein 56-61 g fat  Progress Towards Goal(s):  Some progress.   Nutritional Diagnosis:  NB-1.5 Disordered eating pattern As related to balanced meals.  As evidenced by pt reports indulging.    Intervention:  Nutrition education and counseling. Mainly listened and discussed her role in managing her health. Discussed being mindful and making decisions when ordering food at restaurants.Discussed hunger vs satiety vs fullness. Pt agreed with goals.   Goals: - Ask questions when ordering food at restaurants.  - Listening for satisfaction when eating.   Teaching Method Utilized:  Visual Auditory Hands on  Handouts given during visit include: none  Barriers to learning/adherence to lifestyle change: none identified  Demonstrated degree of understanding via:  Teach Back   Monitoring/Evaluation:  Dietary intake, exercise, and body weight in 1 month(s).  

## 2022-09-05 DIAGNOSIS — E1121 Type 2 diabetes mellitus with diabetic nephropathy: Secondary | ICD-10-CM | POA: Diagnosis not present

## 2022-09-05 DIAGNOSIS — E7800 Pure hypercholesterolemia, unspecified: Secondary | ICD-10-CM | POA: Diagnosis not present

## 2022-09-12 DIAGNOSIS — G809 Cerebral palsy, unspecified: Secondary | ICD-10-CM | POA: Diagnosis not present

## 2022-09-12 DIAGNOSIS — E1121 Type 2 diabetes mellitus with diabetic nephropathy: Secondary | ICD-10-CM | POA: Diagnosis not present

## 2022-09-12 DIAGNOSIS — Z6841 Body Mass Index (BMI) 40.0 and over, adult: Secondary | ICD-10-CM | POA: Diagnosis not present

## 2022-09-21 ENCOUNTER — Ambulatory Visit: Payer: Medicare HMO | Admitting: Podiatry

## 2022-10-04 ENCOUNTER — Encounter: Payer: Medicare HMO | Attending: Internal Medicine | Admitting: Registered"

## 2022-10-04 ENCOUNTER — Encounter: Payer: Self-pay | Admitting: Registered"

## 2022-10-04 DIAGNOSIS — Z713 Dietary counseling and surveillance: Secondary | ICD-10-CM | POA: Diagnosis not present

## 2022-10-04 DIAGNOSIS — E1121 Type 2 diabetes mellitus with diabetic nephropathy: Secondary | ICD-10-CM | POA: Diagnosis not present

## 2022-10-04 DIAGNOSIS — R809 Proteinuria, unspecified: Secondary | ICD-10-CM | POA: Diagnosis not present

## 2022-10-04 DIAGNOSIS — E1165 Type 2 diabetes mellitus with hyperglycemia: Secondary | ICD-10-CM

## 2022-10-04 DIAGNOSIS — Z6841 Body Mass Index (BMI) 40.0 and over, adult: Secondary | ICD-10-CM | POA: Diagnosis not present

## 2022-10-04 DIAGNOSIS — K219 Gastro-esophageal reflux disease without esophagitis: Secondary | ICD-10-CM | POA: Diagnosis not present

## 2022-10-04 DIAGNOSIS — E7800 Pure hypercholesterolemia, unspecified: Secondary | ICD-10-CM | POA: Diagnosis not present

## 2022-10-04 NOTE — Patient Instructions (Signed)
-   Have premier protein with no more than half snack bag for balanced snack option.

## 2022-10-04 NOTE — Progress Notes (Signed)
Medical Nutrition Therapy:  Appt start time: 9:26 end time: 10:21  *Pt states she would like for me to connect with new RD for collaboration of care; signed ROI (01/18/2022).   Assessment: Primary concerns today:   States she has started cutting out trail mix on 10/25 due to another RD recommendations from their previous appt. States she misses the crunch. Reports she is drinking Premier Protein sometimes. States with the changes this has not affected her BS like she thought it would. States she feels like she is a dysfunction. States she tries to make conscious decisions to do what it needed. States she has appt with endocrinologist appt next week and looking forward to it.   Reports recent BS numbers: FBS (271-430). States today she is frustrated about numbers not changing and making changes with hopes to decrease numbers. Trying to consume at least 64 oz of water a day. States some days are better than others.   States she has pastries from work regularly; volunteers at [employer]. States she gets a Arts administrator every Saturday but will drink later in the week.   Previous appt: Pt states her recent A1c has increased to 11.5% from 11.4%. Additional lab results from 02/2022 are: Urine microalbumin 5.75H Urine MA/CR Ratio 134.3H Glu 283H Chol 143 Trig 135 HDLD 34 LDL Chol 84 eGFR 126  States she received a call a few weeks to set up an appointment to see endocrinologist in 10/2022. Reports appt was initially scheduled for 03/3328 but it conflicted with [employer] taking her to work on that day therefore postponed her appt to November.   States she was referred to a dietitian within the [facility] system and has upcoming appt next week, 2/21. States she is unaware of any other abnormal lab values from PCP visit. Pt states she is frustrated with her diabetes journey. States she feels like her A1c numbers are high because she likes to eat junk food like M&Ms. States she is interested in medication  changes to help with decreasing glucose numbers. Pt states she does not want be on dialysis and wants to prolong it as long as possible.   Current medication usage:  Xultophy (combination of degludec and liraglutide) - takes at night  Losartan  Metformin Low dose aspirin  Diagnosed with diabetes at least 10 years ago. Reports next appt with PCP is 9/26. States she does not recall A1c from PCP appt. On days when she eats evening snack of multiple items, FBS is elevated. On days when evening snack is nonexistent, FBS are closer to 130. 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 States she can sense satisfaction when eating.   Pt states she likes butternut squash, cauliflower (faux potatoes), broccoli with cheese or ranch, green beans, peas, beets as vegetable options. Pt loves veggie tots.     Preferred Learning Style:  No preference indicated   Learning Readiness:  Ready Change in progress   MEDICATIONS: See list   DIETARY INTAKE:  Usual eating pattern includes 3 meals and 2 snacks per day.  Everyday foods include yogurt, nuts, berries, veggie tots, fried chicken, green beans, steamed broccoli, snow peas. Avoided foods include some vegetables.    24-hr recall:  B (8 AM): cereal + cottage cheese + a few grapes Snk (11 AM):  L (12-1 PM): [employer]-1 hot  dog all the way + 1/2 order of fries  Snk (2 PM): Combos + mini fig bar + almonds D (6-7 PM): salad  Snk (PM): pastries from [employer]   Beverages: water (64 oz), coffee (20 oz), sparkling water (33 oz), diet soda (occasionally); 117+ oz  Usual physical activity: walking 15-20 min, 2 days/week  Estimated energy needs: 2000-2200 calories 225-248 g carbohydrates 150-165 g protein 56-61 g fat  Progress Towards Goal(s):  Some progress.   Nutritional Diagnosis:  NB-1.5 Disordered  eating pattern As related to balanced meals.  As evidenced by pt reports indulging.    Intervention:  Nutrition education and counseling. Mainly listened and discussed physiological factors at play within type 2 diabetes that are beyond her control. Pt agreed with goals.   Goals: - Have premier protein with no more than half snack bag for balanced snack option.   Teaching Method Utilized:  Visual Auditory Hands on  Handouts given during visit include: none  Barriers to learning/adherence to lifestyle change: none identified  Demonstrated degree of understanding via:  Teach Back   Monitoring/Evaluation:  Dietary intake, exercise, and body weight in 1 month(s).

## 2022-10-05 ENCOUNTER — Encounter: Payer: Self-pay | Admitting: Podiatry

## 2022-10-05 ENCOUNTER — Ambulatory Visit (INDEPENDENT_AMBULATORY_CARE_PROVIDER_SITE_OTHER): Payer: Medicare HMO | Admitting: Podiatry

## 2022-10-05 DIAGNOSIS — B351 Tinea unguium: Secondary | ICD-10-CM

## 2022-10-05 DIAGNOSIS — M79609 Pain in unspecified limb: Secondary | ICD-10-CM

## 2022-10-05 DIAGNOSIS — E119 Type 2 diabetes mellitus without complications: Secondary | ICD-10-CM

## 2022-10-05 DIAGNOSIS — G809 Cerebral palsy, unspecified: Secondary | ICD-10-CM | POA: Diagnosis not present

## 2022-10-05 NOTE — Progress Notes (Signed)
This patient returns to my office for at risk foot care.  This patient requires this care by a professional since this patient will be at risk due to having diabetes.  This patient is unable to cut nails herself since the patient cannot reach her nails.These nails are painful walking and wearing shoes.  This patient presents for at risk foot care today.    General Appearance  Alert, conversant and in no acute stress.  Vascular  Dorsalis pedis and posterior tibial  pulses are weakly  palpable  due to swelling  bilaterally.  Capillary return is within normal limits  bilaterally. Temperature is within normal limits  Bilaterally. Swelling feet/legs  B/L.  Neurologic  Senn-Weinstein monofilament wire test within normal limits  bilaterally. Muscle power within normal limits bilaterally.  Nails Thick disfigured discolored nails with subungual debris  from hallux to fifth toes bilaterally. No evidence of bacterial infection or drainage bilaterally.  Orthopedic  No limitations of motion  feet .  No crepitus or effusions noted.  No bony pathology or digital deformities noted.  Skin  normotropic skin with no porokeratosis noted bilaterally.  No signs of infections or ulcers noted.   Red swollen feet  B/L.  Onychomycosis  Pain in right toes  Pain in left toes  Consent was obtained for treatment procedures.   Mechanical debridement of nails 1-5  bilaterally performed with a nail nipper.  Patient requests no dremel tool usage. Reddish discoloration of top of both feet.  Told patient to watch the redness.   Return office visit 9 weeks                  Told patient to return for periodic foot care and evaluation due to potential at risk complications.   Gardiner Barefoot DPM

## 2022-10-11 DIAGNOSIS — E1165 Type 2 diabetes mellitus with hyperglycemia: Secondary | ICD-10-CM | POA: Diagnosis not present

## 2022-10-11 DIAGNOSIS — R809 Proteinuria, unspecified: Secondary | ICD-10-CM | POA: Diagnosis not present

## 2022-10-11 DIAGNOSIS — G8191 Hemiplegia, unspecified affecting right dominant side: Secondary | ICD-10-CM | POA: Diagnosis not present

## 2022-10-11 DIAGNOSIS — E1121 Type 2 diabetes mellitus with diabetic nephropathy: Secondary | ICD-10-CM | POA: Diagnosis not present

## 2022-10-11 DIAGNOSIS — G809 Cerebral palsy, unspecified: Secondary | ICD-10-CM | POA: Diagnosis not present

## 2022-10-11 DIAGNOSIS — E7800 Pure hypercholesterolemia, unspecified: Secondary | ICD-10-CM | POA: Diagnosis not present

## 2022-10-17 ENCOUNTER — Other Ambulatory Visit: Payer: Self-pay | Admitting: Podiatry

## 2022-11-01 ENCOUNTER — Encounter: Payer: Medicare HMO | Attending: Internal Medicine | Admitting: Registered"

## 2022-11-01 DIAGNOSIS — E7800 Pure hypercholesterolemia, unspecified: Secondary | ICD-10-CM | POA: Diagnosis not present

## 2022-11-01 DIAGNOSIS — Z6841 Body Mass Index (BMI) 40.0 and over, adult: Secondary | ICD-10-CM | POA: Diagnosis not present

## 2022-11-01 DIAGNOSIS — K219 Gastro-esophageal reflux disease without esophagitis: Secondary | ICD-10-CM | POA: Diagnosis not present

## 2022-11-01 DIAGNOSIS — E119 Type 2 diabetes mellitus without complications: Secondary | ICD-10-CM | POA: Insufficient documentation

## 2022-11-01 DIAGNOSIS — Z713 Dietary counseling and surveillance: Secondary | ICD-10-CM | POA: Insufficient documentation

## 2022-11-01 DIAGNOSIS — E1165 Type 2 diabetes mellitus with hyperglycemia: Secondary | ICD-10-CM | POA: Diagnosis not present

## 2022-11-01 NOTE — Progress Notes (Unsigned)
Medical Nutrition Therapy:  Appt start time: 2:00 end time: 2:54  *Pt states she would like for me to connect with new RD for collaboration of care; signed ROI (01/18/2022).   Assessment: Primary concerns today:   States she has had initial visit with endocrinologist since previous visit. States she has had medication changes, now taking [place] and Ozempic. States she follows-up in 2 months, 12/2022. Reports since changing medications, BS have decreased. Reports recent BS numbers: FBS (354-656).   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 States she is decreasing snack bag portion to half a bag.      has started cutting out trail mix on 10/25 due to another RD recommendations from their previous appt. States she misses the crunch. Reports she is drinking Premier Protein sometimes. States with the changes this has not affected her BS like she thought it would. States she feels like she is a dysfunction. States she tries to make conscious decisions to do what it needed. States she has appt with endocrinologist appt next week and looking forward to it.   States today she is frustrated about numbers not changing and making changes with hopes to decrease numbers. Trying to consume at least 64 oz of water a day. States some days are better than others.   States she has pastries from work regularly; volunteers at [employer]. States she gets a Arts administrator every Saturday but will drink later in the week.   Previous appt: Pt states her recent A1c has increased to 11.5% from 11.4%. Additional lab results from 02/2022 are: Urine microalbumin 5.75H Urine MA/CR Ratio 134.3H Glu 283H Chol 143 Trig 135 HDLD 34 LDL Chol 84 eGFR 126  States she received a call a few weeks to set up an appointment to see endocrinologist in 10/2022. Reports appt was initially scheduled for 07/1274 but it conflicted with [employer] taking her to work on that day therefore postponed her appt to November.   States she  was referred to a dietitian within the [facility] system and has upcoming appt next week, 2/21. States she is unaware of any other abnormal lab values from PCP visit. Pt states she is frustrated with her diabetes journey. States she feels like her A1c numbers are high because she likes to eat junk food like M&Ms. States she is interested in medication changes to help with decreasing glucose numbers. Pt states she does not want be on dialysis and wants to prolong it as long as possible.   Current medication usage:  Xultophy (combination of degludec and liraglutide) - takes at night  Losartan  Metformin Low dose aspirin  Diagnosed with diabetes at least 10 years ago. Reports next appt with PCP is 9/26. States she does not recall A1c from PCP appt. On days when she eats evening snack of multiple items, FBS is elevated. On days when evening snack is nonexistent, FBS are closer to 130. 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 States she can sense satisfaction when eating.   Pt states she likes butternut squash, cauliflower (faux potatoes), broccoli with cheese or ranch, green beans, peas, beets as vegetable options. Pt loves veggie tots.     Preferred Learning Style:  No preference indicated   Learning Readiness:  Ready Change in progress   MEDICATIONS: See list   DIETARY INTAKE:  Usual eating pattern includes 3 meals and 2 snacks per day.  Everyday foods include yogurt, nuts, berries, veggie tots, fried chicken, green beans, steamed broccoli, snow peas. Avoided foods include some vegetables.    24-hr recall:  B (8 AM): cereal + trail mix + almonds + a few grapes Snk (11 AM):  L (12-1 PM): [employer] - 1/2 cheeseburger + pasta salad   Snk (2 PM): [employer] + trail mix + almonds D (6-7 PM): salad + PBJ + greek yogurt + cereal + trail mix Snk (PM):     Beverages: water (64 oz), coffee (20 oz), sparkling water (33 oz), diet soda (occasionally); 117+ oz  Usual physical activity: walking 15-20 min, 2 days/week  Estimated energy needs: 2000-2200 calories 225-248 g carbohydrates 150-165 g protein 56-61 g fat  Progress Towards Goal(s):  Some progress.   Nutritional Diagnosis:  NB-1.5 Disordered eating pattern As related to balanced meals.  As evidenced by pt reports indulging.    Intervention:  Nutrition education and counseling. Mainly listened and discussed physiological factors at play within type 2 diabetes that are beyond her control. Pt agreed with goals.   Goals:   Teaching Method Utilized:  Visual Auditory Hands on  Handouts given during visit include: none  Barriers to learning/adherence to lifestyle change: none identified  Demonstrated degree of understanding via:  Teach Back   Monitoring/Evaluation:  Dietary intake, exercise, and body weight in 1 month(s).

## 2022-11-19 ENCOUNTER — Other Ambulatory Visit: Payer: Self-pay | Admitting: Podiatry

## 2022-11-21 DIAGNOSIS — Z7984 Long term (current) use of oral hypoglycemic drugs: Secondary | ICD-10-CM | POA: Diagnosis not present

## 2022-11-21 DIAGNOSIS — E119 Type 2 diabetes mellitus without complications: Secondary | ICD-10-CM | POA: Diagnosis not present

## 2022-11-21 DIAGNOSIS — Z794 Long term (current) use of insulin: Secondary | ICD-10-CM | POA: Diagnosis not present

## 2022-11-21 DIAGNOSIS — H52203 Unspecified astigmatism, bilateral: Secondary | ICD-10-CM | POA: Diagnosis not present

## 2022-11-21 DIAGNOSIS — H5213 Myopia, bilateral: Secondary | ICD-10-CM | POA: Diagnosis not present

## 2022-11-21 DIAGNOSIS — Z7985 Long-term (current) use of injectable non-insulin antidiabetic drugs: Secondary | ICD-10-CM | POA: Diagnosis not present

## 2022-12-06 ENCOUNTER — Encounter: Payer: Medicare HMO | Attending: Family Medicine | Admitting: Registered"

## 2022-12-06 DIAGNOSIS — Z713 Dietary counseling and surveillance: Secondary | ICD-10-CM | POA: Insufficient documentation

## 2022-12-06 DIAGNOSIS — E119 Type 2 diabetes mellitus without complications: Secondary | ICD-10-CM | POA: Diagnosis not present

## 2022-12-06 DIAGNOSIS — E1165 Type 2 diabetes mellitus with hyperglycemia: Secondary | ICD-10-CM | POA: Diagnosis not present

## 2022-12-06 DIAGNOSIS — Z6841 Body Mass Index (BMI) 40.0 and over, adult: Secondary | ICD-10-CM | POA: Diagnosis not present

## 2022-12-06 NOTE — Progress Notes (Signed)
Medical Nutrition Therapy:  Appt start time: 9:40 end time: 10:35  *Pt states she would like for me to connect with new RD for collaboration of care; signed ROI (01/18/2022).   Assessment: Primary concerns today:   Pt arrives stating she had to change upcoming endocrinologist appt to March due to schedule conflict for this afternoon. Reports recent BS numbers: FBS ([phone]). Reports when at parents home FBS numbers were 107-185. States while at parent's house she didn't eat waffles with breakfast or snack as much because she forgot to pack her snack bag. States she is seeing her other RD today as well.   States she thought about walking yesterday but didn't have time; wants to try again today.   Previous visit: States she has had medication changes; now taking [place] and Ozempic. States she follows-up in 2 months, 12/2022. Reports since changing medications, FBS readings have decreased. 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 States she is decreasing snack bag portion to half a bag instead of the whole bag as each snack option.   Pt states her recent A1c has increased to 11.5% from 11.4%. Additional lab results from 02/2022 are: Urine microalbumin 5.75H Urine MA/CR Ratio 134.3H Glu 283H Chol 143 Trig 135 HDLD 34 LDL Chol 84 eGFR 126  States she was referred to a dietitian within the [facility] system and has upcoming appt next week, 2/21.  Current medication usage:  Xultophy (combination of degludec and liraglutide) - takes at night  Losartan  Metformin Low dose aspirin  Diagnosed with diabetes at least 10 years ago. Reports next appt with PCP is 9/26. States she does not recall A1c from PCP appt. On days when she eats evening snack of multiple items, FBS is elevated. On days when evening snack is nonexistent, FBS are closer to 130. 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 States it damaged the portion of her  brain that controls appetite. States she can sense satisfaction when eating.   Pt states she likes butternut squash, cauliflower (faux potatoes), broccoli with cheese or ranch, green beans, peas, beets as vegetable options. Pt loves veggie tots.     Preferred Learning Style:  No preference indicated   Learning Readiness:  Ready Change in progress   MEDICATIONS: See list   DIETARY INTAKE:  Usual eating pattern includes 3 meals and 2 snacks per day.  Everyday foods include yogurt, nuts, berries, veggie tots, fried chicken, green beans, steamed broccoli, snow peas. Avoided foods include some vegetables.    24-hr recall:  B (8 AM): cereal + trail mix + almonds + a few grapes Snk (11 AM):  L (12-1 PM): [employer] - 1/2 cheeseburger + pasta salad   Snk (2 PM): [employer] + trail mix + almonds D (6-7 PM): salad + PBJ + greek yogurt + cereal + trail mix Snk (PM):    Beverages: water (64 oz), coffee (20 oz), sparkling water (33 oz), diet soda (occasionally); 117+ oz  Usual physical activity: walking 15-20 min, 2 days/week  Estimated energy needs: 2000-2200 calories 225-248 g carbohydrates 150-165 g protein 56-61 g fat  Progress Towards Goal(s):  Some progress.   Nutritional Diagnosis:  NB-1.5 Disordered eating pattern As related to balanced meals.  As evidenced by pt reports indulging.    Intervention:  Nutrition education and counseling. Mainly listened and discussed correlation between being at parent's home and BS numbers decreasing. Discussed  ways to make the adjustments at her home. Pt agreed with goals. Goals: - Aim to snack only when working and on Sundays when there's longer than 5 hours between meals.      Teaching Method Utilized:  Visual Auditory Hands on  Handouts given during visit include: none  Barriers to learning/adherence to lifestyle change: none identified  Demonstrated degree of understanding via:  Teach Back   Monitoring/Evaluation:  Dietary  intake, exercise, and body weight in 1 month(s).

## 2022-12-06 NOTE — Patient Instructions (Signed)
-   Aim to snack only when working and on Sundays when there's longer than 5 hours between meals.

## 2022-12-21 ENCOUNTER — Ambulatory Visit (INDEPENDENT_AMBULATORY_CARE_PROVIDER_SITE_OTHER): Payer: Medicare HMO | Admitting: Podiatry

## 2022-12-21 ENCOUNTER — Encounter: Payer: Self-pay | Admitting: Podiatry

## 2022-12-21 DIAGNOSIS — M79609 Pain in unspecified limb: Secondary | ICD-10-CM | POA: Diagnosis not present

## 2022-12-21 DIAGNOSIS — B351 Tinea unguium: Secondary | ICD-10-CM | POA: Diagnosis not present

## 2022-12-21 DIAGNOSIS — E119 Type 2 diabetes mellitus without complications: Secondary | ICD-10-CM

## 2022-12-21 DIAGNOSIS — G809 Cerebral palsy, unspecified: Secondary | ICD-10-CM | POA: Diagnosis not present

## 2022-12-21 NOTE — Progress Notes (Signed)
This patient returns to my office for at risk foot care.  This patient requires this care by a professional since this patient will be at risk due to having diabetes.  This patient is unable to cut nails herself since the patient cannot reach her nails.These nails are painful walking and wearing shoes.  This patient presents for at risk foot care today.    General Appearance  Alert, conversant and in no acute stress.  Vascular  Dorsalis pedis and posterior tibial  pulses are weakly  palpable  due to swelling  bilaterally.  Capillary return is within normal limits  bilaterally. Temperature is within normal limits  Bilaterally. Swelling feet/legs  B/L.  Neurologic  Senn-Weinstein monofilament wire test within normal limits  bilaterally. Muscle power within normal limits bilaterally.  Nails Thick disfigured discolored nails with subungual debris  from hallux to fifth toes bilaterally. No evidence of bacterial infection or drainage bilaterally.  Orthopedic  No limitations of motion  feet .  No crepitus or effusions noted.  No bony pathology or digital deformities noted.  Skin  normotropic skin with no porokeratosis noted bilaterally.  No signs of infections or ulcers noted.   Red swollen feet  B/L.  Onychomycosis  Pain in right toes  Pain in left toes  Consent was obtained for treatment procedures.   Mechanical debridement of nails 1-5  bilaterally performed with a nail nipper.  Patient requests no dremel tool usage.   Return office visit 9 weeks                  Told patient to return for periodic foot care and evaluation due to potential at risk complications.   Gardiner Barefoot DPM

## 2023-01-17 ENCOUNTER — Encounter: Payer: Medicare HMO | Attending: Family Medicine | Admitting: Registered"

## 2023-01-17 ENCOUNTER — Encounter: Payer: Self-pay | Admitting: Registered"

## 2023-01-17 DIAGNOSIS — E1165 Type 2 diabetes mellitus with hyperglycemia: Secondary | ICD-10-CM | POA: Diagnosis not present

## 2023-01-17 DIAGNOSIS — Z713 Dietary counseling and surveillance: Secondary | ICD-10-CM | POA: Insufficient documentation

## 2023-01-17 DIAGNOSIS — E119 Type 2 diabetes mellitus without complications: Secondary | ICD-10-CM | POA: Insufficient documentation

## 2023-01-17 DIAGNOSIS — Z6841 Body Mass Index (BMI) 40.0 and over, adult: Secondary | ICD-10-CM | POA: Diagnosis not present

## 2023-01-17 NOTE — Progress Notes (Signed)
Medical Nutrition Therapy:  Appt start time: 2:02   end time: 3:00  *Pt states she would like for me to connect with new RD for collaboration of care; signed ROI (01/18/2022).   Assessment: Primary concerns today:   Pt arrives stating she met with other RD prior to this appointment today. Reports she was recommended to stop eating chocolate wafers within her trail mix. States she will finish her current bag and no longer include them in her mix. Pt is tearful due to seeing recent weight of 270 lbs at previous appt. States she has gained weight and feels like her weight is going in the wrong direction. States she is no longer having creamer in coffee and replacing with premier protein. Currently taking 0.5 mg Ozempic. Recent FBS slightly lower (212-416). Reports she will see endocrinologist the first week of March.   Previous visit: Reports when at parents home FBS numbers were 107-185. States while at parent's house she didn't eat waffles with breakfast or snack as much because she forgot to pack her snack bag. States she has had medication changes; now taking [place] and Ozempic. States she follows-up in 2 months, 12/2022. Reports since changing medications, FBS readings have decreased. 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 States she is decreasing snack bag portion to half a bag instead of the whole bag as each snack option.   Pt states her recent A1c has increased to 11.5% from 11.4%. Additional lab results from 02/2022 are: Urine microalbumin 5.75H Urine MA/CR Ratio 134.3H Glu 283H Chol 143 Trig 135 HDLD 34 LDL Chol 84 eGFR 126  States she was referred to a dietitian within the [facility] system and has upcoming appt next week, 2/21.  Current medication usage:  Xultophy (combination of degludec and liraglutide) - takes at night  Losartan  Metformin Low dose aspirin  Diagnosed with diabetes at least 10 years ago. Reports next appt with PCP is 9/26. States she does not recall A1c from  PCP appt. On days when she eats evening snack of multiple items, FBS is elevated. On days when evening snack is nonexistent, FBS are closer to 130. 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 States she can sense satisfaction when eating.   Pt states she likes butternut squash, cauliflower (faux potatoes), broccoli with cheese or ranch, green beans, peas, beets as vegetable options. Pt loves veggie tots.     Preferred Learning Style:  No preference indicated   Learning Readiness:  Ready Change in progress   MEDICATIONS: See list   DIETARY INTAKE:  Usual eating pattern includes 3 meals and 2 snacks per day.  Everyday foods include yogurt, nuts, berries, veggie tots, fried chicken, green beans, steamed broccoli, snow peas. Avoided foods include some vegetables.    24-hr recall:  B (8 AM): cereal + trail mix + almonds + a few grapes Snk (11 AM):  L (12-1 PM): [employer] - sandwich (+ salad [employer] - 1/2 cheeseburger + pasta salad   Snk (2 PM): [employer] + trail mix + almonds D (6-7 PM): salad + PBJ + greek yogurt + cereal + trail mix Snk (PM):    Beverages: water (64 oz), coffee (20 oz), sparkling water (33 oz), diet soda (occasionally); 117+ oz  Usual physical activity: none reported  Estimated energy needs: 2000-2200 calories 225-248 g carbohydrates  150-165 g protein 56-61 g fat  Progress Towards Goal(s):  Some progress.   Nutritional Diagnosis:  NB-1.5 Disordered eating pattern As related to balanced meals.  As evidenced by pt reports indulging.    Intervention:  Nutrition education and counseling. Mainly listened and discussed realistic expectations related to weight changes. Discussed FBS numbers and ways to help improve health. Pt wants to incorporate more physical activity. Discussed ways to do this in her home. Pt agreed  with goals. Goals: - Find "gentle exercise" videos on [email or web address]/. Complete at least 10 min of 1 video a week.    Teaching Method Utilized:  Visual Auditory Hands on  Handouts given during visit include: none  Barriers to learning/adherence to lifestyle change: none identified  Demonstrated degree of understanding via:  Teach Back   Monitoring/Evaluation:  Dietary intake, exercise, and body weight in 1 month(s).

## 2023-01-17 NOTE — Patient Instructions (Signed)
-   Find "gentle exercise" videos on [email or web address] Complete at least 10 min of 1 video a week.

## 2023-02-07 DIAGNOSIS — R809 Proteinuria, unspecified: Secondary | ICD-10-CM | POA: Diagnosis not present

## 2023-02-07 DIAGNOSIS — E559 Vitamin D deficiency, unspecified: Secondary | ICD-10-CM | POA: Diagnosis not present

## 2023-02-07 DIAGNOSIS — G809 Cerebral palsy, unspecified: Secondary | ICD-10-CM | POA: Diagnosis not present

## 2023-02-07 DIAGNOSIS — G8191 Hemiplegia, unspecified affecting right dominant side: Secondary | ICD-10-CM | POA: Diagnosis not present

## 2023-02-07 DIAGNOSIS — E7800 Pure hypercholesterolemia, unspecified: Secondary | ICD-10-CM | POA: Diagnosis not present

## 2023-02-07 DIAGNOSIS — E1165 Type 2 diabetes mellitus with hyperglycemia: Secondary | ICD-10-CM | POA: Diagnosis not present

## 2023-02-14 ENCOUNTER — Encounter: Payer: Medicare HMO | Attending: Internal Medicine | Admitting: Registered"

## 2023-02-14 DIAGNOSIS — Z713 Dietary counseling and surveillance: Secondary | ICD-10-CM | POA: Insufficient documentation

## 2023-02-14 DIAGNOSIS — E119 Type 2 diabetes mellitus without complications: Secondary | ICD-10-CM | POA: Diagnosis not present

## 2023-02-14 DIAGNOSIS — E1165 Type 2 diabetes mellitus with hyperglycemia: Secondary | ICD-10-CM | POA: Diagnosis not present

## 2023-02-14 DIAGNOSIS — E559 Vitamin D deficiency, unspecified: Secondary | ICD-10-CM | POA: Diagnosis not present

## 2023-02-14 DIAGNOSIS — Z6841 Body Mass Index (BMI) 40.0 and over, adult: Secondary | ICD-10-CM | POA: Diagnosis not present

## 2023-02-14 DIAGNOSIS — E7800 Pure hypercholesterolemia, unspecified: Secondary | ICD-10-CM | POA: Diagnosis not present

## 2023-02-14 NOTE — Patient Instructions (Signed)
-   Attend gym 2x/week for 30 min cardio each time.   - Keep up the great work!

## 2023-02-14 NOTE — Progress Notes (Signed)
Medical Nutrition Therapy:  Appt start time: 1:57  Appt end time: 2:55  *Pt states she would like for me to connect with new RD for collaboration of care; signed ROI (01/18/2022).   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 States she has been twice since previous visit. States she plans to go 2x/week, 30 min each to use seated elliptical. Reports she feels great that she has rejoined and looking forward to attending again.   States she saw Anda Kraft, RD this morning and has lost 2 lbs.   States she had recent endocrinologist visit and Ozempic dosage increased when she runs out of current dosage (0.5 mg). Reports she is also taking Vitamin D once/week. Will see endocrinologist again on 6/11.   States recent FBS range from (180-427). Pt tracks daily FBS in notebook and numbers are higher on the weekends. States some weekends she has an event with friends and food is served. States most recently they had pizza and she will typically  take leftovers home.   Previous visit: Reports when at parents home FBS numbers were 107-185. States while at parent's house she didn't eat waffles with breakfast or snack as much because she forgot to pack her snack bag. States she has had medication changes; now taking [place] and Ozempic. States she follows-up in 2 months, 12/2022. Reports since changing medications, FBS readings have decreased. 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 States she is decreasing snack bag portion to half a bag instead of the whole bag as each snack option.   Pt states her recent A1c has increased to 11.5% from 11.4%. Additional lab results from 02/2022 are: Urine microalbumin 5.75H Urine MA/CR Ratio 134.3H Glu 283H Chol 143 Trig 135 HDLD 34 LDL Chol 84 eGFR 126  States she was referred to a dietitian within the [facility] system and has upcoming appt next week, 2/21.  Current medication usage:  Xultophy (combination of degludec  and liraglutide) - takes at night  Losartan  Metformin Low dose aspirin  Diagnosed with diabetes at least 10 years ago. Reports next appt with PCP is 9/26. States she does not recall A1c from PCP appt. On days when she eats evening snack of multiple items, FBS is elevated. On days when evening snack is nonexistent, FBS are closer to 130. 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 States she can sense satisfaction when eating.   Pt states she likes butternut squash, cauliflower (faux potatoes), broccoli with cheese or ranch, green beans, peas, beets as vegetable options. Pt loves veggie tots.     Preferred Learning Style:  No preference indicated   Learning Readiness:  Ready Change in progress   MEDICATIONS: See list   DIETARY INTAKE:  Usual eating pattern includes 3 meals and 2 snacks per day.  Everyday foods include yogurt, nuts, berries, veggie tots, fried chicken, green beans, steamed broccoli, snow peas. Avoided foods include some vegetables.    24-hr recall:  B (8 AM): cereal  Snk (11 AM):  L (12-1 PM): skipped due to getting root canal Snk (2 PM): D (6-7 PM): 1/2 calzone + greek yogurt Snk (PM):    Beverages: water (64 oz), coffee (20 oz), sparkling water (33 oz), diet soda (occasionally); 117+ oz  Usual physical activity: none reported  Estimated  energy needs: 2000-2200 calories 225-248 g carbohydrates 150-165 g protein 56-61 g fat  Progress Towards Goal(s):  Some progress.   Nutritional Diagnosis:  NB-1.5 Disordered eating pattern As related to balanced meals.  As evidenced by pt reports indulging.    Intervention:  Nutrition education and counseling. Encouraged with increasing movement and joining local gym. Educated on medication dosages, timing, role and differences.  Discussed FBS numbers and ways to balance meals  when having leftover pizza or party items. Pt agreed with goals. Goals: - Attend gym 2x/week for 30 min cardio each time.  - Keep up the great work!   Teaching Method Utilized:  Visual Auditory Hands on  Handouts given during visit include: none  Barriers to learning/adherence to lifestyle change: none identified  Demonstrated degree of understanding via:  Teach Back   Monitoring/Evaluation:  Dietary intake, exercise, and body weight in 1 month(s).

## 2023-03-08 ENCOUNTER — Ambulatory Visit: Payer: Medicare HMO | Admitting: Podiatry

## 2023-03-15 ENCOUNTER — Encounter: Payer: Self-pay | Admitting: Podiatry

## 2023-03-15 ENCOUNTER — Ambulatory Visit (INDEPENDENT_AMBULATORY_CARE_PROVIDER_SITE_OTHER): Payer: Medicare HMO | Admitting: Podiatry

## 2023-03-15 DIAGNOSIS — E119 Type 2 diabetes mellitus without complications: Secondary | ICD-10-CM

## 2023-03-15 DIAGNOSIS — M79609 Pain in unspecified limb: Secondary | ICD-10-CM | POA: Diagnosis not present

## 2023-03-15 DIAGNOSIS — B351 Tinea unguium: Secondary | ICD-10-CM

## 2023-03-15 NOTE — Progress Notes (Signed)
This patient returns to my office for at risk foot care.  This patient requires this care by a professional since this patient will be at risk due to having diabetes.  This patient is unable to cut nails herself since the patient cannot reach her nails.These nails are painful walking and wearing shoes.  This patient presents for at risk foot care today.    General Appearance  Alert, conversant and in no acute stress.  Vascular  Dorsalis pedis and posterior tibial  pulses are weakly  palpable  due to swelling  bilaterally.  Capillary return is within normal limits  bilaterally. Temperature is within normal limits  Bilaterally. Swelling feet/legs  B/L.  Neurologic  Senn-Weinstein monofilament wire test within normal limits  bilaterally. Muscle power within normal limits bilaterally.  Nails Thick disfigured discolored nails with subungual debris  from hallux to fifth toes bilaterally. No evidence of bacterial infection or drainage bilaterally.  Orthopedic  No limitations of motion  feet .  No crepitus or effusions noted.  No bony pathology or digital deformities noted.  Skin  normotropic skin with no porokeratosis noted bilaterally.  No signs of infections or ulcers noted.   Red swollen feet  B/L.  Onychomycosis  Pain in right toes  Pain in left toes  Consent was obtained for treatment procedures.   Mechanical debridement of nails 1-5  bilaterally performed with a nail nipper.  Patient requests no dremel tool usage.   Return office visit 9 weeks                  Told patient to return for periodic foot care and evaluation due to potential at risk complications.   Denis Koppel DPM  

## 2023-03-28 ENCOUNTER — Encounter: Payer: Self-pay | Admitting: Registered"

## 2023-03-28 ENCOUNTER — Encounter: Payer: Medicare HMO | Attending: Family Medicine | Admitting: Registered"

## 2023-03-28 DIAGNOSIS — E119 Type 2 diabetes mellitus without complications: Secondary | ICD-10-CM | POA: Diagnosis not present

## 2023-03-28 DIAGNOSIS — Z713 Dietary counseling and surveillance: Secondary | ICD-10-CM | POA: Insufficient documentation

## 2023-03-28 DIAGNOSIS — E1165 Type 2 diabetes mellitus with hyperglycemia: Secondary | ICD-10-CM | POA: Diagnosis not present

## 2023-03-28 DIAGNOSIS — K219 Gastro-esophageal reflux disease without esophagitis: Secondary | ICD-10-CM | POA: Diagnosis not present

## 2023-03-28 DIAGNOSIS — E7800 Pure hypercholesterolemia, unspecified: Secondary | ICD-10-CM | POA: Diagnosis not present

## 2023-03-28 DIAGNOSIS — Z6837 Body mass index (BMI) 37.0-37.9, adult: Secondary | ICD-10-CM | POA: Diagnosis not present

## 2023-03-28 NOTE — Progress Notes (Signed)
Medical Nutrition Therapy:  Appt start time: 10:35 Appt end time: 11:28  *Pt states she would like for me to connect with new RD for collaboration of care; signed ROI (01/18/2022).   Assessment: Primary concerns today:   States she worked out on Friday at the gym. States recent FBS range from (160-342). States she is currently taking 1 mg of Ozempic, administers Sunday night and Vitamin D once/week. States she has dental appts on Mondays and had to decrease exercise days to once/week due to schedule conflict.   Previous visit: Will see endocrinologist again on 6/11. Reports when at parents home FBS numbers were 107-185. States while at parent's house she didn't eat waffles with breakfast or snack as much because she forgot to pack her snack bag. States she has had medication changes; now taking [place] and Ozempic. States she follows-up in 2 months, 12/2022. Reports since changing medications, FBS readings have decreased. 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 States she is decreasing snack bag portion to half a bag instead of the whole bag as each snack option.   Pt states her recent A1c has increased to 11.5% from 11.4%. Additional lab results from 02/2022 are: Urine microalbumin 5.75H Urine MA/CR Ratio 134.3H Glu 283H Chol 143 Trig 135 HDLD 34 LDL Chol 84 eGFR 126  States she was referred to a dietitian within the [facility] system and has upcoming appt next week, 2/21.  Current medication usage:  Xultophy (combination of degludec and liraglutide) - takes at night  Losartan  Metformin Low dose aspirin  Diagnosed with diabetes at least 10 years ago. Reports next appt with PCP is 9/26. States she does not recall A1c from PCP appt. On days when she eats evening snack of multiple items, FBS is elevated. On days when evening snack is nonexistent, FBS are closer to 130. 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 States she can sense satisfaction when eating.   Pt states she likes butternut squash, cauliflower (faux potatoes), broccoli with cheese or ranch, green beans, peas, beets as vegetable options. Pt loves veggie tots.     Preferred Learning Style:  No preference indicated   Learning Readiness:  Ready Change in progress   MEDICATIONS: See list   DIETARY INTAKE:  Usual eating pattern includes 3 meals and 2 snacks per day.  Everyday foods include yogurt, nuts, berries, veggie tots, fried chicken, green beans, steamed broccoli, snow peas. Avoided foods include some vegetables.    24-hr recall:  B (8 AM): cereal + grapes + cottage cheese + cookies Snk (11 AM):  L (12-1 PM): [employer] - 1 hot dog (with chili, mustard, ketchup, slaw) + french fries + diet Coke Snk (2 PM): D (6-7 PM): salad bowl + PBJ + 9 pcs combos + greek yogurt mixed with Grain berry cereal + grapes + cookies  Snk (PM):    Beverages: water (64 oz), coffee (20 oz), sparkling water (33 oz), diet soda (occasionally); 117+ oz  Usual physical activity: seated elliptical 60 min, once/week at gym, walking (sometimes)  Estimated energy needs: 2000-2200 calories 225-248 g carbohydrates 150-165 g protein 56-61 g fat  Progress Towards Goal(s):  Some progress.   Nutritional Diagnosis:  NB-1.5 Disordered eating pattern As related to balanced meals.  As evidenced by pt reports indulging.    Intervention:  Nutrition  education and counseling. Encouraged with increasing movement and joining local gym. Discussed diabetes-friendly dessert/sweet options. Pt agreed with goals. Goals: - Aim to have lunch when getting home from appt today.  - Keep up great work with movement and drinking water.  - Consider making diabetes-friendly cookies instead of purchasing cookies at store.   Teaching Method Utilized:  Visual Auditory Hands on  Handouts given during  visit include: none  Barriers to learning/adherence to lifestyle change: none identified  Demonstrated degree of understanding via:  Teach Back   Monitoring/Evaluation:  Dietary intake, exercise, and body weight in 1 month(s).

## 2023-03-28 NOTE — Patient Instructions (Addendum)
-   Aim to have lunch when getting home from appt today.   - Keep up great work with movement and drinking water.   - Consider making diabetes-friendly cookies instead of purchasing cookies at store.

## 2023-04-03 DIAGNOSIS — E7800 Pure hypercholesterolemia, unspecified: Secondary | ICD-10-CM | POA: Diagnosis not present

## 2023-04-03 DIAGNOSIS — E1121 Type 2 diabetes mellitus with diabetic nephropathy: Secondary | ICD-10-CM | POA: Diagnosis not present

## 2023-04-03 DIAGNOSIS — D8481 Immunodeficiency due to conditions classified elsewhere: Secondary | ICD-10-CM | POA: Diagnosis not present

## 2023-04-03 DIAGNOSIS — G8191 Hemiplegia, unspecified affecting right dominant side: Secondary | ICD-10-CM | POA: Diagnosis not present

## 2023-04-03 DIAGNOSIS — I878 Other specified disorders of veins: Secondary | ICD-10-CM | POA: Diagnosis not present

## 2023-04-03 DIAGNOSIS — B351 Tinea unguium: Secondary | ICD-10-CM | POA: Diagnosis not present

## 2023-04-03 DIAGNOSIS — E559 Vitamin D deficiency, unspecified: Secondary | ICD-10-CM | POA: Diagnosis not present

## 2023-04-03 DIAGNOSIS — K219 Gastro-esophageal reflux disease without esophagitis: Secondary | ICD-10-CM | POA: Diagnosis not present

## 2023-04-03 DIAGNOSIS — R809 Proteinuria, unspecified: Secondary | ICD-10-CM | POA: Diagnosis not present

## 2023-04-03 DIAGNOSIS — G809 Cerebral palsy, unspecified: Secondary | ICD-10-CM | POA: Diagnosis not present

## 2023-04-03 DIAGNOSIS — Z0000 Encounter for general adult medical examination without abnormal findings: Secondary | ICD-10-CM | POA: Diagnosis not present

## 2023-04-18 ENCOUNTER — Encounter: Payer: Medicare HMO | Attending: Family Medicine | Admitting: Registered"

## 2023-04-18 ENCOUNTER — Encounter: Payer: Self-pay | Admitting: Registered"

## 2023-04-18 DIAGNOSIS — Z713 Dietary counseling and surveillance: Secondary | ICD-10-CM | POA: Diagnosis not present

## 2023-04-18 DIAGNOSIS — E119 Type 2 diabetes mellitus without complications: Secondary | ICD-10-CM | POA: Diagnosis not present

## 2023-04-18 NOTE — Patient Instructions (Addendum)
-   Resume activity at [facility] 2 days/week beginning the week of 5/19.   - Incorporate more vegetables using microwaveable options.

## 2023-04-18 NOTE — Progress Notes (Signed)
Medical Nutrition Therapy:  Appt start time: 2:18  Appt end time: 3:00  *Pt states she would like for me to connect with new RD for collaboration of care; signed ROI (01/18/2022).   Assessment: Primary concerns today:   States she worked out on Friday at the gym and has been sticking to once/week since dental work began a few weeks ago. States recent FBS range from (184-382); a slight increase from previous appt. States she is still taking 1 mg of Ozempic, administers Sunday night and Vitamin D once/week. States she has dental appts on Mondays and had to decrease exercise days to once/week due to schedule conflict. Last dental appt is 5/22. States she hasn't made diabetes-friendly cookies as replacement for current cookie option since previous visit; states she is lazy. Reports eating less nuts due to dental restrictions..   Previous visit: Will see endocrinologist, Dr. Roanna Raider, again on 6/11. Reports when at parents home FBS numbers were 107-185. States while at parent's house she didn't eat waffles with breakfast or snack as much because she forgot to pack her snack bag. States she has had medication changes; now taking [place] and Ozempic. States she follows-up in 2 months, 12/2022. Reports since changing medications, FBS readings have decreased. 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 States she is decreasing snack bag portion to half a bag instead of the whole bag as each snack option.   Pt states her recent A1c has increased to 11.5% from 11.4%. Additional lab results from 02/2022 are: Urine microalbumin 5.75H Urine MA/CR Ratio 134.3H Glu 283H Chol 143 Trig 135 HDLD 34 LDL Chol 84 eGFR 126  States she was referred to a dietitian within the [facility] system and has upcoming appt next week, 2/21.  Current medication usage:  Xultophy (combination of degludec and liraglutide) - takes at night  Losartan  Metformin Low dose aspirin  Diagnosed with diabetes at least 10 years ago. Reports  next appt with PCP is 9/26. States she does not recall A1c from PCP appt. On days when she eats evening snack of multiple items, FBS is elevated. On days when evening snack is nonexistent, FBS are closer to 130. 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 States she can sense satisfaction when eating.   Pt states she likes butternut squash, cauliflower (faux potatoes), broccoli with cheese or ranch, green beans, peas, beets as vegetable options. Pt loves veggie tots.     Preferred Learning Style:  No preference indicated   Learning Readiness:  Ready Change in progress   MEDICATIONS: See list   DIETARY INTAKE:  Usual eating pattern includes 3 meals and 2 snacks per day.  Everyday foods include yogurt, nuts, berries, veggie tots, fried chicken, green beans, steamed broccoli, snow peas. Avoided foods include some vegetables.    24-hr recall:  B (8 AM): cereal + grapes + cottage cheese + cookies Snk (11 AM):  L (12-1 PM): [employer] - 6" oven roasted chicken sub + chips + diet soda + cookies Snk (2 PM): D (6-7 PM): salad bowl + PBJ + greek yogurt mixed with Grain berry cereal + grapes + cookies  Snk (PM):    Beverages: water (64 oz), coffee (20 oz), sparkling water (33 oz), diet soda (occasionally); 117+ oz  Usual physical activity: seated elliptical 60 min, once/week at gym, walking (sometimes)  Estimated energy needs: 2000-2200 calories 225-248 g carbohydrates 150-165 g protein 56-61 g fat  Progress Towards Goal(s):  Some progress.   Nutritional Diagnosis:  NB-1.5 Disordered eating pattern As related to balanced meals.  As evidenced by pt reports indulging.    Intervention:  Nutrition education and counseling. Encouraged with maintaining movement at local gym. Pt wants to increase raw vegetable intake and discussed how to do this. Pt agreed  with goals. Goals: - Resume activity at [facility] 2 days/week beginning the week of 5/19.  - Incorporate more vegetables using microwaveable options.   Teaching Method Utilized:  Visual Auditory Hands on  Handouts given during visit include: none  Barriers to learning/adherence to lifestyle change: none identified  Demonstrated degree of understanding via:  Teach Back   Monitoring/Evaluation:  Dietary intake, exercise, and body weight in 1 month(s).

## 2023-05-16 ENCOUNTER — Ambulatory Visit: Payer: Medicare HMO | Admitting: Registered"

## 2023-05-16 DIAGNOSIS — E7800 Pure hypercholesterolemia, unspecified: Secondary | ICD-10-CM | POA: Diagnosis not present

## 2023-05-16 DIAGNOSIS — E1121 Type 2 diabetes mellitus with diabetic nephropathy: Secondary | ICD-10-CM | POA: Diagnosis not present

## 2023-05-16 DIAGNOSIS — E1165 Type 2 diabetes mellitus with hyperglycemia: Secondary | ICD-10-CM | POA: Diagnosis not present

## 2023-05-16 DIAGNOSIS — E559 Vitamin D deficiency, unspecified: Secondary | ICD-10-CM | POA: Diagnosis not present

## 2023-05-17 ENCOUNTER — Encounter: Payer: Self-pay | Admitting: Podiatry

## 2023-05-17 ENCOUNTER — Ambulatory Visit (INDEPENDENT_AMBULATORY_CARE_PROVIDER_SITE_OTHER): Payer: Medicare HMO | Admitting: Podiatry

## 2023-05-17 DIAGNOSIS — M79609 Pain in unspecified limb: Secondary | ICD-10-CM | POA: Diagnosis not present

## 2023-05-17 DIAGNOSIS — B351 Tinea unguium: Secondary | ICD-10-CM | POA: Diagnosis not present

## 2023-05-17 DIAGNOSIS — G809 Cerebral palsy, unspecified: Secondary | ICD-10-CM

## 2023-05-17 DIAGNOSIS — E119 Type 2 diabetes mellitus without complications: Secondary | ICD-10-CM | POA: Diagnosis not present

## 2023-05-17 NOTE — Progress Notes (Signed)
This patient returns to my office for at risk foot care.  This patient requires this care by a professional since this patient will be at risk due to having diabetes.  This patient is unable to cut nails herself since the patient cannot reach her nails.These nails are painful walking and wearing shoes.  This patient presents for at risk foot care today.    General Appearance  Alert, conversant and in no acute stress.  Vascular  Dorsalis pedis and posterior tibial  pulses are weakly  palpable  due to swelling  bilaterally.  Capillary return is within normal limits  bilaterally. Temperature is within normal limits  Bilaterally. Swelling feet/legs  B/L.  Neurologic  Senn-Weinstein monofilament wire test within normal limits  bilaterally. Muscle power within normal limits bilaterally.  Nails Thick disfigured discolored nails with subungual debris  from hallux to fifth toes bilaterally. No evidence of bacterial infection or drainage bilaterally.  Orthopedic  No limitations of motion  feet .  No crepitus or effusions noted.  No bony pathology or digital deformities noted.  Skin  normotropic skin with no porokeratosis noted bilaterally.  No signs of infections or ulcers noted.   Red swollen feet  B/L.  Onychomycosis  Pain in right toes  Pain in left toes  Consent was obtained for treatment procedures.   Mechanical debridement of nails 1-5  bilaterally performed with a nail nipper.  Patient requests no dremel tool usage.   Return office visit 9 weeks                  Told patient to return for periodic foot care and evaluation due to potential at risk complications.   Lex Linhares DPM  

## 2023-05-23 DIAGNOSIS — E1165 Type 2 diabetes mellitus with hyperglycemia: Secondary | ICD-10-CM | POA: Diagnosis not present

## 2023-05-23 DIAGNOSIS — Z6841 Body Mass Index (BMI) 40.0 and over, adult: Secondary | ICD-10-CM | POA: Diagnosis not present

## 2023-05-23 DIAGNOSIS — E7800 Pure hypercholesterolemia, unspecified: Secondary | ICD-10-CM | POA: Diagnosis not present

## 2023-07-03 ENCOUNTER — Other Ambulatory Visit: Payer: Self-pay | Admitting: Podiatry

## 2023-07-11 DIAGNOSIS — Z6841 Body Mass Index (BMI) 40.0 and over, adult: Secondary | ICD-10-CM | POA: Diagnosis not present

## 2023-07-11 DIAGNOSIS — E1165 Type 2 diabetes mellitus with hyperglycemia: Secondary | ICD-10-CM | POA: Diagnosis not present

## 2023-07-11 DIAGNOSIS — E7800 Pure hypercholesterolemia, unspecified: Secondary | ICD-10-CM | POA: Diagnosis not present

## 2023-07-12 ENCOUNTER — Ambulatory Visit: Payer: Medicare HMO | Admitting: Registered"

## 2023-07-26 ENCOUNTER — Ambulatory Visit (INDEPENDENT_AMBULATORY_CARE_PROVIDER_SITE_OTHER): Payer: Medicare HMO | Admitting: Podiatry

## 2023-07-26 ENCOUNTER — Encounter: Payer: Self-pay | Admitting: Podiatry

## 2023-07-26 DIAGNOSIS — G809 Cerebral palsy, unspecified: Secondary | ICD-10-CM | POA: Diagnosis not present

## 2023-07-26 DIAGNOSIS — B351 Tinea unguium: Secondary | ICD-10-CM

## 2023-07-26 DIAGNOSIS — E119 Type 2 diabetes mellitus without complications: Secondary | ICD-10-CM

## 2023-07-26 DIAGNOSIS — M79609 Pain in unspecified limb: Secondary | ICD-10-CM | POA: Diagnosis not present

## 2023-07-26 NOTE — Progress Notes (Signed)
This patient returns to my office for at risk foot care.  This patient requires this care by a professional since this patient will be at risk due to having diabetes.  This patient is unable to cut nails herself since the patient cannot reach her nails.These nails are painful walking and wearing shoes.  This patient presents for at risk foot care today.    General Appearance  Alert, conversant and in no acute stress.  Vascular  Dorsalis pedis and posterior tibial  pulses are weakly  palpable  due to swelling  bilaterally.  Capillary return is within normal limits  bilaterally. Temperature is within normal limits  Bilaterally. Swelling feet/legs  B/L.  Neurologic  Senn-Weinstein monofilament wire test within normal limits  bilaterally. Muscle power within normal limits bilaterally.  Nails Thick disfigured discolored nails with subungual debris  from hallux to fifth toes bilaterally. No evidence of bacterial infection or drainage bilaterally.  Orthopedic  No limitations of motion  feet .  No crepitus or effusions noted.  No bony pathology or digital deformities noted.  Skin  normotropic skin with no porokeratosis noted bilaterally.  No signs of infections or ulcers noted.   Red swollen feet  B/L.  Onychomycosis  Pain in right toes  Pain in left toes  Consent was obtained for treatment procedures.   Mechanical debridement of nails 1-5  bilaterally performed with a nail nipper.  Patient requests no dremel tool usage.   Return office visit 9 weeks                  Told patient to return for periodic foot care and evaluation due to potential at risk complications.   Gardiner Barefoot DPM

## 2023-08-15 DIAGNOSIS — E1165 Type 2 diabetes mellitus with hyperglycemia: Secondary | ICD-10-CM | POA: Diagnosis not present

## 2023-08-15 DIAGNOSIS — E7800 Pure hypercholesterolemia, unspecified: Secondary | ICD-10-CM | POA: Diagnosis not present

## 2023-08-15 DIAGNOSIS — Z6841 Body Mass Index (BMI) 40.0 and over, adult: Secondary | ICD-10-CM | POA: Diagnosis not present

## 2023-08-29 DIAGNOSIS — D8481 Immunodeficiency due to conditions classified elsewhere: Secondary | ICD-10-CM | POA: Diagnosis not present

## 2023-08-29 DIAGNOSIS — E1165 Type 2 diabetes mellitus with hyperglycemia: Secondary | ICD-10-CM | POA: Diagnosis not present

## 2023-08-29 LAB — LAB REPORT - SCANNED
A1c: 11.4
Creatinine, POC: 121 mg/dL
EGFR: 126
Microalb Creat Ratio: 61.6
Microalbumin, Urine: 7.48

## 2023-08-30 ENCOUNTER — Encounter: Payer: Self-pay | Admitting: Registered"

## 2023-08-30 ENCOUNTER — Encounter: Payer: Medicare HMO | Attending: Internal Medicine | Admitting: Registered"

## 2023-08-30 DIAGNOSIS — E119 Type 2 diabetes mellitus without complications: Secondary | ICD-10-CM | POA: Diagnosis not present

## 2023-08-30 DIAGNOSIS — Z713 Dietary counseling and surveillance: Secondary | ICD-10-CM | POA: Diagnosis not present

## 2023-08-30 DIAGNOSIS — F509 Eating disorder, unspecified: Secondary | ICD-10-CM | POA: Diagnosis not present

## 2023-08-30 NOTE — Progress Notes (Signed)
Medical Nutrition Therapy:  Appt start time: 2:00  Appt end time: 2:52  *Pt states she would like for me to connect with new RD for collaboration of care; signed ROI (01/18/2022).   Assessment: Primary concerns today:   States she saw endocrinologist yesterday, 9/24. States she has increased Vitamin D dosage to 5000 units a day instead of 1000 units a day. Reports she had bloodwork and urine sample done at the end of her appt yesterday and still awaiting results.   States she has increased physical activity since previous visit. States she went to the gym Monday and Tuesday and will go again on Friday this week; seated elliptical for 60 min and has increased to level 4. States she hasn't added in more micrawaveable vegetables to her day. States she has the vegetables at home but has not been including them.    Reports recent FBS range from (174-415). States she is still taking 1 mg of Ozempic and giving 70 units of Tresiba unless at the end of her pen and will give herself 52, 53, or 55 units.   Previous visit: Reports when at parents home FBS numbers were 107-185. States while at parent's house she didn't eat waffles with breakfast or snack as much because she forgot to pack her snack bag. States she has had medication changes; now taking Guinea-Bissau and Ozempic. States she follows-up in 2 months, 12/2022. Reports since changing medications, FBS readings have decreased. 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 States she is decreasing snack bag portion to half a bag instead of the whole bag as each snack option.   Pt states her recent A1c has increased to 11.5% from 11.4%. Additional lab results from 02/2022 are: Urine microalbumin 5.75H Urine MA/CR Ratio 134.3H Glu 283H Chol 143 Trig 135 HDLD 34 LDL Chol 84 eGFR 126  States she was referred to a dietitian within the [facility] system and has upcoming appt next week, 2/21.  Current medication usage:  Xultophy (combination of degludec and  liraglutide) - takes at night  Losartan  Metformin Low dose aspirin  Diagnosed with diabetes at least 10 years ago. Reports next appt with PCP is 9/26. States she does not recall A1c from PCP appt. On days when she eats evening snack of multiple items, FBS is elevated. On days when evening snack is nonexistent, FBS are closer to 130. 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 States she can sense satisfaction when eating.   Pt states she likes butternut squash, cauliflower (faux potatoes), broccoli with cheese or ranch, green beans, peas, beets as vegetable options. Pt loves veggie tots.     Preferred Learning Style:  No preference indicated   Learning Readiness:  Ready Change in progress   MEDICATIONS: See list   DIETARY INTAKE:  Usual eating pattern includes 3 meals and 2 snacks per day.  Everyday foods include yogurt, nuts, berries, veggie tots, fried chicken, green beans, steamed broccoli, snow peas. Avoided foods include some vegetables.    24-hr recall:  B (8 AM): waffles + sugar free syrup + trail mix (pretzel bites, almonds, grapes)  Snk (11 AM):  L (12-1 PM): PBJ + salad + trail mix or [employer] - 6" oven roasted chicken sub + chips + diet soda + cookies Snk (2 PM): D (6-7 PM): salad bowl + PBJ +  greek yogurt mixed with Grain berry cereal + grapes Snk (PM):    Beverages: water (64 oz), coffee (20 oz), sparkling water (33 oz), diet soda (occasionally); 117+ oz  Usual physical activity: seated elliptical 60 min, 2-3x/week at gym  Estimated energy needs: 2000-2200 calories 225-248 g carbohydrates 150-165 g protein 56-61 g fat  Progress Towards Goal(s):  Some progress.   Nutritional Diagnosis:  NB-1.5 Disordered eating pattern As related to balanced meals.  As evidenced by pt reports indulging.    Intervention:  Mainly  listened. Nutrition education and counseling. Encouraged pt with maintaining movement at local gym. Pt agreed with goals. Goals: - Resume activity at [facility] at least 2 days/week. - Incorporate more vegetables using microwaveable options.  - Bring recent labs to next nutrition visit.   Teaching Method Utilized:  Visual Auditory Hands on  Handouts given during visit include: none  Barriers to learning/adherence to lifestyle change: none identified  Demonstrated degree of understanding via:  Teach Back   Monitoring/Evaluation:  Dietary intake, exercise, and body weight in 7 week(s).

## 2023-09-19 DIAGNOSIS — Z6841 Body Mass Index (BMI) 40.0 and over, adult: Secondary | ICD-10-CM | POA: Diagnosis not present

## 2023-09-19 DIAGNOSIS — E1165 Type 2 diabetes mellitus with hyperglycemia: Secondary | ICD-10-CM | POA: Diagnosis not present

## 2023-09-19 DIAGNOSIS — E559 Vitamin D deficiency, unspecified: Secondary | ICD-10-CM | POA: Diagnosis not present

## 2023-09-19 DIAGNOSIS — E7800 Pure hypercholesterolemia, unspecified: Secondary | ICD-10-CM | POA: Diagnosis not present

## 2023-09-27 ENCOUNTER — Encounter: Payer: Self-pay | Admitting: Podiatry

## 2023-09-27 ENCOUNTER — Ambulatory Visit (INDEPENDENT_AMBULATORY_CARE_PROVIDER_SITE_OTHER): Payer: Medicare HMO | Admitting: Podiatry

## 2023-09-27 DIAGNOSIS — G809 Cerebral palsy, unspecified: Secondary | ICD-10-CM

## 2023-09-27 DIAGNOSIS — B351 Tinea unguium: Secondary | ICD-10-CM

## 2023-09-27 DIAGNOSIS — E119 Type 2 diabetes mellitus without complications: Secondary | ICD-10-CM

## 2023-09-27 DIAGNOSIS — M79609 Pain in unspecified limb: Secondary | ICD-10-CM

## 2023-09-27 NOTE — Progress Notes (Signed)
This patient returns to my office for at risk foot care.  This patient requires this care by a professional since this patient will be at risk due to having diabetes.  This patient is unable to cut nails herself since the patient cannot reach her nails.These nails are painful walking and wearing shoes.  This patient presents for at risk foot care today.    General Appearance  Alert, conversant and in no acute stress.  Vascular  Dorsalis pedis and posterior tibial  pulses are weakly  palpable  due to swelling  bilaterally.  Capillary return is within normal limits  bilaterally. Temperature is within normal limits  Bilaterally. Swelling feet/legs  B/L.  Neurologic  Senn-Weinstein monofilament wire test within normal limits  bilaterally. Muscle power within normal limits bilaterally.  Nails Thick disfigured discolored nails with subungual debris  from hallux to fifth toes bilaterally. No evidence of bacterial infection or drainage bilaterally.  Orthopedic  No limitations of motion  feet .  No crepitus or effusions noted.  No bony pathology or digital deformities noted.  Skin  normotropic skin with no porokeratosis noted bilaterally.  No signs of infections or ulcers noted.   Red swollen feet  B/L.  Onychomycosis  Pain in right toes  Pain in left toes  Consent was obtained for treatment procedures.   Mechanical debridement of nails 1-5  bilaterally performed with a nail nipper.  Patient requests no dremel tool usage.   Return office visit 9 weeks                  Told patient to return for periodic foot care and evaluation due to potential at risk complications.   Gardiner Barefoot DPM

## 2023-10-09 DIAGNOSIS — G808 Other cerebral palsy: Secondary | ICD-10-CM | POA: Diagnosis not present

## 2023-10-09 DIAGNOSIS — E1121 Type 2 diabetes mellitus with diabetic nephropathy: Secondary | ICD-10-CM | POA: Diagnosis not present

## 2023-10-09 DIAGNOSIS — E7800 Pure hypercholesterolemia, unspecified: Secondary | ICD-10-CM | POA: Diagnosis not present

## 2023-10-09 DIAGNOSIS — Z794 Long term (current) use of insulin: Secondary | ICD-10-CM | POA: Diagnosis not present

## 2023-10-09 DIAGNOSIS — E559 Vitamin D deficiency, unspecified: Secondary | ICD-10-CM | POA: Diagnosis not present

## 2023-10-09 DIAGNOSIS — K219 Gastro-esophageal reflux disease without esophagitis: Secondary | ICD-10-CM | POA: Diagnosis not present

## 2023-10-09 DIAGNOSIS — Z6841 Body Mass Index (BMI) 40.0 and over, adult: Secondary | ICD-10-CM | POA: Diagnosis not present

## 2023-10-09 DIAGNOSIS — D500 Iron deficiency anemia secondary to blood loss (chronic): Secondary | ICD-10-CM | POA: Diagnosis not present

## 2023-10-18 ENCOUNTER — Encounter: Payer: Self-pay | Admitting: Registered"

## 2023-10-18 ENCOUNTER — Encounter: Payer: Medicare HMO | Attending: Internal Medicine | Admitting: Registered"

## 2023-10-18 DIAGNOSIS — E119 Type 2 diabetes mellitus without complications: Secondary | ICD-10-CM

## 2023-10-18 DIAGNOSIS — F50819 Binge eating disorder, unspecified: Secondary | ICD-10-CM | POA: Diagnosis not present

## 2023-10-18 DIAGNOSIS — Z713 Dietary counseling and surveillance: Secondary | ICD-10-CM | POA: Diagnosis not present

## 2023-10-18 DIAGNOSIS — E559 Vitamin D deficiency, unspecified: Secondary | ICD-10-CM | POA: Diagnosis not present

## 2023-10-18 DIAGNOSIS — E1165 Type 2 diabetes mellitus with hyperglycemia: Secondary | ICD-10-CM | POA: Diagnosis not present

## 2023-10-18 NOTE — Progress Notes (Signed)
Medical Nutrition Therapy:  Appt start time: 2:03  Appt end time: 2:51  *Pt states she would like for me to connect with new RD for collaboration of care; signed ROI (01/18/2022).   Assessment: Primary concerns today:   States she saw endocrinologist yesterday, 9/24. Does not know when she sees her again. States she was told to increase 10 units each month until she can get her FBS in the 100's. States she still hasn't called to increase it for November. States she is currently administering 80 units of Guinea-Bissau.  States she is still at 1 mg of Ozempic and unsure of the plan is to increase the dosage.   States she is still seeing Jae Dire and will see again at the end of November.   Reports recent FBS (230-399) and postprandial ([id number]). States she doesn't check consistently 2 hours after meals; unaware of how much time passes. Recent labs from 08/2023: Elevated A1c 11.4% Elevated Glu 319 Low Creatinine 0.44  Low Na 132 Low Cl 97 Elevated UMA Elevated MA/CR ratio 61.6 Elevated Fructosamine 362 Lab work reports iron deficiency anemia.   States she is going to the gym 2x/week and wouldn't be able to increase it due to spending time with her dad.   Previous visit: Reports when at parents home FBS numbers were 107-185. States while at parent's house she didn't eat waffles with breakfast or snack as much because she forgot to pack her snack bag. States she has had medication changes; now taking Guinea-Bissau and Ozempic. States she follows-up in 2 months, 12/2022. Reports since changing medications, FBS readings have decreased. 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 States she is decreasing snack bag portion to half a bag instead of the whole bag as each snack option.   Pt states her recent A1c has increased to 11.5% from 11.4%. Additional lab results from 02/2022 are: Urine microalbumin 5.75H Urine MA/CR Ratio 134.3H Glu 283H Chol 143 Trig 135 HDLD 34 LDL Chol 84 eGFR 126  States she  was referred to a dietitian within the [facility] system and has upcoming appt next week, 2/21.  Current medication usage:  Xultophy (combination of degludec and liraglutide) - takes at night  Losartan  Metformin Low dose aspirin  Diagnosed with diabetes at least 10 years ago. Reports next appt with PCP is 9/26. States she does not recall A1c from PCP appt. On days when she eats evening snack of multiple items, FBS is elevated. On days when evening snack is nonexistent, FBS are closer to 130. 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 States she can sense satisfaction when eating.   Pt states she likes butternut squash, cauliflower (faux potatoes), broccoli with cheese or ranch, green beans, peas, beets as vegetable options. Pt loves veggie tots.     Preferred Learning Style:  No preference indicated   Learning Readiness:  Ready Change in progress   MEDICATIONS: See list   DIETARY INTAKE:  Usual eating pattern includes 3 meals and 2 snacks per day.  Everyday foods include yogurt, nuts, berries, veggie tots, fried chicken, green beans, steamed broccoli, snow peas. Avoided foods include some vegetables.    24-hr recall:  B (8 AM): 2 waffles + sugar free syrup + trail mix (pretzel bites, almonds, grapes) or cereal + trail mix + almonds + grapes + cookies  Snk (11 AM):  L (12-1 PM): PBJ + salad + trail mix  Snk (2 PM): trail mix D (6-7 PM): sometimes skips or salad bowl + PBJ + greek yogurt mixed with Grain berry cereal + grapes Snk (PM):    Beverages: water (64 oz), coffee (20 oz), sparkling water (33 oz), diet soda (occasionally); 117+ oz  Usual physical activity: seated elliptical (level 4) 60 min, 2x/week at gym  Estimated energy needs: 2000-2200 calories 225-248 g carbohydrates 150-165 g protein 56-61 g fat  Progress Towards Goal(s):   Some progress.   Nutritional Diagnosis:  NB-1.5 Disordered eating pattern As related to balanced meals.  As evidenced by pt reports indulging.    Intervention:  Mainly listened. Nutrition education and counseling. Encouraged pt with maintaining movement at local gym. Discussed an alternative after-dinner dessert option to help reduce carbohydrate intake. Pt agreed with goals. Goals: - Aim to have fruit popsicle after dinner instead of yogurt with cereal and fruit.  - Get paper copy of labs from Sept and future endocrinologist appts.  - When checking blood sugar numbers after meals, wait 2 hours after first bite of food.  Teaching Method Utilized:  Visual Auditory Hands on  Handouts given during visit include: none  Barriers to learning/adherence to lifestyle change: none identified  Demonstrated degree of understanding via:  Teach Back   Monitoring/Evaluation:  Dietary intake, exercise, and body weight in 8 week(s).

## 2023-10-18 NOTE — Patient Instructions (Addendum)
-   Aim to have fruit popsicle after dinner instead of yogurt with cereal and fruit.   - Get paper copy of labs from Sept and future endocrinologist appts.   - When checking blood sugar numbers after meals, wait 2 hours after first bite of food.

## 2023-10-31 DIAGNOSIS — E7800 Pure hypercholesterolemia, unspecified: Secondary | ICD-10-CM | POA: Diagnosis not present

## 2023-10-31 DIAGNOSIS — D500 Iron deficiency anemia secondary to blood loss (chronic): Secondary | ICD-10-CM | POA: Diagnosis not present

## 2023-10-31 DIAGNOSIS — Z6841 Body Mass Index (BMI) 40.0 and over, adult: Secondary | ICD-10-CM | POA: Diagnosis not present

## 2023-10-31 DIAGNOSIS — E559 Vitamin D deficiency, unspecified: Secondary | ICD-10-CM | POA: Diagnosis not present

## 2023-10-31 DIAGNOSIS — E1165 Type 2 diabetes mellitus with hyperglycemia: Secondary | ICD-10-CM | POA: Diagnosis not present

## 2023-11-30 ENCOUNTER — Ambulatory Visit: Payer: Medicare HMO | Admitting: Podiatry

## 2023-11-30 ENCOUNTER — Encounter: Payer: Self-pay | Admitting: Podiatry

## 2023-11-30 DIAGNOSIS — B351 Tinea unguium: Secondary | ICD-10-CM | POA: Diagnosis not present

## 2023-11-30 DIAGNOSIS — G809 Cerebral palsy, unspecified: Secondary | ICD-10-CM | POA: Diagnosis not present

## 2023-11-30 DIAGNOSIS — M79609 Pain in unspecified limb: Secondary | ICD-10-CM

## 2023-11-30 DIAGNOSIS — E119 Type 2 diabetes mellitus without complications: Secondary | ICD-10-CM | POA: Diagnosis not present

## 2023-11-30 NOTE — Progress Notes (Signed)
This patient returns to my office for at risk foot care.  This patient requires this care by a professional since this patient will be at risk due to having diabetes.  This patient is unable to cut nails herself since the patient cannot reach her nails.These nails are painful walking and wearing shoes.  This patient presents for at risk foot care today.    General Appearance  Alert, conversant and in no acute stress.  Vascular  Dorsalis pedis and posterior tibial  pulses are weakly  palpable  due to swelling  bilaterally.  Capillary return is within normal limits  bilaterally. Temperature is within normal limits  Bilaterally. Swelling feet/legs  B/L.  Neurologic  Senn-Weinstein monofilament wire test within normal limits  bilaterally. Muscle power within normal limits bilaterally.  Nails Thick disfigured discolored nails with subungual debris  from hallux to fifth toes bilaterally. No evidence of bacterial infection or drainage bilaterally.  Orthopedic  No limitations of motion  feet .  No crepitus or effusions noted.  No bony pathology or digital deformities noted.  Skin  normotropic skin with no porokeratosis noted bilaterally.  No signs of infections or ulcers noted.   Red swollen feet  B/L.  Onychomycosis  Pain in right toes  Pain in left toes  Consent was obtained for treatment procedures.   Mechanical debridement of nails 1-5  bilaterally performed with a nail nipper.  Patient requests no dremel tool usage.   Return office visit 9 weeks                  Told patient to return for periodic foot care and evaluation due to potential at risk complications.   Gardiner Barefoot DPM

## 2023-12-04 DIAGNOSIS — E119 Type 2 diabetes mellitus without complications: Secondary | ICD-10-CM | POA: Diagnosis not present

## 2023-12-05 DIAGNOSIS — E1165 Type 2 diabetes mellitus with hyperglycemia: Secondary | ICD-10-CM | POA: Diagnosis not present

## 2023-12-27 ENCOUNTER — Encounter: Payer: Medicare HMO | Attending: Internal Medicine | Admitting: Registered"

## 2023-12-27 DIAGNOSIS — F50819 Binge eating disorder, unspecified: Secondary | ICD-10-CM | POA: Diagnosis not present

## 2023-12-27 DIAGNOSIS — E119 Type 2 diabetes mellitus without complications: Secondary | ICD-10-CM

## 2023-12-27 NOTE — Patient Instructions (Signed)
-   Aim to increase physical activity with attending gym on Tuesdays, along with Mondays and Fridays.

## 2023-12-27 NOTE — Progress Notes (Signed)
Medical Nutrition Therapy:  Appt start time: 2:01  Appt end time: 3:08  *Pt states she would like for me to connect with new RD for collaboration of care; signed ROI (01/18/2022).   Assessment: Primary concerns today:   States she has increased Guinea-Bissau to 90 units. States she has recently changed her eating regimen to now only eating salads when she is at home and taking sandwiches or other items when she eats out. Reports she thinks she needs to eat less because she was told she needs to lose weight. Reports had labs done but does not recall. States she is supposed to contact pharmacy for increased dosage of Ozempic 2 mg, but has not called them yet. States she is still checking FBS daily. Trends shows that Tuesday  FBS are (122-199 mg/dL) and by Friday and Saturday FBS are (200-300+ mg/dL). Pt and I discussed what's her routine looks like during the week. States her typical week consists of:  Sundays - goes to church and comes home; all meals at home Mondays - [facility] at [facility] (60 min cardio) + [employer] with mom (low carb burger + 1/2 fries) + grocery store run +  salad bowls for dinner Tuesdays - hangs out with dad sometimes + does not recall rest of day Wednesdays - working + meals at home Thursdays - working + meals at home Fridays - exercise at [employer] (60 min cardio) + [employer], [employer], [employer], or lunch at home with friend Saturdays - 2 meals at home and possible meal out; volunteers in the afternoon  Reports when eating [place] with mom, she orders 6" oven roasted chicken sandwich, spinach, olives, onions, honey mustard. Reports when ordering Lance Muss with friend she gets something different, meatball sub with spinach, olives, onions; does not recall if its 6" or not.   Previous visit:  Reports recent FBS (230-399) and postprandial (161-096). States she doesn't check consistently 2 hours after meals; unaware of how much time passes. Recent labs from 08/2023: Elevated A1c 11.4% Elevated Glu 319 Low  Creatinine 0.44  Low Na 132 Low Cl 97 Elevated UMA Elevated MA/CR ratio 61.6 Elevated Fructosamine 362 Lab work reports iron deficiency anemia.  Reports when at parents home FBS numbers were 107-185. States while at parent's house she didn't eat waffles with breakfast or snack as much because she forgot to pack her snack bag. States she has had medication changes; now taking Guinea-Bissau and Ozempic. States she follows-up in 2 months, 12/2022. Reports since changing medications, FBS readings have decreased. 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 States she is decreasing snack bag portion to half a bag instead of the whole bag as each snack option.   Pt states her recent A1c has increased to 11.5% from 11.4%. Additional lab results from 02/2022 are: Urine microalbumin 5.75H Urine MA/CR Ratio 134.3H Glu 283H Chol 143 Trig 135 HDLD 34 LDL Chol 84 eGFR 126  States she was referred to a dietitian within the [facility] system and has upcoming appt next week, 2/21.  Current medication usage:  Xultophy (combination of degludec and liraglutide) - takes at night  Losartan  Metformin Low dose aspirin  Diagnosed with diabetes at least 10 years ago. 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 States she can sense satisfaction when eating.   Pt states she likes butternut squash, cauliflower (faux potatoes),  broccoli with cheese or ranch, green beans, peas, beets as vegetable options. Pt loves veggie tots.     Preferred Learning Style:  No preference indicated   Learning Readiness:  Ready Change in progress   MEDICATIONS: See list   DIETARY INTAKE:  Usual eating pattern includes 3 meals and 2 snacks per day.  Everyday foods include yogurt, nuts, berries, veggie tots, fried chicken, green beans, steamed broccoli, snow peas. Avoided foods include some  vegetables.    24-hr recall:  B (8 AM): Glucerna + 2 waffles + sugar free syrup + trail mix (pretzel bites, almonds, grapes) or cereal + trail mix + almonds + grapes + cookies  Snk (11 AM):  L (12-1 PM): PBJ + salad + trail mix  Snk (2 PM): trail mix D (6-7 PM): sometimes skips or salad bowl + PBJ + greek yogurt mixed with Grain berry cereal + grapes Snk (PM):    Beverages: water (64 oz), coffee (20 oz), sparkling water (33 oz), diet soda (occasionally); 117+ oz  Usual physical activity: seated elliptical (level 4) 60 min, 2x/week at gym  Estimated energy needs: 2000-2200 calories 225-248 g carbohydrates 150-165 g protein 56-61 g fat  Progress Towards Goal(s):  Some progress.   Nutritional Diagnosis:  NB-1.5 Disordered eating pattern As related to balanced meals.  As evidenced by pt reports indulging.    Intervention:  Mainly listened. Nutrition education and counseling. Encouraged pt with maintaining movement at local gym and ways to increase gradually. Discussed correlation between lower blood glucose numbers and day to day activities. Discussed having conversation with dad about changing their Tuesday plans to accommodate her going to the gym that day as well. Pt agreed with goals. Goals: - Aim to increase physical activity with attending gym on Tuesdays, along with Mondays and Fridays.    Teaching Method Utilized:  Visual Auditory Hands on  Handouts given during visit include: none  Barriers to learning/adherence to lifestyle change: none identified  Demonstrated degree of understanding via:  Teach Back   Monitoring/Evaluation:  Dietary intake, exercise, and body weight in 4 week(s).

## 2024-01-04 DIAGNOSIS — E1165 Type 2 diabetes mellitus with hyperglycemia: Secondary | ICD-10-CM | POA: Diagnosis not present

## 2024-01-04 DIAGNOSIS — Z6841 Body Mass Index (BMI) 40.0 and over, adult: Secondary | ICD-10-CM | POA: Diagnosis not present

## 2024-01-04 DIAGNOSIS — D500 Iron deficiency anemia secondary to blood loss (chronic): Secondary | ICD-10-CM | POA: Diagnosis not present

## 2024-01-24 ENCOUNTER — Ambulatory Visit: Payer: Medicare HMO | Admitting: Registered"

## 2024-02-05 DIAGNOSIS — H5213 Myopia, bilateral: Secondary | ICD-10-CM | POA: Diagnosis not present

## 2024-02-06 DIAGNOSIS — E1121 Type 2 diabetes mellitus with diabetic nephropathy: Secondary | ICD-10-CM | POA: Diagnosis not present

## 2024-02-06 DIAGNOSIS — D500 Iron deficiency anemia secondary to blood loss (chronic): Secondary | ICD-10-CM | POA: Diagnosis not present

## 2024-02-06 DIAGNOSIS — Z6841 Body Mass Index (BMI) 40.0 and over, adult: Secondary | ICD-10-CM | POA: Diagnosis not present

## 2024-02-15 ENCOUNTER — Encounter: Payer: Self-pay | Admitting: Podiatry

## 2024-02-15 ENCOUNTER — Ambulatory Visit (INDEPENDENT_AMBULATORY_CARE_PROVIDER_SITE_OTHER): Payer: Medicare HMO | Admitting: Podiatry

## 2024-02-15 VITALS — Ht 65.0 in | Wt 259.0 lb

## 2024-02-15 DIAGNOSIS — B351 Tinea unguium: Secondary | ICD-10-CM | POA: Diagnosis not present

## 2024-02-15 DIAGNOSIS — G809 Cerebral palsy, unspecified: Secondary | ICD-10-CM | POA: Diagnosis not present

## 2024-02-15 DIAGNOSIS — M79609 Pain in unspecified limb: Secondary | ICD-10-CM

## 2024-02-15 DIAGNOSIS — E119 Type 2 diabetes mellitus without complications: Secondary | ICD-10-CM

## 2024-02-15 NOTE — Progress Notes (Signed)
 This patient returns to my office for at risk foot care.  This patient requires this care by a professional since this patient will be at risk due to having diabetes.  This patient is unable to cut nails herself since the patient cannot reach her nails.These nails are painful walking and wearing shoes.  This patient presents for at risk foot care today.    General Appearance  Alert, conversant and in no acute stress.  Vascular  Dorsalis pedis and posterior tibial  pulses are weakly  palpable  due to swelling  bilaterally.  Capillary return is within normal limits  bilaterally. Temperature is within normal limits  Bilaterally. Swelling feet/legs  B/L.  Neurologic  Senn-Weinstein monofilament wire test within normal limits  bilaterally. Muscle power within normal limits bilaterally.  Nails Thick disfigured discolored nails with subungual debris  from hallux to fifth toes bilaterally. No evidence of bacterial infection or drainage bilaterally.  Orthopedic  No limitations of motion  feet .  No crepitus or effusions noted.  No bony pathology or digital deformities noted.  Skin  normotropic skin with no porokeratosis noted bilaterally.  No signs of infections or ulcers noted.   Red swollen feet  B/L.  Onychomycosis  Pain in right toes  Pain in left toes  Consent was obtained for treatment procedures.   Mechanical debridement of nails 1-5  bilaterally performed with a nail nipper.  Patient requests no dremel tool usage.   Return office visit 12  weeks                  Told patient to return for periodic foot care and evaluation due to potential at risk complications.   Helane Gunther DPM

## 2024-02-21 ENCOUNTER — Encounter: Payer: Self-pay | Admitting: Registered"

## 2024-02-21 ENCOUNTER — Encounter: Payer: Medicare HMO | Attending: Internal Medicine | Admitting: Registered"

## 2024-02-21 DIAGNOSIS — E119 Type 2 diabetes mellitus without complications: Secondary | ICD-10-CM | POA: Diagnosis not present

## 2024-02-21 NOTE — Progress Notes (Signed)
 Medical Nutrition Therapy:   Appt start time: 2:04  Appt end time: 3:00  *Pt states she would like for me to connect with new RD for collaboration of care; signed ROI (01/18/2022).   Assessment: Primary concerns today:   States she met with other RD on 2/21 and working on eating less; does not recall specific goals. States she has been vomiting a few times randomly over the last few weeks and unsure why. States she started taking 2 mg of Ozempic on 2/9; gives herself dosage every Sunday. Reports taking pictures of meals from previous appt but unable to see pictures due to phone dying during the appt; also unable to discuss BS numbers.   Pt states she has increased physical activity from 2 days/week due to 3 days/week.   Previous visit:  Reports recent FBS (230-399) and postprandial (621-308). States she doesn't check consistently 2 hours after meals; unaware of how much time passes. Recent labs from 08/2023: Elevated A1c 11.4% Elevated Glu 319 Low Creatinine 0.44  Low Na 132 Low Cl 97 Elevated UMA Elevated MA/CR ratio 61.6 Elevated Fructosamine 362 Lab work reports iron deficiency anemia.  Reports when at parents home FBS numbers were 107-185. States while at parent's house she didn't eat waffles with breakfast or snack as much because she forgot to pack her snack bag. States she has had medication changes; now taking Guinea-Bissau and Ozempic. States she follows-up in 2 months, 12/2022. Reports since changing medications, FBS readings have decreased. 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 States she is decreasing snack bag portion to half a bag instead of the whole bag as each snack option.   Pt states her recent A1c has increased to 11.5% from 11.4%. Additional lab results from 02/2022 are: Urine microalbumin 5.75H Urine MA/CR Ratio 134.3H Glu 283H Chol 143 Trig 135 HDLD 34 LDL Chol 84 eGFR 126  States she was referred to a dietitian within the [facility] system and has upcoming appt  next week, 2/21.  Current medication usage:  Xultophy (combination of degludec and liraglutide) - takes at night  Losartan  Metformin Low dose aspirin  Diagnosed with diabetes at least 10 years ago. 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 States she can sense satisfaction when eating.   Pt states she likes butternut squash, cauliflower (faux potatoes), broccoli with cheese or ranch, green beans, peas, beets as vegetable options. Pt loves veggie tots.     Preferred Learning Style:  No preference indicated   Learning Readiness:  Ready Change in progress   MEDICATIONS: See list   DIETARY INTAKE:  Usual eating pattern includes 3 meals and 2 snacks per day.  Everyday foods include yogurt, nuts, berries, veggie tots, fried chicken, green beans, steamed broccoli, snow peas. Avoided foods include some vegetables.    24-hr recall: did  B (8 AM): Glucerna + 2 waffles + sugar free syrup + trail mix (pretzel bites, almonds, grapes) or cereal + trail mix + almonds + grapes + cookies  Snk (11 AM):  L (12-1 PM): PBJ + salad + trail mix + chocolate covered pomegranate seeds  Snk (2 PM): trail mix D (6-7 PM): sometimes skips or salad bowl + PBJ + greek yogurt mixed with Grain berry cereal + grapes Snk (PM):    Beverages: water (64 oz), coffee (20 oz), sparkling water (33  oz), diet soda (occasionally); 117+ oz  Usual physical activity: seated elliptical (level 4) 60 min, 3x/week at gym  Estimated energy needs: 2000-2200 calories 225-248 g carbohydrates 150-165 g protein 56-61 g fat  Progress Towards Goal(s):  Some progress.   Nutritional Diagnosis:  NB-1.5 Disordered eating pattern As related to balanced meals.  As evidenced by pt reports indulging.    Intervention:  Mainly listened. Nutrition education and counseling. Encouraged pt with  increasing movement. Discussed common side effects of Ozempic and discussed the importance of having mental health therapist when working through eating disorder concerns. Pt agreed with goals. Goals: - Replace trail mix with cottage cheese for breakfast.  - Look into mental health therapist. Check [email or web address]  Teaching Method Utilized:  Visual Auditory Hands on  Handouts given during visit include: none  Barriers to learning/adherence to lifestyle change: none identified  Demonstrated degree of understanding via:  Teach Back   Monitoring/Evaluation:  Dietary intake, exercise, and body weight in 4 week(s).

## 2024-02-21 NOTE — Patient Instructions (Signed)
-   Replace trail mix with cottage cheese for breakfast.   - Look into mental health therapist. Check [email or web address]

## 2024-02-22 DIAGNOSIS — M62838 Other muscle spasm: Secondary | ICD-10-CM | POA: Diagnosis not present

## 2024-02-27 DIAGNOSIS — L918 Other hypertrophic disorders of the skin: Secondary | ICD-10-CM | POA: Diagnosis not present

## 2024-02-27 DIAGNOSIS — M25512 Pain in left shoulder: Secondary | ICD-10-CM | POA: Diagnosis not present

## 2024-03-06 DIAGNOSIS — E1165 Type 2 diabetes mellitus with hyperglycemia: Secondary | ICD-10-CM | POA: Diagnosis not present

## 2024-03-12 DIAGNOSIS — E1165 Type 2 diabetes mellitus with hyperglycemia: Secondary | ICD-10-CM | POA: Diagnosis not present

## 2024-03-12 DIAGNOSIS — D500 Iron deficiency anemia secondary to blood loss (chronic): Secondary | ICD-10-CM | POA: Diagnosis not present

## 2024-03-12 DIAGNOSIS — Z6841 Body Mass Index (BMI) 40.0 and over, adult: Secondary | ICD-10-CM | POA: Diagnosis not present

## 2024-03-13 DIAGNOSIS — Z1151 Encounter for screening for human papillomavirus (HPV): Secondary | ICD-10-CM | POA: Diagnosis not present

## 2024-03-13 DIAGNOSIS — E1165 Type 2 diabetes mellitus with hyperglycemia: Secondary | ICD-10-CM | POA: Diagnosis not present

## 2024-03-13 DIAGNOSIS — G8191 Hemiplegia, unspecified affecting right dominant side: Secondary | ICD-10-CM | POA: Diagnosis not present

## 2024-03-13 DIAGNOSIS — R21 Rash and other nonspecific skin eruption: Secondary | ICD-10-CM | POA: Diagnosis not present

## 2024-03-13 DIAGNOSIS — Z01419 Encounter for gynecological examination (general) (routine) without abnormal findings: Secondary | ICD-10-CM | POA: Diagnosis not present

## 2024-03-13 DIAGNOSIS — G809 Cerebral palsy, unspecified: Secondary | ICD-10-CM | POA: Diagnosis not present

## 2024-03-13 DIAGNOSIS — Z124 Encounter for screening for malignant neoplasm of cervix: Secondary | ICD-10-CM | POA: Diagnosis not present

## 2024-03-13 DIAGNOSIS — N920 Excessive and frequent menstruation with regular cycle: Secondary | ICD-10-CM | POA: Diagnosis not present

## 2024-04-03 ENCOUNTER — Encounter: Attending: Internal Medicine | Admitting: Registered"

## 2024-04-03 ENCOUNTER — Encounter: Payer: Self-pay | Admitting: Registered"

## 2024-04-03 DIAGNOSIS — F50819 Binge eating disorder, unspecified: Secondary | ICD-10-CM | POA: Diagnosis not present

## 2024-04-03 DIAGNOSIS — E119 Type 2 diabetes mellitus without complications: Secondary | ICD-10-CM | POA: Diagnosis not present

## 2024-04-03 NOTE — Patient Instructions (Addendum)
-   Be mindful of meals and quantities increasing over time.

## 2024-04-03 NOTE — Progress Notes (Signed)
 Medical Nutrition Therapy:   Appt start time: 2:02  Appt end time: 2:57  *Pt states she would like for me to connect with new RD for collaboration of care; signed ROI (01/18/2022).   Assessment: Primary concerns today:   States she is back on Ozempic 1 mg; dosing on Sundays.  States she is tolerating well with no GI issues. States when taking 2 mg Ozempic she was vomiting and having diarrhea. States she stopped taking Ozempic on 03/01/2024 for a few weeks.   States her recent A1c decreased from 11.4% to 9.0% (03/2024). Recent FBS (119-272 mg/dL). Week of   States she has decreased from 2 waffles to 1 waffle for breakfast. Pt has been taking photos of her meals and snacks to help documents quantities and frequencies. Per photos, quantities tend to increase over time where one meal may be 1 waffle + cottage cheese + PB + Glucerna + syrup and a week later the same meal will have added trail mix + fruit + cookies.    States she met with other RD on 2/21 and working on eating less; does not recall specific goals. States she has been vomiting a few times randomly over the last few weeks and unsure why. States she started taking 2 mg of Ozempic on 2/9; gives herself dosage every Sunday. Reports taking pictures of meals from previous appt but unable to see pictures due to phone dying during the appt; also unable to discuss BS numbers.   Pt states she has increased physical activity from 2 days/week due to 3 days/week.   Previous visit:  Reports recent FBS (230-399) and postprandial (409-811). States she doesn't check consistently 2 hours after meals; unaware of how much time passes. Recent labs from 08/2023: Elevated A1c 11.4% Elevated Glu 319 Low Creatinine 0.44  Low Na 132 Low Cl 97 Elevated UMA Elevated MA/CR ratio 61.6 Elevated Fructosamine 362 Lab work reports iron deficiency anemia.  Reports when at parents home FBS numbers were 107-185. States while at parent's house she didn't eat waffles  with breakfast or snack as much because she forgot to pack her snack bag. States she has had medication changes; now taking Tresiba and Ozempic. States she follows-up in 2 months, 12/2022. Reports since changing medications, FBS readings have decreased. 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 States she is decreasing snack bag portion to half a bag instead of the whole bag as each snack option.   Pt states her recent A1c has increased to 11.5% from 11.4%. Additional lab results from 02/2022 are: Urine microalbumin 5.75H Urine MA/CR Ratio 134.3H Glu 283H Chol 143 Trig 135 HDLD 34 LDL Chol 84 eGFR 126  States she was referred to a dietitian within the [facility] system and has upcoming appt next week, 2/21.  Current medication usage:  Xultophy (combination of degludec and liraglutide) - takes at night  Losartan  Metformin Low dose aspirin  Diagnosed with diabetes at least 10 years ago. 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 States she can sense satisfaction when eating.   Pt states she likes butternut squash, cauliflower (faux potatoes), broccoli with cheese or ranch, green beans, peas, beets as vegetable options. Pt loves veggie tots.     Preferred Learning Style:  No preference indicated   Learning Readiness:  Ready Change in progress   MEDICATIONS: See  list   DIETARY INTAKE:  Usual eating pattern includes 3 meals and 2 snacks per day.  Everyday foods include yogurt, nuts, berries, veggie tots, fried chicken, green beans, steamed broccoli, snow peas. Avoided foods include some vegetables.    24-hr recall: did  B (8 AM): Glucerna + 1 waffle + sugar free syrup + trail mix (pretzel bites, almonds, grapes) or cereal + trail mix + almonds + grapes + cookies  Snk (11 AM): cheese + 3 crackers L (12-1 PM): PBJ + salad + trail mix +  chocolate covered pomegranate seeds  Snk (2 PM): D (6-7 PM): salad bowl + PBJ + greek yogurt mixed with Grain berry cereal + grapes + PB Snk (PM):    Beverages: water (99 oz), coffee (20 oz), sparkling water (33 oz), diet soda (occasionally); 117+ oz  Usual physical activity: seated elliptical (level 3) 60 min, 3x/week at gym  Estimated energy needs: 2000-2200 calories 225-248 g carbohydrates 150-165 g protein 56-61 g fat  Progress Towards Goal(s):  Some progress.   Nutritional Diagnosis:  NB-1.5 Disordered eating pattern As related to balanced meals.  As evidenced by pt reports indulging.    Intervention:  Mainly listened. Nutrition education and counseling. Encouraged pt with changes made since previous visit. Pt agreed with goals. Goals: - Be mindful of meals and quantities increasing over time.  - Look into mental health therapist. Check [email or web address]  Teaching Method Utilized:  Visual Auditory Hands on  Handouts given during visit include: none  Barriers to learning/adherence to lifestyle change: none identified  Demonstrated degree of understanding via:  Teach Back   Monitoring/Evaluation:  Dietary intake, exercise, and body weight in 4 week(s).

## 2024-04-15 DIAGNOSIS — E1165 Type 2 diabetes mellitus with hyperglycemia: Secondary | ICD-10-CM | POA: Diagnosis not present

## 2024-04-15 DIAGNOSIS — E1121 Type 2 diabetes mellitus with diabetic nephropathy: Secondary | ICD-10-CM | POA: Diagnosis not present

## 2024-04-15 DIAGNOSIS — D500 Iron deficiency anemia secondary to blood loss (chronic): Secondary | ICD-10-CM | POA: Diagnosis not present

## 2024-04-15 DIAGNOSIS — E7800 Pure hypercholesterolemia, unspecified: Secondary | ICD-10-CM | POA: Diagnosis not present

## 2024-04-15 DIAGNOSIS — E559 Vitamin D deficiency, unspecified: Secondary | ICD-10-CM | POA: Diagnosis not present

## 2024-04-16 DIAGNOSIS — E1165 Type 2 diabetes mellitus with hyperglycemia: Secondary | ICD-10-CM | POA: Diagnosis not present

## 2024-04-16 DIAGNOSIS — D500 Iron deficiency anemia secondary to blood loss (chronic): Secondary | ICD-10-CM | POA: Diagnosis not present

## 2024-04-16 DIAGNOSIS — Z6841 Body Mass Index (BMI) 40.0 and over, adult: Secondary | ICD-10-CM | POA: Diagnosis not present

## 2024-04-16 DIAGNOSIS — E7800 Pure hypercholesterolemia, unspecified: Secondary | ICD-10-CM | POA: Diagnosis not present

## 2024-04-24 ENCOUNTER — Encounter: Attending: Internal Medicine | Admitting: Registered"

## 2024-04-24 ENCOUNTER — Encounter: Payer: Self-pay | Admitting: Registered"

## 2024-04-24 DIAGNOSIS — E119 Type 2 diabetes mellitus without complications: Secondary | ICD-10-CM | POA: Insufficient documentation

## 2024-04-24 DIAGNOSIS — F50819 Binge eating disorder, unspecified: Secondary | ICD-10-CM | POA: Insufficient documentation

## 2024-04-24 NOTE — Progress Notes (Signed)
 Medical Nutrition Therapy:   Appt start time: 2:00  Appt end time: 2:55  *Pt states she would like for me to connect with new RD for collaboration of care; signed ROI (01/18/2022).   Assessment: Primary concerns today:   Pt arrives stating since 5/1 she has been able to keep FBS in the 100s. Recent FBS (112-185 mg/dL). States she is drinking 3*33 oz (99 oz) bottles of water a day. State she saw PCP on 5/12 and RD on 5/13 and will see endocrinologist on 7/2. States she is proud of herself. States she went out to eat with mom at [employer] recently and ordered grilled chicken salad. States she didn't eat all of food and could tell she had reached satiety when eating. Reports taking home leftovers and eating meal the next day for lunch.   Previous visit:  States she is back on Ozempic 1 mg; dosing on Sundays. States her recent A1c decreased from 11.4% to 9.0% (03/2024). Recent FBS (119-272 mg/dL). Pt states she has increased physical activity from 2 days/week due to 3 days/week.  States she doesn't check consistently 2 hours after meals; unaware of how much time passes. Recent labs from 08/2023: Elevated A1c 11.4% Elevated Glu 319 Low Creatinine 0.44  Low Na 132 Low Cl 97 Elevated UMA Elevated MA/CR ratio 61.6 Elevated Fructosamine 362 Lab work reports iron deficiency anemia.  States she has had medication changes; now taking Tresiba and Ozempic. States she follows-up in 2 months, 12/2022. Additional lab results from 02/2022 are: Urine microalbumin 5.75H Urine MA/CR Ratio 134.3H Glu 283H Chol 143 Trig 135 HDLD 34 LDL Chol 84 eGFR 126  Diagnosed with diabetes at least 10 years ago. 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 States she can sense satisfaction when eating.   Pt states she likes butternut squash, cauliflower (faux potatoes), broccoli with  cheese or ranch, green beans, peas, beets as vegetable options. Pt loves veggie tots.     Preferred Learning Style:  No preference indicated   Learning Readiness:  Ready Change in progress   MEDICATIONS: See list   DIETARY INTAKE:  Usual eating pattern includes 3 meals and 2 snacks per day.  Everyday foods include yogurt, nuts, berries, veggie tots, fried chicken, green beans, steamed broccoli, snow peas. Avoided foods include some vegetables.    24-hr recall: did  B (8 AM): Glucerna + 1 waffle + sugar free syrup + trail mix (pretzel bites, almonds, grapes) or cereal + trail mix + almonds + grapes + cookies  Snk (11 AM): cheese + 3 crackers L (12-1 PM): PBJ + salad + trail mix + chocolate covered pomegranate seeds  Snk (2 PM): D (6-7 PM): salad bowl + PBJ + greek yogurt mixed with Grain berry cereal + grapes + PB Snk (PM):    Beverages: water (99 oz), coffee (20 oz), sparkling water (33 oz), diet soda (occasionally); 117+ oz  Usual physical activity: seated elliptical (level 3) 60 min, 3x/week at gym  Estimated energy needs: 2000-2200 calories 225-248 g carbohydrates 150-165 g protein 56-61 g fat  Progress Towards Goal(s):  Some progress.   Nutritional Diagnosis:  NB-1.5 Disordered eating pattern As related to balanced meals.  As evidenced by pt reports indulging.    Intervention:  Mainly listened. Nutrition education and counseling. Encouraged pt with changes made since previous visit. Pt  is doing well! Pt agreed with goals. Goals: - Keep up the great work!  Teaching Method Utilized:  Visual Auditory Hands on  Handouts given during visit include: none  Barriers to learning/adherence to lifestyle change: none identified  Demonstrated degree of understanding via:  Teach Back   Monitoring/Evaluation:  Dietary intake, exercise, and body weight in 4 week(s).

## 2024-05-15 ENCOUNTER — Encounter: Payer: Self-pay | Admitting: Registered"

## 2024-05-15 ENCOUNTER — Encounter: Attending: Internal Medicine | Admitting: Registered"

## 2024-05-15 DIAGNOSIS — F50819 Binge eating disorder, unspecified: Secondary | ICD-10-CM | POA: Insufficient documentation

## 2024-05-15 DIAGNOSIS — E119 Type 2 diabetes mellitus without complications: Secondary | ICD-10-CM | POA: Diagnosis not present

## 2024-05-15 NOTE — Progress Notes (Signed)
 Medical Nutrition Therapy:   Appt start time: 2:05  Appt end time: 2:52  *Pt states she would like for me to connect with new RD for collaboration of care; signed ROI (01/18/2022).   Assessment: Primary concerns today:   Pt arrives states she hasn't seen other RD since our previous visit and will see again on 8/5. Reports she will see endocrinologist on 7/2 and will have tooth pulled on 7/23.  States her FBS numbers have increased 156-331 mg/dL since our previous visit. Pt takes pictures of her food which helps to see what is happening on certain days with food. Pt reports there were 2 days that she ate 1-2 packs of Oreo's and the following morning her FBS was 331 mg/dL and 454 mg/dL respectively. States she is in charge of taking Oreo's to fellowship gatherings with her church and they have leftovers and she brings them home. Reports she eats the entire package or packages to hide the evidence from her mom and family.   Previous visit:  States she is drinking 3*33 oz (99 oz) bottles of water a day. States she is back on Ozempic 1 mg; dosing on Sundays. States her recent A1c decreased from 11.4% to 9.0% (03/2024). Recent FBS (119-272 mg/dL). Pt states she has increased physical activity from 2 days/week due to 3 days/week.  States she doesn't check consistently 2 hours after meals; unaware of how much time passes. Recent labs from 08/2023: Elevated A1c 11.4% Elevated Glu 319 Low Creatinine 0.44  Low Na 132 Low Cl 97 Elevated UMA Elevated MA/CR ratio 61.6 Elevated Fructosamine 362 Lab work reports iron deficiency anemia.  States she has had medication changes; now taking Tresiba and Ozempic. States she follows-up in 2 months, 12/2022. Additional lab results from 02/2022 are: Urine microalbumin 5.75H Urine MA/CR Ratio 134.3H Glu 283H Chol 143 Trig 135 HDLD 34 LDL Chol 84 eGFR 126  Diagnosed with diabetes at least 10 years ago. 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 States she can sense satisfaction when eating.   Pt states she likes butternut squash, cauliflower (faux potatoes), broccoli with cheese or ranch, green beans, peas, beets as vegetable options. Pt loves veggie tots.     Preferred Learning Style:  No preference indicated   Learning Readiness:  Ready Change in progress   MEDICATIONS: See list   DIETARY INTAKE:  Usual eating pattern includes 3 meals and 2 snacks per day.  Everyday foods include yogurt, nuts, berries, veggie tots, fried chicken, green beans, steamed broccoli, snow peas. Avoided foods include some vegetables.    24-hr recall: did  B (8 AM): Glucerna + 1 waffle + sugar free syrup + trail mix (pretzel bites, almonds, grapes) + cottage cheese 1 TBS peanut butter or cereal + trail mix + almonds + grapes + cookies  Snk (11 AM):  L (12-1 PM): salad bowl + fruit + veggie sticks + fig newton  Snk (2 PM): D (6-7 PM): salad bowl + greek yogurt mixed with Grain berry cereal + grapes + PB Snk (PM): veggie sticks  Beverages: water (99 oz), coffee (20 oz), diet soda (occasionally); 119+ oz  Usual physical activity: seated elliptical (level 3) 60 min, 3x/week at gym  Estimated energy needs: 2000-2200 calories 225-248 g carbohydrates 150-165 g protein 56-61 g fat  Progress Towards Goal(s):  Some progress.  Nutritional Diagnosis:  NB-1.5 Disordered eating pattern As related to balanced meals.  As evidenced by pt reports indulging.    Intervention:  Mainly listened. Pt says she could leave Oreo's at church and I agreed to help prevent eating the package. Discussed ways to have enjoyable items in moderation without greatly impacting blood glucose numbers. Nutrition education and counseling. Encouraged pt with changes made since previous visit. Pt is doing well! Pt agreed with goals. Goals: - Leave Oreo's  at church and take home about 6 cookies instead of the entire pack.   Teaching Method Utilized:  Visual Auditory Hands on  Handouts given during visit include: none  Barriers to learning/adherence to lifestyle change: none identified  Demonstrated degree of understanding via:  Teach Back   Monitoring/Evaluation:  Dietary intake, exercise, and body weight in 2 month(s).

## 2024-05-15 NOTE — Patient Instructions (Signed)
-   Leave Oreo's at church and take home about 6 cookies instead of the entire pack.

## 2024-05-23 ENCOUNTER — Ambulatory Visit: Admitting: Podiatry

## 2024-05-30 ENCOUNTER — Ambulatory Visit: Admitting: Podiatry

## 2024-05-30 DIAGNOSIS — M79609 Pain in unspecified limb: Secondary | ICD-10-CM

## 2024-05-30 DIAGNOSIS — B351 Tinea unguium: Secondary | ICD-10-CM

## 2024-05-30 DIAGNOSIS — G809 Cerebral palsy, unspecified: Secondary | ICD-10-CM

## 2024-05-30 DIAGNOSIS — E119 Type 2 diabetes mellitus without complications: Secondary | ICD-10-CM

## 2024-05-30 NOTE — Progress Notes (Signed)
 This patient returns to my office for at risk foot care.  This patient requires this care by a professional since this patient will be at risk due to having diabetes.  This patient is unable to cut nails herself since the patient cannot reach her nails.These nails are painful walking and wearing shoes.  This patient presents for at risk foot care today.    General Appearance  Alert, conversant and in no acute stress.  Vascular  Dorsalis pedis and posterior tibial  pulses are weakly  palpable  due to swelling  bilaterally.  Capillary return is within normal limits  bilaterally. Temperature is within normal limits  Bilaterally. Swelling feet/legs  B/L.  Neurologic  Senn-Weinstein monofilament wire test within normal limits  bilaterally. Muscle power within normal limits bilaterally.  Nails Thick disfigured discolored nails with subungual debris  from hallux to fifth toes bilaterally. No evidence of bacterial infection or drainage bilaterally.  Orthopedic  No limitations of motion  feet .  No crepitus or effusions noted.  No bony pathology or digital deformities noted.  Skin  normotropic skin with no porokeratosis noted bilaterally.  No signs of infections or ulcers noted.   Red swollen feet  B/L.  Onychomycosis  Pain in right toes  Pain in left toes  Consent was obtained for treatment procedures.   Mechanical debridement of nails 1-5  bilaterally performed with a nail nipper.  Patient requests no dremel tool usage.   Return office visit 12  weeks                  Told patient to return for periodic foot care and evaluation due to potential at risk complications.   Helane Gunther DPM

## 2024-06-05 DIAGNOSIS — E559 Vitamin D deficiency, unspecified: Secondary | ICD-10-CM | POA: Diagnosis not present

## 2024-06-05 DIAGNOSIS — D500 Iron deficiency anemia secondary to blood loss (chronic): Secondary | ICD-10-CM | POA: Diagnosis not present

## 2024-06-05 DIAGNOSIS — E1165 Type 2 diabetes mellitus with hyperglycemia: Secondary | ICD-10-CM | POA: Diagnosis not present

## 2024-06-05 DIAGNOSIS — G809 Cerebral palsy, unspecified: Secondary | ICD-10-CM | POA: Diagnosis not present

## 2024-06-05 DIAGNOSIS — E7800 Pure hypercholesterolemia, unspecified: Secondary | ICD-10-CM | POA: Diagnosis not present

## 2024-06-05 DIAGNOSIS — R809 Proteinuria, unspecified: Secondary | ICD-10-CM | POA: Diagnosis not present

## 2024-07-04 DIAGNOSIS — E1121 Type 2 diabetes mellitus with diabetic nephropathy: Secondary | ICD-10-CM | POA: Diagnosis not present

## 2024-07-04 DIAGNOSIS — E7800 Pure hypercholesterolemia, unspecified: Secondary | ICD-10-CM | POA: Diagnosis not present

## 2024-07-04 DIAGNOSIS — E1165 Type 2 diabetes mellitus with hyperglycemia: Secondary | ICD-10-CM | POA: Diagnosis not present

## 2024-07-10 ENCOUNTER — Encounter: Payer: Self-pay | Admitting: Registered"

## 2024-07-10 ENCOUNTER — Encounter: Attending: Internal Medicine | Admitting: Registered"

## 2024-07-10 DIAGNOSIS — F50819 Binge eating disorder, unspecified: Secondary | ICD-10-CM | POA: Diagnosis not present

## 2024-07-10 DIAGNOSIS — E119 Type 2 diabetes mellitus without complications: Secondary | ICD-10-CM | POA: Diagnosis not present

## 2024-07-10 NOTE — Progress Notes (Signed)
 Medical Nutrition Therapy:   Appt start time: 2:02  Appt end time: 2:54  *Pt states she would like for me to connect with new RD for collaboration of care; signed ROI (01/18/2022).   Assessment: Primary concerns today:   Pt arrives stating she has to have a tooth pulled in 08/2024.   States she has now added on a 4th day at the [facility] for exercise. States she is now exercising 4 days/week, 60 minutes each session of the seated elliptical.   Reports recent A1c was 8.5% (06/2024) a decrease from 9.0% (03/2024). Recent FBS numbers have been 170-299 mg/dL. Reports a few low BS numbers such as 37, 75, and 83 mg/dL.   Previous visit:  States she is drinking 3*33 oz (99 oz) bottles of water a day. States she is back on Ozempic 1 mg; dosing on Sundays. States her recent A1c decreased from 11.4% to 9.0% (03/2024). Recent FBS (119-272 mg/dL). Pt states she has increased physical activity from 2 days/week due to 3 days/week.  States she doesn't check consistently 2 hours after meals; unaware of how much time passes. Recent labs from 08/2023: Elevated A1c 11.4% Elevated Glu 319 Low Creatinine 0.44  Low Na 132 Low Cl 97 Elevated UMA Elevated MA/CR ratio 61.6 Elevated Fructosamine 362 Lab work reports iron deficiency anemia.  States she has had medication changes; now taking Tresiba and Ozempic. States she follows-up in 2 months, 12/2022. Additional lab results from 02/2022 are: Urine microalbumin 5.75H Urine MA/CR Ratio 134.3H Glu 283H Chol 143 Trig 135 HDLD 34 LDL Chol 84 eGFR 126  Diagnosed with diabetes at least 10 years ago. 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 States she can sense satisfaction when eating.   Pt states she likes butternut squash, cauliflower (faux potatoes), broccoli with cheese or ranch, green beans, peas, beets as vegetable options. Pt  loves veggie tots.     Preferred Learning Style:  No preference indicated   Learning Readiness:  Ready Change in progress   MEDICATIONS: See list   DIETARY INTAKE:  Usual eating pattern includes 3 meals and 2 snacks per day.  Everyday foods include yogurt, nuts, berries, veggie tots, fried chicken, green beans, steamed broccoli, snow peas. Avoided foods include some vegetables.    24-hr recall: did  B (8 AM): Glucerna + 1 waffle + sugar free syrup + trail mix (pretzel bites, almonds, grapes) + cottage cheese 1 TBS peanut butter or cereal + trail mix + almonds + grapes + cookies  Snk (11 AM):  L (12-1 PM): salad bowl + fruit + veggie sticks + fig newton  Snk (2 PM): D (6-7 PM): salad bowl + greek yogurt mixed with Grain berry cereal + grapes + PB Snk (PM): veggie sticks  Beverages: water (99 oz), coffee (20 oz), diet soda (occasionally); 119+ oz  Usual physical activity: seated elliptical (level 3) 60 min, 4x/week at gym  Estimated energy needs: 2000-2200 calories 225-248 g carbohydrates 150-165 g protein 56-61 g fat  Progress Towards Goal(s):  Some progress.   Nutritional Diagnosis:  NB-1.5 Disordered eating pattern As related to balanced meals.  As evidenced by pt reports indulging.    Intervention:  Mainly listened. Encouraged pt with changes made from previous visit and discussed correlation between changes made and blood sugar/A1c improvements. Educated and discussed hypoglycemia. Pt is doing well!  Pt agreed with goals. Goals: - When fasting blood glucose is less than 70 mg/dL, consume 4 glucose tablets and check blood glucose again 15 minutes later. Make sure blood glucose is at least 80 mg/dL.   Teaching Method Utilized:  Visual Auditory Hands on  Handouts given during visit include: none  Barriers to learning/adherence to lifestyle change: none identified  Demonstrated degree of understanding via:  Teach Back   Monitoring/Evaluation:  Dietary intake,  exercise, and body weight in 1 month(s).

## 2024-07-10 NOTE — Patient Instructions (Signed)
-   When fasting blood glucose is less than 70 mg/dL, consume 4 glucose tablets and check blood glucose again 15 minutes later. Make sure blood glucose is at least 80 mg/dL.

## 2024-07-17 DIAGNOSIS — N920 Excessive and frequent menstruation with regular cycle: Secondary | ICD-10-CM | POA: Diagnosis not present

## 2024-07-29 DIAGNOSIS — Z1231 Encounter for screening mammogram for malignant neoplasm of breast: Secondary | ICD-10-CM | POA: Diagnosis not present

## 2024-08-04 DIAGNOSIS — E1121 Type 2 diabetes mellitus with diabetic nephropathy: Secondary | ICD-10-CM | POA: Diagnosis not present

## 2024-08-04 DIAGNOSIS — E7800 Pure hypercholesterolemia, unspecified: Secondary | ICD-10-CM | POA: Diagnosis not present

## 2024-08-04 DIAGNOSIS — E1165 Type 2 diabetes mellitus with hyperglycemia: Secondary | ICD-10-CM | POA: Diagnosis not present

## 2024-08-07 ENCOUNTER — Encounter: Attending: Internal Medicine | Admitting: Registered"

## 2024-08-07 DIAGNOSIS — E119 Type 2 diabetes mellitus without complications: Secondary | ICD-10-CM | POA: Diagnosis not present

## 2024-08-07 DIAGNOSIS — F50819 Binge eating disorder, unspecified: Secondary | ICD-10-CM | POA: Diagnosis not present

## 2024-08-07 NOTE — Patient Instructions (Addendum)
-   Decrease cookie intake to one meal/day.

## 2024-08-07 NOTE — Progress Notes (Signed)
 Medical Nutrition Therapy:   Appt start time: 2:02  Appt end time: 2:47  *Pt states she would like for me to connect with new RD for collaboration of care; signed ROI (01/18/2022).   Assessment: Primary concerns today:   States she went to [employer] earlier this week for grocery shopping and medications with Mom. States they also went to breakfast afterwards at [employer]. Reports feeling full most of the day after that breakfast (eggs, bacon/sausage, and potatoes). States she gave away the biscuit.   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 States she will have tooth pulled next week.   Recent FBS numbers have been 125-321 mg/dL. Per photos of food intake, pt has been adding cookies to meals multiple times a day.   Previous visit:  Reports recent A1c was 8.5% (06/2024) a decrease from 9.0% (03/2024). States she is drinking 3*33 oz (99 oz) bottles of water a day. States she is back on Ozempic 1 mg; dosing on Sundays. States her recent A1c decreased from 11.4% to 9.0% (03/2024). Recent FBS (119-272 mg/dL). Pt states she has increased physical activity from 2 days/week due to 3 days/week.  States she doesn't check consistently 2 hours after meals; unaware of how much time passes. Recent labs from 08/2023: Elevated A1c 11.4% Elevated Glu 319 Low Creatinine 0.44  Low Na 132 Low Cl 97 Elevated UMA Elevated MA/CR ratio 61.6 Elevated Fructosamine 362 Lab work reports iron deficiency anemia.  States she has had medication changes; now taking Tresiba and Ozempic. States she follows-up in 2 months, 12/2022. Additional lab results from 02/2022 are: Urine microalbumin 5.75H Urine MA/CR Ratio 134.3H Glu 283H Chol 143 Trig 135 HDLD 34 LDL Chol 84 eGFR 126  Diagnosed with diabetes at least 10 years ago. 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 States it damaged the portion of her brain  that controls appetite. States she can sense satisfaction when eating.   Pt states she likes butternut squash, cauliflower (faux potatoes), broccoli with cheese or ranch, green beans, peas, beets as vegetable options. Pt loves veggie tots.     Preferred Learning Style:  No preference indicated   Learning Readiness:  Ready Change in progress   MEDICATIONS: See list   DIETARY INTAKE:  Usual eating pattern includes 3 meals and 2 snacks per day.  Everyday foods include yogurt, nuts, berries, veggie tots, fried chicken, green beans, steamed broccoli, snow peas. Avoided foods include some vegetables.    24-hr recall: did  B (8 AM): Glucerna + 1 waffle + sugar free syrup + trail mix (pretzel bites, almonds, grapes) + cottage cheese + 1 TBS peanut butter + cookies or cereal + trail mix + almonds + grapes + cookies  Snk (11 AM):  L (12-1 PM): salad bowl + fruit + veggie sticks + cookies  Snk (2 PM): D (6-7 PM): salad bowl + greek yogurt mixed with Grain berry cereal + grapes + PB + cookies Snk (PM):   Beverages: water (99 oz), coffee (20 oz), diet soda (occasionally); 119+ oz  Usual physical activity: seated elliptical (level 5) 60 min, 4x/week at gym  Estimated energy needs: 2000-2200 calories 225-248 g carbohydrates 150-165 g protein 56-61 g fat  Progress Towards Goal(s):  Some progress.   Nutritional Diagnosis:  NB-1.5 Disordered eating pattern As related to balanced meals.  As evidenced by pt reports  indulging.    Intervention:  Mainly listened. Discussed changes with cookie intake and reviewed hypoglycemic protocol with patient. Pt is doing well! Pt agreed with goals. Goals: - When fasting blood glucose is less than 70 mg/dL, consume 4 glucose tablets and check blood glucose again 15 minutes later. Make sure blood glucose is at least 80 mg/dL.  - Decrease cookie intake to one meal/day.   Teaching Method Utilized:  Visual Auditory Hands on  Handouts given during visit  include: none  Barriers to learning/adherence to lifestyle change: none identified  Demonstrated degree of understanding via:  Teach Back   Monitoring/Evaluation:  Dietary intake, exercise, and body weight in 1 month(s).

## 2024-08-29 ENCOUNTER — Ambulatory Visit (INDEPENDENT_AMBULATORY_CARE_PROVIDER_SITE_OTHER): Admitting: Podiatry

## 2024-08-29 ENCOUNTER — Encounter: Payer: Self-pay | Admitting: Podiatry

## 2024-08-29 DIAGNOSIS — M79609 Pain in unspecified limb: Secondary | ICD-10-CM

## 2024-08-29 DIAGNOSIS — E119 Type 2 diabetes mellitus without complications: Secondary | ICD-10-CM

## 2024-08-29 DIAGNOSIS — B351 Tinea unguium: Secondary | ICD-10-CM | POA: Diagnosis not present

## 2024-08-29 DIAGNOSIS — G809 Cerebral palsy, unspecified: Secondary | ICD-10-CM

## 2024-08-29 NOTE — Progress Notes (Signed)
 This patient returns to my office for at risk foot care.  This patient requires this care by a professional since this patient will be at risk due to having diabetes.  This patient is unable to cut nails herself since the patient cannot reach her nails.These nails are painful walking and wearing shoes.  This patient presents for at risk foot care today.    General Appearance  Alert, conversant and in no acute stress.  Vascular  Dorsalis pedis and posterior tibial  pulses are weakly  palpable  due to swelling  bilaterally.  Capillary return is within normal limits  bilaterally. Temperature is within normal limits  Bilaterally. Swelling feet/legs  B/L.  Neurologic  Senn-Weinstein monofilament wire test within normal limits  bilaterally. Muscle power within normal limits bilaterally.  Nails Thick disfigured discolored nails with subungual debris  from hallux to fifth toes bilaterally. No evidence of bacterial infection or drainage bilaterally.  Orthopedic  No limitations of motion  feet .  No crepitus or effusions noted.  No bony pathology or digital deformities noted.  Skin  normotropic skin with no porokeratosis noted bilaterally.  No signs of infections or ulcers noted.   Red swollen feet  B/L.  Onychomycosis  Pain in right toes  Pain in left toes  Consent was obtained for treatment procedures.   Mechanical debridement of nails 1-5  bilaterally performed with a nail nipper.  Patient requests no dremel tool usage.   Return office visit 12  weeks                  Told patient to return for periodic foot care and evaluation due to potential at risk complications.   Helane Gunther DPM

## 2024-09-03 DIAGNOSIS — E1121 Type 2 diabetes mellitus with diabetic nephropathy: Secondary | ICD-10-CM | POA: Diagnosis not present

## 2024-09-03 DIAGNOSIS — E7800 Pure hypercholesterolemia, unspecified: Secondary | ICD-10-CM | POA: Diagnosis not present

## 2024-09-03 DIAGNOSIS — E1165 Type 2 diabetes mellitus with hyperglycemia: Secondary | ICD-10-CM | POA: Diagnosis not present

## 2024-09-04 DIAGNOSIS — E1165 Type 2 diabetes mellitus with hyperglycemia: Secondary | ICD-10-CM | POA: Diagnosis not present

## 2024-09-04 DIAGNOSIS — I878 Other specified disorders of veins: Secondary | ICD-10-CM | POA: Diagnosis not present

## 2024-09-04 DIAGNOSIS — R809 Proteinuria, unspecified: Secondary | ICD-10-CM | POA: Diagnosis not present

## 2024-09-04 DIAGNOSIS — N920 Excessive and frequent menstruation with regular cycle: Secondary | ICD-10-CM | POA: Diagnosis not present

## 2024-09-04 DIAGNOSIS — D500 Iron deficiency anemia secondary to blood loss (chronic): Secondary | ICD-10-CM | POA: Diagnosis not present

## 2024-09-04 DIAGNOSIS — E7800 Pure hypercholesterolemia, unspecified: Secondary | ICD-10-CM | POA: Diagnosis not present

## 2024-09-04 DIAGNOSIS — E559 Vitamin D deficiency, unspecified: Secondary | ICD-10-CM | POA: Diagnosis not present

## 2024-09-11 ENCOUNTER — Ambulatory Visit: Admitting: Registered"

## 2024-09-14 DIAGNOSIS — L03115 Cellulitis of right lower limb: Secondary | ICD-10-CM | POA: Diagnosis not present

## 2024-09-14 DIAGNOSIS — J019 Acute sinusitis, unspecified: Secondary | ICD-10-CM | POA: Diagnosis not present

## 2024-09-14 DIAGNOSIS — J069 Acute upper respiratory infection, unspecified: Secondary | ICD-10-CM | POA: Diagnosis not present

## 2024-09-16 DIAGNOSIS — L03115 Cellulitis of right lower limb: Secondary | ICD-10-CM | POA: Diagnosis not present

## 2024-09-17 DIAGNOSIS — D500 Iron deficiency anemia secondary to blood loss (chronic): Secondary | ICD-10-CM | POA: Diagnosis not present

## 2024-09-17 DIAGNOSIS — E7800 Pure hypercholesterolemia, unspecified: Secondary | ICD-10-CM | POA: Diagnosis not present

## 2024-09-17 DIAGNOSIS — E1165 Type 2 diabetes mellitus with hyperglycemia: Secondary | ICD-10-CM | POA: Diagnosis not present

## 2024-09-17 DIAGNOSIS — Z6841 Body Mass Index (BMI) 40.0 and over, adult: Secondary | ICD-10-CM | POA: Diagnosis not present

## 2024-10-01 DIAGNOSIS — M7989 Other specified soft tissue disorders: Secondary | ICD-10-CM | POA: Diagnosis not present

## 2024-10-01 DIAGNOSIS — B351 Tinea unguium: Secondary | ICD-10-CM | POA: Diagnosis not present

## 2024-10-01 DIAGNOSIS — I890 Lymphedema, not elsewhere classified: Secondary | ICD-10-CM | POA: Diagnosis not present

## 2024-10-01 DIAGNOSIS — I872 Venous insufficiency (chronic) (peripheral): Secondary | ICD-10-CM | POA: Diagnosis not present

## 2024-10-01 DIAGNOSIS — E1121 Type 2 diabetes mellitus with diabetic nephropathy: Secondary | ICD-10-CM | POA: Diagnosis not present

## 2024-10-03 ENCOUNTER — Ambulatory Visit (HOSPITAL_COMMUNITY)
Admission: RE | Admit: 2024-10-03 | Discharge: 2024-10-03 | Disposition: A | Discharge status: Home / Self Care | Source: Ambulatory Visit | Attending: Vascular Surgery | Admitting: Vascular Surgery

## 2024-10-03 ENCOUNTER — Other Ambulatory Visit (HOSPITAL_COMMUNITY): Payer: Self-pay | Admitting: Internal Medicine

## 2024-10-03 DIAGNOSIS — R609 Edema, unspecified: Secondary | ICD-10-CM | POA: Diagnosis not present

## 2024-10-04 DIAGNOSIS — E1121 Type 2 diabetes mellitus with diabetic nephropathy: Secondary | ICD-10-CM | POA: Diagnosis not present

## 2024-10-04 DIAGNOSIS — E7800 Pure hypercholesterolemia, unspecified: Secondary | ICD-10-CM | POA: Diagnosis not present

## 2024-10-04 DIAGNOSIS — E1165 Type 2 diabetes mellitus with hyperglycemia: Secondary | ICD-10-CM | POA: Diagnosis not present

## 2024-10-09 ENCOUNTER — Ambulatory Visit: Admitting: Registered"

## 2024-10-14 DIAGNOSIS — E1165 Type 2 diabetes mellitus with hyperglycemia: Secondary | ICD-10-CM | POA: Diagnosis not present

## 2024-10-14 DIAGNOSIS — N920 Excessive and frequent menstruation with regular cycle: Secondary | ICD-10-CM | POA: Diagnosis not present

## 2024-10-14 DIAGNOSIS — R809 Proteinuria, unspecified: Secondary | ICD-10-CM | POA: Diagnosis not present

## 2024-10-14 DIAGNOSIS — E1121 Type 2 diabetes mellitus with diabetic nephropathy: Secondary | ICD-10-CM | POA: Diagnosis not present

## 2024-10-14 DIAGNOSIS — I890 Lymphedema, not elsewhere classified: Secondary | ICD-10-CM | POA: Diagnosis not present

## 2024-10-14 DIAGNOSIS — I872 Venous insufficiency (chronic) (peripheral): Secondary | ICD-10-CM | POA: Diagnosis not present

## 2024-10-14 DIAGNOSIS — B351 Tinea unguium: Secondary | ICD-10-CM | POA: Diagnosis not present

## 2024-10-14 DIAGNOSIS — G809 Cerebral palsy, unspecified: Secondary | ICD-10-CM | POA: Diagnosis not present

## 2024-10-22 DIAGNOSIS — K219 Gastro-esophageal reflux disease without esophagitis: Secondary | ICD-10-CM | POA: Diagnosis not present

## 2024-10-22 DIAGNOSIS — E1165 Type 2 diabetes mellitus with hyperglycemia: Secondary | ICD-10-CM | POA: Diagnosis not present

## 2024-10-22 DIAGNOSIS — R809 Proteinuria, unspecified: Secondary | ICD-10-CM | POA: Diagnosis not present

## 2024-10-22 DIAGNOSIS — E7800 Pure hypercholesterolemia, unspecified: Secondary | ICD-10-CM | POA: Diagnosis not present

## 2024-10-22 DIAGNOSIS — D500 Iron deficiency anemia secondary to blood loss (chronic): Secondary | ICD-10-CM | POA: Diagnosis not present

## 2024-10-28 ENCOUNTER — Encounter: Payer: Self-pay | Admitting: Occupational Therapy

## 2024-10-28 ENCOUNTER — Ambulatory Visit: Attending: Internal Medicine | Admitting: Occupational Therapy

## 2024-10-28 DIAGNOSIS — I890 Lymphedema, not elsewhere classified: Secondary | ICD-10-CM | POA: Insufficient documentation

## 2024-10-28 NOTE — Therapy (Unsigned)
 OUTPATIENT OCCUPATIONAL THERAPY EVALUATION  RIGHT LOWER EXTREMITY/  RIGHT LOWER QUADRANT  LYMPHEDEMA  Patient Name: TAMEA BAI MRN: [id number] DOB:03-12-1986, 38 y.o., female Today's Date: 10/29/2024  END OF SESSION:  OT End of Session - 10/28/24 1510     Visit Number 1    Number of Visits 36    Date for Recertification  01/26/25    OT Start Time 0310    OT Stop Time 0410    OT Time Calculation (min) 60 min    Activity Tolerance Patient tolerated treatment well;No increased pain;Other (comment)   Pt limited by CP   Behavior During Therapy [facility] for tasks assessed/performed            Past Medical History:  Diagnosis Date   Diabetes mellitus without complication (HCC)    History reviewed. No pertinent surgical history. Patient Active Problem List   Diagnosis Date Noted   Allergic rhinitis 02/23/2022   Cerebral palsy (HCC) 02/23/2022   Chronic kidney disease, stage 1 02/23/2022   Diabetic renal disease (HCC) 02/23/2022   Gastro-esophageal reflux disease without esophagitis 02/23/2022   Hemiplegia of dominant side (HCC) 02/23/2022   Morbid obesity (HCC) 02/23/2022   Pure hypercholesterolemia 02/23/2022   Type 2 diabetes mellitus with diabetic nephropathy (HCC) 02/23/2022   Uncontrolled type 2 diabetes mellitus with hyperglycemia (HCC) 08/14/2018    PCP: Velna Barrow, MD  REFERRING PROVIDER: same  REFERRING DIAG: I89.0  THERAPY DIAG:  Lymphedema, not elsewhere classified  Rationale for Evaluation and Treatment: Rehabilitation  ONSET DATE: several years ago. No known precipitating event  SUBJECTIVE:                                                                                                                                                                                           SUBJECTIVE STATEMENT: 10/28/24 Initial OT Eval: [name] is referred to Occupational Therapy by [name], MD, for evaluation and treatment of BLE lymphedema. Pt. Is  accompanied by her mother, [name]. Pt ambulates to clinic with ataxic gait using extra time. She reports the pain, redness, and swelling in her right leg and foot worsened a lot during a recent first-ever episode of RLE cellulitis,  which required oral antibiotics Keflex and doxycycline. Since the cellulitis ~ 2 months ago Pt applies skin lotion ( brand unknown) 2 x daily, and applies a large gob of Vaseline at night with clean calf-length socks on top. Pt denies lymphorrhea. Pt states she has had leg swelling for a long time. She does not know exactly when it started.   PERTINENT HISTORY:  Chronic venous stasis dermatitis,  10/03/24 BLE Doppler  ruled out DVT Uncontrolled DM Type 2 CP w R hemiplegia Myopia Diabetic renal disease Obesity (BMI 43.1) BLE Lymphedema  PAIN:  Are you having pain? Yes, unable to rate numerically; heavy, itchy, aching Decreases: unknown Increases: standing, walking, sitting  PRECAUTIONS: Fall and Other: LYMPHEDEMA: skin precautions 2/2 DM  RED FLAGS: None   WEIGHT BEARING RESTRICTIONS: No  FALLS:  Has patient fallen in last 6 months? TBA  LIVING ENVIRONMENT: Lives with: lives alone Lives in: House/apartment Stairs: TBA Has following equipment at home: None  OCCUPATION: Works part time at [employer]- requires standing and walking  EDUCATION: HS  LEISURE: friends and family time  HAND DOMINANCE: left   PRIOR LEVEL OF FUNCTION: extra time for functional ambulation and transfers, needs AD for self care  PATIENT GOALS: get swelling down and limit recurrence of cellulitis  OBJECTIVE: Moderate, BLE, Stage  II Lymphedema 2/2 suspected CVI and Obesity, R>L  Note: Objective measures were completed at Evaluation unless otherwise noted.  COGNITION:  Overall cognitive status: difficult to assess. Some difficulty with medical hx. Asks excellent questions. Fully participates in eval with appropriate affect. Processing speed slightly  delayed.  OBSERVATIONS / OTHER ASSESSMENTS:  POSTURE: R hemiplegia; random, uncontrolled, jerky movements of R upper extremity, head and trunk  LE ROM: N/T due to spasticity.  Limited ability bend to over to reach feet and distal legs with hands   LYMPHEDEMA ASSESSMENTS:   SURGERY TYPE/DATE: N/A  NUMBER OF LYMPH NODES REMOVED: N/A  CHEMOTHERAPY: N/A  RADIATION:N/A  HORMONE TREATMENT: N/A  INFECTIONS: s/p R leg / foot cellulitis . Rx with oral Keflex and doxycycline   BLE COMPARATIVE LIMB VOLUMETRICS TBA OT Rx 1  LANDMARK RIGHT   R LEG (A-D) N/A  R THIGH (E-G) ml  R FULL LIMB (A-G) ml  Limb Volume differential (LVD)  %  Volume change since initial %  Volume change overall V  (Blank rows = not tested)  LANDMARK LEFT    L LEG (A-D) N/A  L THIGH (E-G) ml  L FULL LIMB (A-G) ml  Limb Volume differential (LVD)  %  Volume change since initial %  Volume change overall %  (Blank rows = not tested)    SKIN CONDITION/TISSUE INTEGRITY:  Skin  Description Hyper- Keratosis Peau d d'Orange Shiny Tight Fibrotic/ Indurated Fatty Doughy Spongy/ boggy    Fibrotic skin thickening x  x x       Skin dry Flaky WNL Macerated Crusty Exudate   x x   Dry, cracked and scaly with yellowish discharge crusty plaque   Color Redness Varicosities Blanching Hemosiderin Stain Mottled   x  x   x   Odor Malodorous Yeast Fungal infection  WNL      x   Temperature Warm Cool wnl   x      Pitting Edema   1+ 2+ 3+ 4+ Non-pitting         x   Girth Symmetrical Asymmetrical                   Distribution    R>L toes to groin on R; toes to popliteal on L    Stemmer Sign Positive Negative   Strong +    Lymphorrhea History Of:  Present Absent   x      Wounds History Of Present Absent Venous Arterial Pressure Sheer   Unknown  x        Signs of Infection Redness Warmth Erythema Acute  Swelling Drainage Borders  x x x Fluctuating x             Sensation Light Touch Deep  pressure Hypersensitivity   In tact Impaired In tact Impaired Absent Impaired   Difficult to assess    x     Nails WNL   Fungus nail dystrophy     Severe, discolored    Hair Growth Symmetrical Asymmetrical   Difficult to assess    Skin Creases Base of toes  Ankles   Base of Fingers knees       Abdominal pannus Thigh Lobules  Face/neck   x x  x suspected      GAIT: Distance walked: ataxic Assistive device utilized: None Level of assistance: Modified independence Comments: achilles sx as a child  LYMPHEDEMA LIFE IMPACT SCALE (LLIS): TBA initial Rx visit (The extent to which LE-related problems interfered with your life  over the last week)                                                                                                                            TREATMENT this DATE:  OT evaluation Pt and family edu   PATIENT EDUCATION:  Discussed differential diagnoses for various swelling disorders. Provided basic level education regarding lymphatic structure and function, etiology, onset patterns, stages of progression, and prevention to limit infection risk, worsening condition and further functional decline. Pt edu for aught interaction between blood circulatory system and lymphatic circulation.Discussed  impact of gravity and co-morbidities on lymphatic function. Outlined Complete Decongestive Therapy (CDT)  as standard of care and provided in depth information regarding 4 primary components of Intensive and Self Management Phases, including Manual Lymph Drainage (MLD), compression wrapping and garments, skin care, and therapeutic exercise. Dempsey discussion with re need for frequent attendance and high burden of care when caregiver is needed, impact of co morbidities. We discussed  the chronic, progressive nature of lymphedema and Importance of daily, ongoing LE self-care essential for limiting progression and infection risk.  Person educated: Patient  Education method:  Explanation, Demonstration, and Handouts Education comprehension: verbalized understanding, returned demonstration, verbal cues required, and needs further education  LYMPHEDEMA SELF-CARE HOME PROGRAM: BLE lymphatic pumping there ex- 1 set of 10 each element, in order. Hold 5. 2 x daily  Daily, short stretch, thigh or knee length, multilayer compression bandages to one leg at a time during Intensive Phase CDT   During self-management Phase of CDT fit Pt with BLE custom compression garments and HOS devices bilaterally to control swelling. Custom-made gradient compression garments and HOS devices are medically necessary because they are uniquely sized and shaped to fit the exact dimensions of the affected extremities, and to provide appropriate medical grade, graduated compression essential for optimally managing chronic, progressive lymphedema. Multiple custom compression garments are needed to ensure proper hygiene to limit infection risk. Custom compression garments should be replaced q 3-6 months When worn consistently for optimal lipo-lymphedema self-management over time. HOS devices, medically necessary to  limit fibrosis buildup in tissue, should be replaced q 2 years and PRN when worn out.    4. Consider trial of advanced, sequential, pneumatic , compression device, Flexitouch, for optimal LE self management of LE at home over time.  5. Daily simple self MLD used in conjunction with pneumatic device to engage thoracic duct return and to stimulate the terminus LNs in subclavian region.  6. Daily skin care with low ph lotion matching skin ph to reduce infection risk.  7. Leg elevation when seated    ASSESSMENT:  CLINICAL IMPRESSION: ALEANNA MENGE is a 38 yo female presenting with moderate, stage II, BLE/BLQ lymphedema secondary to suspected venous insufficiency, obesity and trauma resulting from cellulitis. Chronic, progressive limb swelling and associated pain is distributed from toes to  groin on the R and below the knee on the left. Lymphedema occurs secondary to trauma when the injury damages lymphatic vessels and structures in the area, leading to a buildup of protein-rich, extra-cellular fluid carrying cellular debris, waste products, bacteria, viruses, and damaged or abnormal cells in the tissues causing swelling, inflammation and pain in the affected body part. Secondary lymphedema arising from significant soft tissue trauma, like lymphedema originating from other issues, is known to delay wound healing and contribute to infection risk, as well as balance problems. Thus, unchecked lymphedema significantly impacts Pt's with comorbid diabetes and it's complications.    Chronic, progressive lymphedema contributes to functional limitans in all occupational domains, including functional ambulation and mobility, basic and instrumental ADLs, productive activities, leisure pursuits, and social participation. Deformity related to chronic swelling and tissue changes also has a negative impact on body image, self esteem and quality of life. BLE lymphedema contributes to elevated infection risk and increased fall risk.  Ms. Ruperto will benefit from skilled OT Complete Decongestive Therapy (CDT) the gold standard of lymphedema care, typically includes manual lymphatic drainage (MLD), skin care to limit' infection risk and increase skin excursion, lymphatic pumping exercise, and during the Intensive Phase multilayer, gradient compression bandaging to reduce limb volume. The goal of Intensive Phase CDT is to reduce swelling, reduce infection risk, limit progression,  and master  lymphedema self-care. As Pt transitions to the Self Management Phase of CDT Pt will be fit with appropriate compression garments and/ or devices and will complete a trial of the advanced, sequential, pneumatic, Flexitouch compression device for optimal, long term lymphedema management at home. Without skilled OT for lymphedema  care, Pt's condition will progress, and further functional decline is expected.  REHAB POTENTIAL: Pt's prognosis for reducing limb volumes and limiting progression of chronic, progressive lymphedema is poor without daily caregiver assistance with compression during the Intensive Phase of CDT. Lymphedema treatment has a high burden of care. Due to spasticity, limited hip AROM, and impaired hand function Ms. Daniele requires daily assistance with application of multilayer, short stretch, knee length, compression bandages  to reduce limb volumes.   With daily caregiver assistance during the Intensive Phase of CDT Pt's prognosis for improvement in her condition is good. With modification and ADL training she may be able to don/ doff and gauge alternative , Velcro,  wrap style , compression leggings .    OBJECTIVE IMPAIRMENTS: Abnormal gait, decreased balance, decreased coordination, decreased knowledge of condition, decreased knowledge of use of DME, decreased mobility, difficulty walking, decreased ROM, increased edema, increased fascial restrictions, increased muscle spasms, impaired flexibility, impaired sensation, impaired tone, impaired UE functional use, impaired vision/preception, postural dysfunction, obesity, pain, and chronic BLE swelling with permanent  skin changes and related pain.   ACTIVITY LIMITATIONS: Functional ambulation and mobility ( spasticity and limb heaviness 2/2 lymphedema limits ability to walk, perform transfers, climb curbs and stairs, carry, squat) basic and instrumental ADLS ( fit she's and lower body clothing, bathe lower body, stand and walk to perform self care and home management activities , productive activities at work, leisure pursuits and social participation in the community.    PERSONAL FACTORS: Age, Fitness, Past/current experiences, 3+ comorbidities: CP, obesity, diabetes, and limited caregiver support are also affecting patient's functional outcome. Lymphedema has a  high burden of self-care.  REHAB POTENTIAL: Pt's prognosis for reducing limb volumes and limiting progression of chronic, progressive lymphedema is poor without daily caregiver assistance with compression during the Intensive Phase of CDT. Lymphedema treatment has a high burden of care. Due to spasticity, limited hip AROM, and impaired hand function Ms. Benedict requires daily assistance with application of multilayer, short stretch, knee length, compression bandages  to reduce limb volumes.   With daily caregiver assistance during the Intensive Phase of CDT Pt's prognosis for improvement in her condition is good. With modification and ADL training she may be able to don/ doff and gauge alternative , Velcro,  wrap style , compression leggings .    EVALUATION COMPLEXITY: Moderate   SHORT TERM GOALS: Target date: 4th OT Rx visit   Pt will demonstrate understanding of lymphedema precautions and prevention strategies with moderate assistance using a printed reference to identify at least 5 precautions and discussing how s/he may implement them into daily life to reduce risk of progression with extra time. Baseline: Dependent Goal status: INITIAL  2.  Pt will be able to apply multilayer, thigh length, gradient, compression wraps to one leg at a time from toes to groin with max caregiver assist to decrease limb volume, to limit infection risk, and to limit lymphedema progression.  Baseline: Dependent Goal status: INITIAL  LONG TERM GOALS: Target date: 01/26/25  1.Given this patient's Intake score of TBA % on the Lymphedema Life Impact Scale (LLIS), patient will experience a reduction of at least 5 points in her perceived level of functional impairment resulting from lymphedema to improve functional performance and quality of life (QOL). Baseline: TBA % Goal status: INITIAL  2.  Pt will achieve at least a 10% volume reduction in BLE to return limbs to typical size and shape, to limit infection risk  and LE progression, to decrease pain, to improve function. Baseline: Dependent Goal status: INITIAL  3.  Pt will obtain appropriate compression garments/devices and achieve modified independence (extra time + assistive devices) with donning/doffing to optimize limb volume reductions and limit LE progression over time. Baseline: Dependent Goal status: INITIAL  4. During Intensive phase CDT, with modified independence, Pt will achieve at least 85% compliance with all adapted lymphedema self-care home program components, including daily skin care, compression wraps and /or garments, simple self MLD and lymphatic pumping therex to habituate LE self care protocol  into ADLs for optimal LE self-management over time. Baseline: Dependent Goal status: INITIAL  Pt will be able to don and doff Flexitouch advanced sequential compression device garments correctly and operate device according to therapist determined program within   2 weeks of receiving device and undergoing manufacturer's training.  Baseline: Dependent Goal status: INITIAL  PLAN:  OT FREQUENCY: 2x/week  OT DURATION: 12-18  weeks due to bilaterality and severity. We treat one limb at a time only to limit fall risk.  PLANNED INTERVENTIONS: Complete Decongestive Therapy (  CDT) to one leg a time , to include 97110-Therapeutic exercises, 97530- Therapeutic activity, 97535- Self Care, [id number]- Manual therapy, Patient/Family education, Manual lymph drainage, Compression bandaging and fitting with accessible, comfortable ,effective , day and nighttime compression garments, DME instructions, and skin care to reduce infection risk  PLAN FOR NEXT SESSION:  BLE comparative limb volumetrics Commence teaching caregiver to apply compression wraps Teach patient to direct a caregiver   Zebedee Dec, MS, OTR/L, CLT-LANA 10/29/24 2:15 PM

## 2024-10-29 ENCOUNTER — Encounter: Payer: Self-pay | Admitting: Occupational Therapy

## 2024-11-03 DIAGNOSIS — E1165 Type 2 diabetes mellitus with hyperglycemia: Secondary | ICD-10-CM | POA: Diagnosis not present

## 2024-11-03 DIAGNOSIS — E7800 Pure hypercholesterolemia, unspecified: Secondary | ICD-10-CM | POA: Diagnosis not present

## 2024-11-03 DIAGNOSIS — E1121 Type 2 diabetes mellitus with diabetic nephropathy: Secondary | ICD-10-CM | POA: Diagnosis not present

## 2024-11-04 ENCOUNTER — Ambulatory Visit: Attending: Internal Medicine | Admitting: Occupational Therapy

## 2024-11-04 DIAGNOSIS — I890 Lymphedema, not elsewhere classified: Secondary | ICD-10-CM | POA: Insufficient documentation

## 2024-11-05 DIAGNOSIS — T148XXA Other injury of unspecified body region, initial encounter: Secondary | ICD-10-CM | POA: Diagnosis not present

## 2024-11-05 DIAGNOSIS — E1165 Type 2 diabetes mellitus with hyperglycemia: Secondary | ICD-10-CM | POA: Diagnosis not present

## 2024-11-05 DIAGNOSIS — L03115 Cellulitis of right lower limb: Secondary | ICD-10-CM | POA: Diagnosis not present

## 2024-11-05 DIAGNOSIS — I872 Venous insufficiency (chronic) (peripheral): Secondary | ICD-10-CM | POA: Diagnosis not present

## 2024-11-05 DIAGNOSIS — I890 Lymphedema, not elsewhere classified: Secondary | ICD-10-CM | POA: Diagnosis not present

## 2024-11-05 NOTE — Therapy (Signed)
 OUTPATIENT OCCUPATIONAL THERAPY TREATMENT NOTE  RIGHT LOWER EXTREMITY/  RIGHT LOWER QUADRANT  LYMPHEDEMA  Patient Name: Danielle Aguilar MRN: [id number] DOB:[birth year], 38 y.o., female Today's Date: 11/05/2024  END OF SESSION:  OT End of Session - 11/04/24 1500     Visit Number 2    Number of Visits 36    Date for Recertification  01/26/25    OT Start Time 0205    OT Stop Time 0310    OT Time Calculation (min) 65 min    Activity Tolerance Patient tolerated treatment well;No increased pain;Other (comment)            Past Medical History:  Diagnosis Date   Diabetes mellitus without complication (HCC)    No past surgical history on file. Patient Active Problem List   Diagnosis Date Noted   Allergic rhinitis 02/23/2022   Cerebral palsy (HCC) 02/23/2022   Chronic kidney disease, stage 1 02/23/2022   Diabetic renal disease (HCC) 02/23/2022   Gastro-esophageal reflux disease without esophagitis 02/23/2022   Hemiplegia of dominant side (HCC) 02/23/2022   Morbid obesity (HCC) 02/23/2022   Pure hypercholesterolemia 02/23/2022   Type 2 diabetes mellitus with diabetic nephropathy (HCC) 02/23/2022   Uncontrolled type 2 diabetes mellitus with hyperglycemia (HCC) 08/14/2018    PCP: Velna Barrow, MD  REFERRING PROVIDER: same  REFERRING DIAG: I89.0  THERAPY DIAG:  Lymphedema, not elsewhere classified  Rationale for Evaluation and Treatment: Rehabilitation  ONSET DATE: several years ago. No known precipitating event  SUBJECTIVE:                                                                                                                                                                                           SUBJECTIVE STATEMENT: Shaynah Hund returns for Initial visit of Intensive Phase CDT to the RLE/RLQ. Pt is accompanied by her mother. Pt reports LE related leg pain is 6/10. Pt has no other new complaints  since initial evaluation.   (10/28/24 Initial OT Eval: Danielle Aguilar is referred to Occupational Therapy by [name], MD, for evaluation and treatment of BLE lymphedema. Pt. Is accompanied by her mother, Danielle Aguilar. Pt ambulates to clinic with ataxic gait using extra time. She reports the pain, redness, and swelling in her right leg and foot worsened a lot during a recent first-ever episode of RLE cellulitis,  which required oral antibiotics Keflex and doxycycline. Since the cellulitis ~ 2 months ago Pt applies skin lotion ( brand unknown) 2 x daily, and applies a large gob of Vaseline at night with clean calf-length socks on top. Pt denies lymphorrhea. Pt states she has had leg swelling  for a long time. She does not know exactly when it started. )  PERTINENT HISTORY:  Chronic venous stasis dermatitis,  10/03/24 BLE Doppler ruled out DVT Uncontrolled DM Type 2 CP w R hemiplegia Myopia Diabetic renal disease Obesity (BMI 43.1) BLE Lymphedema  PAIN:  Are you having pain? Yes, 6/10 numerically; heavy, itchy, aching Decreases: unknown Increases: standing, walking, sitting  PRECAUTIONS: Fall and Other: LYMPHEDEMA: skin precautions 2/2 DM  RED FLAGS: None   WEIGHT BEARING RESTRICTIONS: No  FALLS:  Has patient fallen in last 6 months? TBA  LIVING ENVIRONMENT: Lives with: lives alone Lives in: House/apartment Stairs: TBA Has following equipment at home: None  OCCUPATION: Works part time at [employer]- requires standing and walking  EDUCATION: HS  LEISURE: friends and family time  HAND DOMINANCE: left   PRIOR LEVEL OF FUNCTION: extra time for functional ambulation and transfers, needs AD for self care  PATIENT GOALS: get swelling down and limit recurrence of cellulitis  OBJECTIVE: Moderate, BLE, Stage  II Lymphedema 2/2 suspected CVI and Obesity, R>L  Note: Objective measures were completed at Evaluation unless otherwise noted.  COGNITION:  Overall cognitive status: difficult to assess. Some difficulty with medical hx. Asks excellent  questions. Fully participates in eval with appropriate affect. Processing speed slightly delayed.  OBSERVATIONS / OTHER ASSESSMENTS:  POSTURE: R hemiplegia; random, uncontrolled, jerky movements of R upper extremity, head and trunk  LE ROM: N/T due to spasticity.  Limited ability bend to over to reach feet and distal legs with hands   LYMPHEDEMA ASSESSMENTS:   SURGERY TYPE/DATE: N/A  NUMBER OF LYMPH NODES REMOVED: N/A  CHEMOTHERAPY: N/A  RADIATION:N/A  HORMONE TREATMENT: N/A  INFECTIONS: s/p R leg / foot cellulitis . Rx with oral Keflex and doxycycline   BLE COMPARATIVE LIMB VOLUMETRICS INITIAL 11/04/24  LANDMARK RIGHT    R LEG (A-D) 6737.9 ml  R THIGH (E-G) ml  R FULL LIMB (A-G) ml  Limb Volume differential (LVD)  LVD of LEGS measures 18%, L>R. Typically limb volume differential favors the dominant limb by 3-4 %.  Volume change since initial %  Volume change overall V  (Blank rows = not tested)  LANDMARK LEFT  (dominant)  L LEG (A-D) 5527.9 ml  L THIGH (E-G) ml  L FULL LIMB (A-G) ml  Limb Volume differential (LVD)  %  Volume change since initial %  Volume change overall %  (Blank rows = not tested)    SKIN CONDITION/TISSUE INTEGRITY:  Skin  Description Hyper- Keratosis Peau d d'Orange Shiny Tight Fibrotic/ Indurated Fatty Doughy Spongy/ boggy    Fibrotic skin thickening x  x x       Skin dry Flaky WNL Macerated Crusty Exudate   x x   Dry, cracked and scaly with yellowish discharge crusty plaque   Color Redness Varicosities Blanching Hemosiderin Stain Mottled   x  x   x   Odor Malodorous Yeast Fungal infection  WNL      x   Temperature Warm Cool wnl   x      Pitting Edema   1+ 2+ 3+ 4+ Non-pitting         x   Girth Symmetrical Asymmetrical                   Distribution    R>L toes to groin on R; toes to popliteal on L    Stemmer Sign Positive Negative   Strong +    Lymphorrhea History Of:  Present Absent  x      Wounds History Of  Present Absent Venous Arterial Pressure Sheer   Unknown  x        Signs of Infection Redness Warmth Erythema Acute  Swelling Drainage Borders   x x x Fluctuating x             Sensation Light Touch Deep pressure Hypersensitivity   In tact Impaired In tact Impaired Absent Impaired   Difficult to assess    x     Nails WNL   Fungus nail dystrophy     Severe, discolored    Hair Growth Symmetrical Asymmetrical   Difficult to assess    Skin Creases Base of toes  Ankles   Base of Fingers knees       Abdominal pannus Thigh Lobules  Face/neck   x x  x suspected      GAIT: Distance walked: ataxic Assistive device utilized: None Level of assistance: Modified independence Comments: achilles sx as a child  LYMPHEDEMA LIFE IMPACT SCALE (LLIS): TBA initial Rx visit (The extent to which LE-related problems interfered with your life  over the last week)                                                                                                                            TREATMENT this DATE:  OT evaluation Pt and family edu   PATIENT EDUCATION:  Continued Pt/ CG edu for lymphedema self care home program throughout session. Topics include outcome of comparative limb volumetrics- starting limb volume differentials (LVDs), technology and gradient techniques used for short stretch, multilayer compression wrapping, simple self-MLD, therapeutic lymphatic pumping exercises, skin/nail care, LE precautions, compression garment recommendations and specifications, wear and care schedule and compression garment donning / doffing w assistive devices. Discussed progress towards all OT goals since commencing CDT. Discussed detrimental impact of obesity on lower and upper extremity lymphedema over time. Reviewed OT goals for lymphedema care with Pt and discussed progress to date.  All questions answered to the Pt's satisfaction. Good return. Person educated: Patient  Education method:  Explanation, Demonstration, and Handouts Education comprehension: verbalized understanding, returned demonstration, verbal cues required, and needs further education  LYMPHEDEMA SELF-CARE HOME PROGRAM: BLE lymphatic pumping there ex- 1 set of 10 each element, in order. Hold 5. 2 x daily  Daily, short stretch, thigh or knee length, multilayer compression bandages to one leg at a time during Intensive Phase CDT   During self-management Phase of CDT fit Pt with BLE custom compression garments and HOS devices bilaterally to control swelling. Custom-made gradient compression garments and HOS devices are medically necessary because they are uniquely sized and shaped to fit the exact dimensions of the affected extremities, and to provide appropriate medical grade, graduated compression essential for optimally managing chronic, progressive lymphedema. Multiple custom compression garments are needed to ensure proper hygiene to limit infection risk. Custom compression garments should be replaced q 3-6 months When worn consistently for optimal  lipo-lymphedema self-management over time. HOS devices, medically necessary to limit fibrosis buildup in tissue, should be replaced q 2 years and PRN when worn out.    4. Consider trial of advanced, sequential, pneumatic , compression device, Flexitouch, for optimal LE self management of LE at home over time.  5. Daily simple self MLD used in conjunction with pneumatic device to engage thoracic duct return and to stimulate the terminus LNs in subclavian region.  6. Daily skin care with low ph lotion matching skin ph to reduce infection risk.  7. Leg elevation when seated    ASSESSMENT:  CLINICAL IMPRESSION: The initial limb volume differential (LVD) of the bilateral  of LEGS measures 18%, L>R. Typically limb volume differential favors the dominant limb by 3-4 %. Unfortunately time constrains limited our ability apply gradient compression wraps today. We'll work on that  next t session and sales promotion account executive. Cont as per POC.   (11/04/24 INITIAL OT EVAL: ZARAHI FUERST is a 38 yo female presenting with moderate, stage II, BLE/BLQ lymphedema secondary to suspected venous insufficiency, obesity and trauma resulting from cellulitis. Chronic, progressive limb swelling and associated pain is distributed from toes to groin on the R and below the knee on the left. Lymphedema occurs secondary to trauma when the injury damages lymphatic vessels and structures in the area, leading to a buildup of protein-rich, extra-cellular fluid carrying cellular debris, waste products, bacteria, viruses, and damaged or abnormal cells in the tissues causing swelling, inflammation and pain in the affected body part. Secondary lymphedema arising from significant soft tissue trauma, like lymphedema originating from other issues, is known to delay wound healing and contribute to infection risk, as well as balance problems. Thus, unchecked lymphedema significantly impacts Pt's with comorbid diabetes and it's complications.    Chronic, progressive lymphedema contributes to functional limitans in all occupational domains, including functional ambulation and mobility, basic and instrumental ADLs, productive activities, leisure pursuits, and social participation. Deformity related to chronic swelling and tissue changes also has a negative impact on body image, self esteem and quality of life. BLE lymphedema contributes to elevated infection risk and increased fall risk.  Ms. Meinders will benefit from skilled OT Complete Decongestive Therapy (CDT) the gold standard of lymphedema care, typically includes manual lymphatic drainage (MLD), skin care to limit' infection risk and increase skin excursion, lymphatic pumping exercise, and during the Intensive Phase multilayer, gradient compression bandaging to reduce limb volume. The goal of Intensive Phase CDT is to reduce swelling, reduce infection risk, limit progression,   and master  lymphedema self-care. As Pt transitions to the Self Management Phase of CDT Pt will be fit with appropriate compression garments and/ or devices and will complete a trial of the advanced, sequential, pneumatic, Flexitouch compression device for optimal, long term lymphedema management at home. Without skilled OT for lymphedema care, Pt's condition will progress, and further functional decline is expected.  REHAB POTENTIAL: Pt's prognosis for reducing limb volumes and limiting progression of chronic, progressive lymphedema is poor without daily caregiver assistance with compression during the Intensive Phase of CDT. Lymphedema treatment has a high burden of care. Due to spasticity, limited hip AROM, and impaired hand function Ms. Cara requires daily assistance with application of multilayer, short stretch, knee length, compression bandages  to reduce limb volumes.   With daily caregiver assistance during the Intensive Phase of CDT Pt's prognosis for improvement in her condition is good. With modification and ADL training she may be able to don/ doff and gauge alternative ,  Velcro,  wrap style , compression leggings .    OBJECTIVE IMPAIRMENTS: Abnormal gait, decreased balance, decreased coordination, decreased knowledge of condition, decreased knowledge of use of DME, decreased mobility, difficulty walking, decreased ROM, increased edema, increased fascial restrictions, increased muscle spasms, impaired flexibility, impaired sensation, impaired tone, impaired UE functional use, impaired vision/preception, postural dysfunction, obesity, pain, and chronic BLE swelling with permanent skin changes and related pain.   ACTIVITY LIMITATIONS: Functional ambulation and mobility ( spasticity and limb heaviness 2/2 lymphedema limits ability to walk, perform transfers, climb curbs and stairs, carry, squat) basic and instrumental ADLS ( fit she's and lower body clothing, bathe lower body, stand and walk to  perform self care and home management activities , productive activities at work, leisure pursuits and social participation in the community.    PERSONAL FACTORS: Age, Fitness, Past/current experiences, 3+ comorbidities: CP, obesity, diabetes, and limited caregiver support are also affecting patient's functional outcome. Lymphedema has a high burden of self-care.  REHAB POTENTIAL: Pt's prognosis for reducing limb volumes and limiting progression of chronic, progressive lymphedema is poor without daily caregiver assistance with compression during the Intensive Phase of CDT. Lymphedema treatment has a high burden of care. Due to spasticity, limited hip AROM, and impaired hand function Ms. Newlun requires daily assistance with application of multilayer, short stretch, knee length, compression bandages  to reduce limb volumes.   With daily caregiver assistance during the Intensive Phase of CDT Pt's prognosis for improvement in her condition is good. With modification and ADL training she may be able to don/ doff and gauge alternative , Velcro,  wrap style , compression leggings .    EVALUATION COMPLEXITY: Moderate   SHORT TERM GOALS: Target date: 4th OT Rx visit   Pt will demonstrate understanding of lymphedema precautions and prevention strategies with moderate assistance using a printed reference to identify at least 5 precautions and discussing how s/he may implement them into daily life to reduce risk of progression with extra time. Baseline: Dependent Goal status: INITIAL  2.  Pt will be able to apply multilayer, thigh length, gradient, compression wraps to one leg at a time from toes to groin with max caregiver assist to decrease limb volume, to limit infection risk, and to limit lymphedema progression.  Baseline: Dependent Goal status: INITIAL  LONG TERM GOALS: Target date: 01/26/25  1.Given this patient's Intake score of TBA % on the Lymphedema Life Impact Scale (LLIS), patient will  experience a reduction of at least 5 points in her perceived level of functional impairment resulting from lymphedema to improve functional performance and quality of life (QOL). Baseline: TBA % Goal status: INITIAL  2.  Pt will achieve at least a 10% volume reduction in BLE to return limbs to typical size and shape, to limit infection risk and LE progression, to decrease pain, to improve function. Baseline: Dependent Goal status: INITIAL  3.  Pt will obtain appropriate compression garments/devices and achieve modified independence (extra time + assistive devices) with donning/doffing to optimize limb volume reductions and limit LE progression over time. Baseline: Dependent Goal status: INITIAL  4. During Intensive phase CDT, with modified independence, Pt will achieve at least 85% compliance with all adapted lymphedema self-care home program components, including daily skin care, compression wraps and /or garments, simple self MLD and lymphatic pumping therex to habituate LE self care protocol  into ADLs for optimal LE self-management over time. Baseline: Dependent Goal status: INITIAL  Pt will be able to don and doff Flexitouch advanced sequential  compression device garments correctly and operate device according to therapist determined program within   2 weeks of receiving device and undergoing manufacturer's training.  Baseline: Dependent Goal status: INITIAL  PLAN:  OT FREQUENCY: 2x/week  OT DURATION: 12-18  weeks due to bilaterality and severity. We treat one limb at a time only to limit fall risk.  PLANNED INTERVENTIONS: Complete Decongestive Therapy (CDT) to one leg a time , to include 97110-Therapeutic exercises, 97530- Therapeutic activity, 97535- Self Care, [id number]- Manual therapy, Patient/Family education, Manual lymph drainage, Compression bandaging and fitting with accessible, comfortable ,effective , day and nighttime compression garments, DME instructions, and skin care to  reduce infection risk  PLAN FOR NEXT SESSION:  BLE comparative limb volumetrics Commence teaching caregiver to apply compression wraps Teach patient to direct a caregiver   Zebedee Dec, MS, OTR/L, CLT-LANA 11/05/24 4:01 PM

## 2024-11-06 ENCOUNTER — Encounter: Admitting: Registered"

## 2024-11-07 ENCOUNTER — Ambulatory Visit: Admitting: Occupational Therapy

## 2024-11-07 ENCOUNTER — Encounter: Payer: Self-pay | Admitting: Occupational Therapy

## 2024-11-07 DIAGNOSIS — I890 Lymphedema, not elsewhere classified: Secondary | ICD-10-CM

## 2024-11-07 NOTE — Therapy (Signed)
 OUTPATIENT OCCUPATIONAL THERAPY TREATMENT NOTE  RIGHT LOWER EXTREMITY/  RIGHT LOWER QUADRANT  LYMPHEDEMA  Patient Name: AMEYA VOWELL MRN: [id number] DOB:[birth year], 38 y.o., female Today's Date: 11/07/2024  END OF SESSION:  OT End of Session - 11/07/24 1221     Visit Number 3    Number of Visits 36    Date for Recertification  01/26/25    OT Start Time 1105    OT Stop Time 1221    OT Time Calculation (min) 76 min    Activity Tolerance Patient tolerated treatment well;No increased pain;Other (comment)   CP with R hemiplegia   Behavior During Therapy WFL for tasks assessed/performed            Past Medical History:  Diagnosis Date   Diabetes mellitus without complication (HCC)    History reviewed. No pertinent surgical history. Patient Active Problem List   Diagnosis Date Noted   Allergic rhinitis 02/23/2022   Cerebral palsy (HCC) 02/23/2022   Chronic kidney disease, stage 1 02/23/2022   Diabetic renal disease (HCC) 02/23/2022   Gastro-esophageal reflux disease without esophagitis 02/23/2022   Hemiplegia of dominant side (HCC) 02/23/2022   Morbid obesity (HCC) 02/23/2022   Pure hypercholesterolemia 02/23/2022   Type 2 diabetes mellitus with diabetic nephropathy (HCC) 02/23/2022   Uncontrolled type 2 diabetes mellitus with hyperglycemia (HCC) 08/14/2018    PCP: Velna Barrow, MD  REFERRING PROVIDER: same  REFERRING DIAG: I89.0  THERAPY DIAG:  Lymphedema, not elsewhere classified  Rationale for Evaluation and Treatment: Rehabilitation  ONSET DATE: several years ago. No known precipitating event  SUBJECTIVE:                                                                                                                                                                                           SUBJECTIVE STATEMENT: Marcelene Weidemann returns for Initial visit of Intensive Phase CDT to the RLE/RLQ. Pt is accompanied by her mother. Pt reports LE related leg pain is 6/10. Pt  has no other new complaints  since initial evaluation. Pt obtained new wider leg jeans and threw away old crocks shoes replacing them with new ones as directed. Pt also saw her doctor and obtained another course of antibiotic as directed. Pt's mom is an excellent support while enabling the highest level independence possible.   (10/28/24 Initial OT Eval: [name] is referred to Occupational Therapy by [name], MD, for evaluation and treatment of BLE lymphedema. Pt. Is accompanied by her mother, [name]. Pt ambulates to clinic with ataxic gait using extra time. She reports the pain, redness, and swelling in her right leg and foot worsened  a lot during a recent first-ever episode of RLE cellulitis,  which required oral antibiotics Keflex and doxycycline. Since the cellulitis ~ 2 months ago Pt applies skin lotion ( brand unknown) 2 x daily, and applies a large gob of Vaseline at night with clean calf-length socks on top. Pt denies lymphorrhea. Pt states she has had leg swelling for a long time. She does not know exactly when it started. )  PERTINENT HISTORY:  Chronic venous stasis dermatitis,  10/03/24 BLE Doppler ruled out DVT Uncontrolled DM Type 2 CP w R hemiplegia Myopia Diabetic renal disease Obesity (BMI 43.1) BLE Lymphedema  PAIN:  Are you having pain? Yes, 6/10 numerically; heavy, itchy, aching Decreases: unknown Increases: standing, walking, sitting  PRECAUTIONS: Fall and Other: LYMPHEDEMA: skin precautions 2/2 DM  RED FLAGS: None   WEIGHT BEARING RESTRICTIONS: No  FALLS:  Has patient fallen in last 6 months? TBA  LIVING ENVIRONMENT: Lives with: lives alone Lives in: House/apartment Stairs: TBA Has following equipment at home: None  OCCUPATION: Works part time at [employer]- requires standing and walking  EDUCATION: HS  LEISURE: friends and family time  HAND DOMINANCE: left   PRIOR LEVEL OF FUNCTION: extra time for functional ambulation and transfers,  needs AD for self care  PATIENT GOALS: get swelling down and limit recurrence of cellulitis  OBJECTIVE: Moderate, BLE, Stage  II Lymphedema 2/2 suspected CVI and Obesity, R>L  Note: Objective measures were completed at Evaluation unless otherwise noted.  COGNITION:  Overall cognitive status: difficult to assess. Some difficulty with medical hx. Asks excellent questions. Fully participates in eval with appropriate affect. Processing speed slightly delayed.  OBSERVATIONS / OTHER ASSESSMENTS:  POSTURE: R hemiplegia; random, uncontrolled, jerky movements of R upper extremity, head and trunk  LE ROM: N/T due to spasticity.  Limited ability bend to over to reach feet and distal legs with hands   LYMPHEDEMA ASSESSMENTS:   SURGERY TYPE/DATE: N/A  NUMBER OF LYMPH NODES REMOVED: N/A  CHEMOTHERAPY: N/A  RADIATION:N/A  HORMONE TREATMENT: N/A  INFECTIONS: s/p R leg / foot cellulitis . Rx with oral Keflex and doxycycline   BLE COMPARATIVE LIMB VOLUMETRICS INITIAL 11/04/24  LANDMARK RIGHT    R LEG (A-D) 6737.9 ml  R THIGH (E-G) ml  R FULL LIMB (A-G) ml  Limb Volume differential (LVD)  LVD of LEGS measures 18%, L>R. Typically limb volume differential favors the dominant limb by 3-4 %.  Volume change since initial %  Volume change overall V  (Blank rows = not tested)  LANDMARK LEFT  (dominant)  L LEG (A-D) 5527.9 ml  L THIGH (E-G) ml  L FULL LIMB (A-G) ml  Limb Volume differential (LVD)  %  Volume change since initial %  Volume change overall %  (Blank rows = not tested)    SKIN CONDITION/TISSUE INTEGRITY:  Skin  Description Hyper- Keratosis Peau d d'Orange Shiny Tight Fibrotic/ Indurated Fatty Doughy Spongy/ boggy    Fibrotic skin thickening x  x x       Skin dry Flaky WNL Macerated Crusty Exudate   x x   Dry, cracked and scaly with yellowish discharge crusty plaque   Color Redness Varicosities Blanching Hemosiderin Stain Mottled   x  x   x   Odor Malodorous Yeast  Fungal infection  WNL      x   Temperature Warm Cool wnl   x      Pitting Edema   1+ 2+ 3+ 4+ Non-pitting  x   Girth Symmetrical Asymmetrical                   Distribution    R>L toes to groin on R; toes to popliteal on L    Stemmer Sign Positive Negative   Strong +    Lymphorrhea History Of:  Present Absent   x      Wounds History Of Present Absent Venous Arterial Pressure Sheer   Unknown  x        Signs of Infection Redness Warmth Erythema Acute  Swelling Drainage Borders   x x x Fluctuating x             Sensation Light Touch Deep pressure Hypersensitivity   In tact Impaired In tact Impaired Absent Impaired   Difficult to assess    x     Nails WNL   Fungus nail dystrophy     Severe, discolored    Hair Growth Symmetrical Asymmetrical   Difficult to assess    Skin Creases Base of toes  Ankles   Base of Fingers knees       Abdominal pannus Thigh Lobules  Face/neck   x x  x suspected      GAIT: Distance walked: ataxic Assistive device utilized: None Level of assistance: Modified independence Comments: achilles sx as a child  LYMPHEDEMA LIFE IMPACT SCALE (LLIS): TBA initial Rx visit (The extent to which LE-related problems interfered with your life  over the last week)                                                                                                                            TREATMENT this DATE:  Pt and family edu Multilayer , gradient compression bandaging- knee length R LEG   PATIENT EDUCATION:  Continued Pt/ CG edu for lymphedema self care home program throughout session. Topics include outcome of comparative limb volumetrics- starting limb volume differentials (LVDs), technology and gradient techniques used for short stretch, multilayer compression wrapping, simple self-MLD, therapeutic lymphatic pumping exercises, skin/nail care, LE precautions, compression garment recommendations and specifications, wear and care  schedule and compression garment donning / doffing w assistive devices. Discussed progress towards all OT goals since commencing CDT. Discussed detrimental impact of obesity on lower and upper extremity lymphedema over time. Reviewed OT goals for lymphedema care with Pt and discussed progress to date.  All questions answered to the Pt's satisfaction. Good return. Person educated: Patient  Education method: Explanation, Demonstration, and Handouts Education comprehension: verbalized understanding, returned demonstration, verbal cues required, and needs further education  LYMPHEDEMA SELF-CARE HOME PROGRAM: BLE lymphatic pumping there ex- 1 set of 10 each element, in order. Hold 5. 2 x daily  Daily, short stretch, thigh or knee length, multilayer compression bandages to one leg at a time during Intensive Phase CDT   During self-management Phase of CDT fit Pt with BLE custom compression garments and HOS devices bilaterally to control swelling. Custom-made gradient  compression garments and HOS devices are medically necessary because they are uniquely sized and shaped to fit the exact dimensions of the affected extremities, and to provide appropriate medical grade, graduated compression essential for optimally managing chronic, progressive lymphedema. Multiple custom compression garments are needed to ensure proper hygiene to limit infection risk. Custom compression garments should be replaced q 3-6 months When worn consistently for optimal lipo-lymphedema self-management over time. HOS devices, medically necessary to limit fibrosis buildup in tissue, should be replaced q 2 years and PRN when worn out.    4. Consider trial of advanced, sequential, pneumatic , compression device, Flexitouch, for optimal LE self management of LE at home over time.  5. Daily simple self MLD used in conjunction with pneumatic device to engage thoracic duct return and to stimulate the terminus LNs in subclavian region.  6.  Daily skin care with low ph lotion matching skin ph to reduce infection risk.  7. Leg elevation when seated    ASSESSMENT:  CLINICAL IMPRESSION: Emphasis of visit today is on teaching Pt and mother how to apply multilayer compression wraps using short stretch wraps. We modified the initial bandage with a loop on one end, secured with a safety pin, to loop over foot and start the bandaging sequence. With ongoing verbal and manual cues Pt is able to don foot wrap with max assist and extra time. Pt instructed to bring in a shoe we could possibly modify since her new crocs does not fit over wraps.  Cont as per POC.   (11/04/24 INITIAL OT EVAL: KYLAN VEACH is a 38 yo female presenting with moderate, stage II, BLE/BLQ lymphedema secondary to suspected venous insufficiency, obesity and trauma resulting from cellulitis. Chronic, progressive limb swelling and associated pain is distributed from toes to groin on the R and below the knee on the left. Lymphedema occurs secondary to trauma when the injury damages lymphatic vessels and structures in the area, leading to a buildup of protein-rich, extra-cellular fluid carrying cellular debris, waste products, bacteria, viruses, and damaged or abnormal cells in the tissues causing swelling, inflammation and pain in the affected body part. Secondary lymphedema arising from significant soft tissue trauma, like lymphedema originating from other issues, is known to delay wound healing and contribute to infection risk, as well as balance problems. Thus, unchecked lymphedema significantly impacts Pt's with comorbid diabetes and it's complications.    Chronic, progressive lymphedema contributes to functional limitans in all occupational domains, including functional ambulation and mobility, basic and instrumental ADLs, productive activities, leisure pursuits, and social participation. Deformity related to chronic swelling and tissue changes also has a negative impact on body  image, self esteem and quality of life. BLE lymphedema contributes to elevated infection risk and increased fall risk.  Ms. Mcdougle will benefit from skilled OT Complete Decongestive Therapy (CDT) the gold standard of lymphedema care, typically includes manual lymphatic drainage (MLD), skin care to limit' infection risk and increase skin excursion, lymphatic pumping exercise, and during the Intensive Phase multilayer, gradient compression bandaging to reduce limb volume. The goal of Intensive Phase CDT is to reduce swelling, reduce infection risk, limit progression,  and master  lymphedema self-care. As Pt transitions to the Self Management Phase of CDT Pt will be fit with appropriate compression garments and/ or devices and will complete a trial of the advanced, sequential, pneumatic, Flexitouch compression device for optimal, long term lymphedema management at home. Without skilled OT for lymphedema care, Pt's condition will progress, and further functional decline is  expected.  REHAB POTENTIAL: Pt's prognosis for reducing limb volumes and limiting progression of chronic, progressive lymphedema is poor without daily caregiver assistance with compression during the Intensive Phase of CDT. Lymphedema treatment has a high burden of care. Due to spasticity, limited hip AROM, and impaired hand function Ms. Caldeira requires daily assistance with application of multilayer, short stretch, knee length, compression bandages  to reduce limb volumes.   With daily caregiver assistance during the Intensive Phase of CDT Pt's prognosis for improvement in her condition is good. With modification and ADL training she may be able to don/ doff and gauge alternative , Velcro,  wrap style , compression leggings .    OBJECTIVE IMPAIRMENTS: Abnormal gait, decreased balance, decreased coordination, decreased knowledge of condition, decreased knowledge of use of DME, decreased mobility, difficulty walking, decreased ROM, increased  edema, increased fascial restrictions, increased muscle spasms, impaired flexibility, impaired sensation, impaired tone, impaired UE functional use, impaired vision/preception, postural dysfunction, obesity, pain, and chronic BLE swelling with permanent skin changes and related pain.   ACTIVITY LIMITATIONS: Functional ambulation and mobility ( spasticity and limb heaviness 2/2 lymphedema limits ability to walk, perform transfers, climb curbs and stairs, carry, squat) basic and instrumental ADLS ( fit she's and lower body clothing, bathe lower body, stand and walk to perform self care and home management activities , productive activities at work, leisure pursuits and social participation in the community.    PERSONAL FACTORS: Age, Fitness, Past/current experiences, 3+ comorbidities: CP, obesity, diabetes, and limited caregiver support are also affecting patient's functional outcome. Lymphedema has a high burden of self-care.  REHAB POTENTIAL: Pt's prognosis for reducing limb volumes and limiting progression of chronic, progressive lymphedema is poor without daily caregiver assistance with compression during the Intensive Phase of CDT. Lymphedema treatment has a high burden of care. Due to spasticity, limited hip AROM, and impaired hand function Ms. Trimmer requires daily assistance with application of multilayer, short stretch, knee length, compression bandages  to reduce limb volumes.   With daily caregiver assistance during the Intensive Phase of CDT Pt's prognosis for improvement in her condition is good. With modification and ADL training she may be able to don/ doff and gauge alternative , Velcro,  wrap style , compression leggings .    EVALUATION COMPLEXITY: Moderate   SHORT TERM GOALS: Target date: 4th OT Rx visit   Pt will demonstrate understanding of lymphedema precautions and prevention strategies with moderate assistance using a printed reference to identify at least 5 precautions and  discussing how s/he may implement them into daily life to reduce risk of progression with extra time. Baseline: Dependent Goal status: INITIAL  2.  Pt will be able to apply multilayer, thigh length, gradient, compression wraps to one leg at a time from toes to groin with max caregiver assist to decrease limb volume, to limit infection risk, and to limit lymphedema progression.  Baseline: Dependent Goal status: INITIAL  LONG TERM GOALS: Target date: 01/26/25  1.Given this patient's Intake score of TBA % on the Lymphedema Life Impact Scale (LLIS), patient will experience a reduction of at least 5 points in her perceived level of functional impairment resulting from lymphedema to improve functional performance and quality of life (QOL). Baseline: TBA % Goal status: INITIAL  2.  Pt will achieve at least a 10% volume reduction in BLE to return limbs to typical size and shape, to limit infection risk and LE progression, to decrease pain, to improve function. Baseline: Dependent Goal status: INITIAL  3.  Pt will obtain appropriate compression garments/devices and achieve modified independence (extra time + assistive devices) with donning/doffing to optimize limb volume reductions and limit LE progression over time. Baseline: Dependent Goal status: INITIAL  4. During Intensive phase CDT, with modified independence, Pt will achieve at least 85% compliance with all adapted lymphedema self-care home program components, including daily skin care, compression wraps and /or garments, simple self MLD and lymphatic pumping therex to habituate LE self care protocol  into ADLs for optimal LE self-management over time. Baseline: Dependent Goal status: INITIAL  Pt will be able to don and doff Flexitouch advanced sequential compression device garments correctly and operate device according to therapist determined program within   2 weeks of receiving device and undergoing manufacturer's training.  Baseline:  Dependent Goal status: INITIAL  PLAN:  OT FREQUENCY: 2x/week  OT DURATION: 12-18  weeks due to bilaterality and severity. We treat one limb at a time only to limit fall risk.  PLANNED INTERVENTIONS: Complete Decongestive Therapy (CDT) to one leg a time , to include 97110-Therapeutic exercises, 97530- Therapeutic activity, 97535- Self Care, [id number]- Manual therapy, Patient/Family education, Manual lymph drainage, Compression bandaging and fitting with accessible, comfortable ,effective , day and nighttime compression garments, DME instructions, and skin care to reduce infection risk  PLAN FOR NEXT SESSION:  BLE comparative limb volumetrics Commence teaching caregiver to apply compression wraps Teach patient to direct a caregiver   Zebedee Dec, MS, OTR/L, CLT-LANA 11/07/24 12:24 PM

## 2024-11-12 ENCOUNTER — Ambulatory Visit: Admitting: Occupational Therapy

## 2024-11-14 ENCOUNTER — Encounter: Payer: Self-pay | Admitting: Occupational Therapy

## 2024-11-14 ENCOUNTER — Ambulatory Visit: Admitting: Occupational Therapy

## 2024-11-14 DIAGNOSIS — I890 Lymphedema, not elsewhere classified: Secondary | ICD-10-CM | POA: Diagnosis not present

## 2024-11-14 NOTE — Therapy (Signed)
 OUTPATIENT OCCUPATIONAL THERAPY TREATMENT NOTE  RIGHT LOWER EXTREMITY/  RIGHT LOWER QUADRANT  LYMPHEDEMA  Patient Name: JERZEY KOMPERDA MRN: [id number] DOB:05/09/1986, 38 y.o., female Today's Date: 11/14/2024  END OF SESSION:  OT End of Session - 11/14/24 1026     Visit Number 4    Number of Visits 36    Date for Recertification  01/26/25    OT Start Time 0909    OT Stop Time 1010    OT Time Calculation (min) 61 min    Activity Tolerance Patient tolerated treatment well;No increased pain;Other (comment)   CP with R hemiplegia   Behavior During Therapy WFL for tasks assessed/performed            Past Medical History:  Diagnosis Date   Diabetes mellitus without complication (HCC)    History reviewed. No pertinent surgical history. Patient Active Problem List   Diagnosis Date Noted   Allergic rhinitis 02/23/2022   Cerebral palsy (HCC) 02/23/2022   Chronic kidney disease, stage 1 02/23/2022   Diabetic renal disease (HCC) 02/23/2022   Gastro-esophageal reflux disease without esophagitis 02/23/2022   Hemiplegia of dominant side (HCC) 02/23/2022   Morbid obesity (HCC) 02/23/2022   Pure hypercholesterolemia 02/23/2022   Type 2 diabetes mellitus with diabetic nephropathy (HCC) 02/23/2022   Uncontrolled type 2 diabetes mellitus with hyperglycemia (HCC) 08/14/2018    PCP: Velna Barrow, MD  REFERRING PROVIDER: same  REFERRING DIAG: I89.0  THERAPY DIAG:  Lymphedema, not elsewhere classified  Rationale for Evaluation and Treatment: Rehabilitation  ONSET DATE: several years ago. No known precipitating event  SUBJECTIVE:                                                                                                                                                                                           SUBJECTIVE STATEMENT: Umaima Scholten presents to OT for lymphedema care to bilateral legs, with  RLE/RLQ> L. Pt is accompanied by her mother. Pt reports LE related leg pain is  6/10. Pt reports her leg was itching when wrapped that she scratched it until it bled. OT explained histamine reaction and instructed her to rub leg with a soft wash cloth instead of scratching her skin with her nails as this increases infection risk. Pt reports she feels frustrated and discouraged about applying multilayer compression bandages to reduce leg swelling. Pt's mother also expresses concern since Pt lives alone and does not have a caregiver available every day. Pt did not practice compression wrapping between visits.  (10/28/24 Initial OT Eval: [name] is referred to Occupational Therapy by [name], MD, for evaluation and treatment of BLE lymphedema.  Pt. Is accompanied by her mother, [name]. Pt ambulates to clinic with ataxic gait using extra time. She reports the pain, redness, and swelling in her right leg and foot worsened a lot during a recent first-ever episode of RLE cellulitis,  which required oral antibiotics Keflex and doxycycline. Since the cellulitis ~ 2 months ago Pt applies skin lotion ( brand unknown) 2 x daily, and applies a large gob of Vaseline at night with clean calf-length socks on top. Pt denies lymphorrhea. Pt states she has had leg swelling for a long time. She does not know exactly when it started. )  PERTINENT HISTORY:  Chronic venous stasis dermatitis,  10/03/24 BLE Doppler ruled out DVT Uncontrolled DM Type 2 CP w R hemiplegia Myopia Diabetic renal disease Obesity (BMI 43.1) BLE Lymphedema  PAIN:  Are you having pain? Yes, 6/10 numerically; heavy, itchy, aching Decreases: unknown Increases: standing, walking, sitting  PRECAUTIONS: Fall and Other: LYMPHEDEMA: skin precautions 2/2 DM  RED FLAGS: None   WEIGHT BEARING RESTRICTIONS: No  FALLS:  Has patient fallen in last 6 months? TBA  LIVING ENVIRONMENT: Lives with: lives alone Lives in: House/apartment Stairs: TBA Has following equipment at home: None  OCCUPATION: Works part time  at [employer]- requires standing and walking  EDUCATION: HS  LEISURE: friends and family time  HAND DOMINANCE: left   PRIOR LEVEL OF FUNCTION: extra time for functional ambulation and transfers, needs AD for self care  PATIENT GOALS: get swelling down and limit recurrence of cellulitis  OBJECTIVE: Moderate, BLE, Stage  II Lymphedema 2/2 suspected CVI and Obesity, R>L  Note: Objective measures were completed at Evaluation unless otherwise noted.  COGNITION:  Overall cognitive status: difficult to assess. Some difficulty with medical hx. Asks excellent questions. Fully participates in eval with appropriate affect. Processing speed slightly delayed.  OBSERVATIONS / OTHER ASSESSMENTS:  POSTURE: R hemiplegia; random, uncontrolled, jerky movements of R upper extremity, head and trunk  LE ROM: N/T due to spasticity.  Limited ability bend to over to reach feet and distal legs with hands   LYMPHEDEMA ASSESSMENTS:   SURGERY TYPE/DATE: N/A  NUMBER OF LYMPH NODES REMOVED: N/A  CHEMOTHERAPY: N/A  RADIATION:N/A  HORMONE TREATMENT: N/A  INFECTIONS: s/p R leg / foot cellulitis . Rx with oral Keflex and doxycycline   BLE COMPARATIVE LIMB VOLUMETRICS INITIAL 11/04/24  LANDMARK RIGHT    R LEG (A-D) 6737.9 ml  R THIGH (E-G) ml  R FULL LIMB (A-G) ml  Limb Volume differential (LVD)  LVD of LEGS measures 18%, L>R. Typically limb volume differential favors the dominant limb by 3-4 %.  Volume change since initial %  Volume change overall V  (Blank rows = not tested)  LANDMARK LEFT  (dominant)  L LEG (A-D) 5527.9 ml  L THIGH (E-G) ml  L FULL LIMB (A-G) ml  Limb Volume differential (LVD)  %  Volume change since initial %  Volume change overall %  (Blank rows = not tested)    SKIN CONDITION/TISSUE INTEGRITY:  Skin  Description Hyper- Keratosis Peau d d'Orange Shiny Tight Fibrotic/ Indurated Fatty Doughy Spongy/ boggy    Fibrotic skin thickening x  x x       Skin dry Flaky WNL  Macerated Crusty Exudate   x x   Dry, cracked and scaly with yellowish discharge crusty plaque   Color Redness Varicosities Blanching Hemosiderin Stain Mottled   x  x   x   Odor Malodorous Yeast Fungal infection  WNL  x   Temperature Warm Cool wnl   x      Pitting Edema   1+ 2+ 3+ 4+ Non-pitting         x   Girth Symmetrical Asymmetrical                   Distribution    R>L toes to groin on R; toes to popliteal on L    Stemmer Sign Positive Negative   Strong +    Lymphorrhea History Of:  Present Absent   x      Wounds History Of Present Absent Venous Arterial Pressure Sheer   Unknown  x        Signs of Infection Redness Warmth Erythema Acute  Swelling Drainage Borders   x x x Fluctuating x             Sensation Light Touch Deep pressure Hypersensitivity   In tact Impaired In tact Impaired Absent Impaired   Difficult to assess    x     Nails WNL   Fungus nail dystrophy     Severe, discolored    Hair Growth Symmetrical Asymmetrical   Difficult to assess    Skin Creases Base of toes  Ankles   Base of Fingers knees       Abdominal pannus Thigh Lobules  Face/neck   x x  x suspected      GAIT: Distance walked: ataxic Assistive device utilized: None Level of assistance: Modified independence Comments: achilles sx as a child  LYMPHEDEMA LIFE IMPACT SCALE (LLIS): TBA initial Rx visit (The extent to which LE-related problems interfered with your life  over the last week)                                                                                                                            TREATMENT this DATE:  Pt and family edu Modified existing Custom CircAid left in clinic > 7 years ago as a sort DIY    PATIENT EDUCATION:  Continued Pt/ CG edu for lymphedema self care home program throughout session. Topics include outcome of comparative limb volumetrics- starting limb volume differentials (LVDs), technology and gradient techniques used  for short stretch, multilayer compression wrapping, simple self-MLD, therapeutic lymphatic pumping exercises, skin/nail care, LE precautions, compression garment recommendations and specifications, wear and care schedule and compression garment donning / doffing w assistive devices. Discussed progress towards all OT goals since commencing CDT. Discussed detrimental impact of obesity on lower and upper extremity lymphedema over time. Reviewed OT goals for lymphedema care with Pt and discussed progress to date.  All questions answered to the Pt's satisfaction. Good return. Person educated: Patient  Education method: Explanation, Demonstration, and Handouts Education comprehension: verbalized understanding, returned demonstration, verbal cues required, and needs further education  LYMPHEDEMA SELF-CARE HOME PROGRAM: BLE lymphatic pumping there ex- 1 set of 10 each element, in order. Hold 5. 2 x daily  Daily, short stretch, thigh  or knee length, multilayer compression bandages to one leg at a time during Intensive Phase CDT   During self-management Phase of CDT fit Pt with BLE custom compression garments and HOS devices bilaterally to control swelling. Custom-made gradient compression garments and HOS devices are medically necessary because they are uniquely sized and shaped to fit the exact dimensions of the affected extremities, and to provide appropriate medical grade, graduated compression essential for optimally managing chronic, progressive lymphedema. Multiple custom compression garments are needed to ensure proper hygiene to limit infection risk. Custom compression garments should be replaced q 3-6 months When worn consistently for optimal lipo-lymphedema self-management over time. HOS devices, medically necessary to limit fibrosis buildup in tissue, should be replaced q 2 years and PRN when worn out.    4. Consider trial of advanced, sequential, pneumatic , compression device, Flexitouch, for optimal  LE self management of LE at home over time.  5. Daily simple self MLD used in conjunction with pneumatic device to engage thoracic duct return and to stimulate the terminus LNs in subclavian region.  6. Daily skin care with low ph lotion matching skin ph to reduce infection risk.  7. Leg elevation when seated    ASSESSMENT:  CLINICAL IMPRESSION: Emphasis of session on fabrication of and training with  custom DIY , Velcro style, gradient compression wrap to use as a reduction kit instead of short stretch bandages. Due to hemiplegia Pt is unable to don multilayer bandages  with enough compression to  effectively reduce and control leg swelling.  After modifying existing, abandoned garment alternative (Custom CircAid Juxtafit essential) remainder of session provided opportunities for Pt to apply and gage the compression of these garment==ment alternatives, By end of session Pt required With ongoing but waxing verbal and manual cues. Cont as per POC.   (11/04/24 INITIAL OT EVAL: VANCE BELCOURT is a 38 yo female presenting with moderate, stage II, BLE/BLQ lymphedema secondary to suspected venous insufficiency, obesity and trauma resulting from cellulitis. Chronic, progressive limb swelling and associated pain is distributed from toes to groin on the R and below the knee on the left. Lymphedema occurs secondary to trauma when the injury damages lymphatic vessels and structures in the area, leading to a buildup of protein-rich, extra-cellular fluid carrying cellular debris, waste products, bacteria, viruses, and damaged or abnormal cells in the tissues causing swelling, inflammation and pain in the affected body part. Secondary lymphedema arising from significant soft tissue trauma, like lymphedema originating from other issues, is known to delay wound healing and contribute to infection risk, as well as balance problems. Thus, unchecked lymphedema significantly impacts Pt's with comorbid diabetes and it's  complications.    Chronic, progressive lymphedema contributes to functional limitans in all occupational domains, including functional ambulation and mobility, basic and instrumental ADLs, productive activities, leisure pursuits, and social participation. Deformity related to chronic swelling and tissue changes also has a negative impact on body image, self esteem and quality of life. BLE lymphedema contributes to elevated infection risk and increased fall risk.  Ms. Hughston will benefit from skilled OT Complete Decongestive Therapy (CDT) the gold standard of lymphedema care, typically includes manual lymphatic drainage (MLD), skin care to limit' infection risk and increase skin excursion, lymphatic pumping exercise, and during the Intensive Phase multilayer, gradient compression bandaging to reduce limb volume. The goal of Intensive Phase CDT is to reduce swelling, reduce infection risk, limit progression,  and master  lymphedema self-care. As Pt transitions to the Self Management Phase of CDT  Pt will be fit with appropriate compression garments and/ or devices and will complete a trial of the advanced, sequential, pneumatic, Flexitouch compression device for optimal, long term lymphedema management at home. Without skilled OT for lymphedema care, Pt's condition will progress, and further functional decline is expected.  REHAB POTENTIAL: Pt's prognosis for reducing limb volumes and limiting progression of chronic, progressive lymphedema is poor without daily caregiver assistance with compression during the Intensive Phase of CDT. Lymphedema treatment has a high burden of care. Due to spasticity, limited hip AROM, and impaired hand function Ms. Spengler requires daily assistance with application of multilayer, short stretch, knee length, compression bandages  to reduce limb volumes.   With daily caregiver assistance during the Intensive Phase of CDT Pt's prognosis for improvement in her condition is good. With  modification and ADL training she may be able to don/ doff and gauge alternative , Velcro,  wrap style , compression leggings .    OBJECTIVE IMPAIRMENTS: Abnormal gait, decreased balance, decreased coordination, decreased knowledge of condition, decreased knowledge of use of DME, decreased mobility, difficulty walking, decreased ROM, increased edema, increased fascial restrictions, increased muscle spasms, impaired flexibility, impaired sensation, impaired tone, impaired UE functional use, impaired vision/preception, postural dysfunction, obesity, pain, and chronic BLE swelling with permanent skin changes and related pain.   ACTIVITY LIMITATIONS: Functional ambulation and mobility ( spasticity and limb heaviness 2/2 lymphedema limits ability to walk, perform transfers, climb curbs and stairs, carry, squat) basic and instrumental ADLS ( fit she's and lower body clothing, bathe lower body, stand and walk to perform self care and home management activities , productive activities at work, leisure pursuits and social participation in the community.    PERSONAL FACTORS: Age, Fitness, Past/current experiences, 3+ comorbidities: CP, obesity, diabetes, and limited caregiver support are also affecting patient's functional outcome. Lymphedema has a high burden of self-care.  REHAB POTENTIAL: Pt's prognosis for reducing limb volumes and limiting progression of chronic, progressive lymphedema is poor without daily caregiver assistance with compression during the Intensive Phase of CDT. Lymphedema treatment has a high burden of care. Due to spasticity, limited hip AROM, and impaired hand function Ms. Rada requires daily assistance with application of multilayer, short stretch, knee length, compression bandages  to reduce limb volumes.   With daily caregiver assistance during the Intensive Phase of CDT Pt's prognosis for improvement in her condition is good. With modification and ADL training she may be able to don/  doff and gauge alternative , Velcro,  wrap style , compression leggings .    EVALUATION COMPLEXITY: Moderate   SHORT TERM GOALS: Target date: 4th OT Rx visit   Pt will demonstrate understanding of lymphedema precautions and prevention strategies with moderate assistance using a printed reference to identify at least 5 precautions and discussing how s/he may implement them into daily life to reduce risk of progression with extra time. Baseline: Dependent Goal status: INITIAL  2.  Pt will be able to apply multilayer, thigh length, gradient, compression wraps to one leg at a time from toes to groin with max caregiver assist to decrease limb volume, to limit infection risk, and to limit lymphedema progression.  Baseline: Dependent Goal status: INITIAL  LONG TERM GOALS: Target date: 01/26/25  1.Given this patient's Intake score of TBA % on the Lymphedema Life Impact Scale (LLIS), patient will experience a reduction of at least 5 points in her perceived level of functional impairment resulting from lymphedema to improve functional performance and quality of life (QOL). Baseline:  TBA % Goal status: INITIAL  2.  Pt will achieve at least a 10% volume reduction in BLE to return limbs to typical size and shape, to limit infection risk and LE progression, to decrease pain, to improve function. Baseline: Dependent Goal status: INITIAL  3.  Pt will obtain appropriate compression garments/devices and achieve modified independence (extra time + assistive devices) with donning/doffing to optimize limb volume reductions and limit LE progression over time. Baseline: Dependent Goal status: INITIAL  4. During Intensive phase CDT, with modified independence, Pt will achieve at least 85% compliance with all adapted lymphedema self-care home program components, including daily skin care, compression wraps and /or garments, simple self MLD and lymphatic pumping therex to habituate LE self care protocol  into  ADLs for optimal LE self-management over time. Baseline: Dependent Goal status: INITIAL  Pt will be able to don and doff Flexitouch advanced sequential compression device garments correctly and operate device according to therapist determined program within   2 weeks of receiving device and undergoing manufacturer's training.  Baseline: Dependent Goal status: INITIAL  PLAN:  OT FREQUENCY: 2x/week  OT DURATION: 12-18  weeks due to bilaterality and severity. We treat one limb at a time only to limit fall risk.  PLANNED INTERVENTIONS: Complete Decongestive Therapy (CDT) to one leg a time , to include 97110-Therapeutic exercises, 97530- Therapeutic activity, 97535- Self Care, [id number]- Manual therapy, Patient/Family education, Manual lymph drainage, Compression bandaging and fitting with accessible, comfortable ,effective , day and nighttime compression garments, DME instructions, and skin care to reduce infection risk  PLAN FOR NEXT SESSION:  BLE comparative limb volumetrics Commence teaching caregiver to apply compression wraps Teach patient to direct a caregiver   Zebedee Dec, MS, OTR/L, CLT-LANA 11/14/2024 10:28 AM

## 2024-11-18 ENCOUNTER — Encounter: Payer: Self-pay | Admitting: Occupational Therapy

## 2024-11-18 ENCOUNTER — Ambulatory Visit: Admitting: Occupational Therapy

## 2024-11-18 DIAGNOSIS — I890 Lymphedema, not elsewhere classified: Secondary | ICD-10-CM

## 2024-11-18 NOTE — Therapy (Unsigned)
 OUTPATIENT OCCUPATIONAL THERAPY TREATMENT NOTE  RIGHT LOWER EXTREMITY/  RIGHT LOWER QUADRANT  LYMPHEDEMA  Patient Name: Danielle Aguilar MRN: [id number] DOB:August 09, 1986, 38 y.o., female Today's Date: 11/19/2024  END OF SESSION:  OT End of Session - 11/19/24 0919     Visit Number 5    Number of Visits 36    Date for Recertification  01/26/25    OT Start Time 0308    OT Stop Time 0412    OT Time Calculation (min) 64 min    Activity Tolerance Patient tolerated treatment well;No increased pain;Other (comment)   CP with R hemiplegia   Behavior During Therapy WFL for tasks assessed/performed            Past Medical History:  Diagnosis Date   Diabetes mellitus without complication (HCC)    History reviewed. No pertinent surgical history. Patient Active Problem List   Diagnosis Date Noted   Allergic rhinitis 02/23/2022   Cerebral palsy (HCC) 02/23/2022   Chronic kidney disease, stage 1 02/23/2022   Diabetic renal disease (HCC) 02/23/2022   Gastro-esophageal reflux disease without esophagitis 02/23/2022   Hemiplegia of dominant side (HCC) 02/23/2022   Morbid obesity (HCC) 02/23/2022   Pure hypercholesterolemia 02/23/2022   Type 2 diabetes mellitus with diabetic nephropathy (HCC) 02/23/2022   Uncontrolled type 2 diabetes mellitus with hyperglycemia (HCC) 08/14/2018    PCP: Velna Barrow, MD  REFERRING PROVIDER: same  REFERRING DIAG: I89.0  THERAPY DIAG:  Lymphedema, not elsewhere classified  Rationale for Evaluation and Treatment: Rehabilitation  ONSET DATE: several years ago. No known precipitating event  SUBJECTIVE:                                                                                                                                                                                           SUBJECTIVE STATEMENT: Danielle Aguilar presents to OT for lymphedema care to bilateral legs, with  RLE/RLQ> L. Pt is accompanied today by her sister Danielle Aguilar from [place].  Pt  reports LE related leg pain is 0/10. Pt reports she had difficulty reapplying Velcro style compression wrap created at last session due to hemiplegia. I tried , but I just couldn't get it tight enough.   (10/28/24 Initial OT Eval: Danielle Aguilar is referred to Occupational Therapy by [name], MD, for evaluation and treatment of BLE lymphedema. Pt. Is accompanied by her mother, Danielle Aguilar. Pt ambulates to clinic with ataxic gait using extra time. She reports the pain, redness, and swelling in her right leg and foot worsened a lot during a recent first-ever episode of RLE cellulitis,  which required oral antibiotics Keflex and doxycycline. Since the  cellulitis ~ 2 months ago Pt applies skin lotion ( brand unknown) 2 x daily, and applies a large gob of Vaseline at night with clean calf-length socks on top. Pt denies lymphorrhea. Pt states she has had leg swelling for a long time. She does not know exactly when it started. )  PERTINENT HISTORY:  Chronic venous stasis dermatitis,  10/03/24 BLE Doppler ruled out DVT Uncontrolled DM Type 2 CP w R hemiplegia Myopia Diabetic renal disease Obesity (BMI 43.1) BLE Lymphedema Recent cellulitis R Leg  PAIN:  Are you having pain? Yes, 6/10 numerically; heavy, itchy, aching Decreases: unknown Increases: standing, walking, sitting  PRECAUTIONS: Fall and Other: LYMPHEDEMA: skin precautions 2/2 DM  RED FLAGS: None   WEIGHT BEARING RESTRICTIONS: No  FALLS:  Has patient fallen in last 6 months? TBA  LIVING ENVIRONMENT: Lives with: lives alone Lives in: House/apartment Stairs: TBA Has following equipment at home: None  OCCUPATION: Works part time at [employer]- requires standing and walking  EDUCATION: HS  LEISURE: friends and family time  HAND DOMINANCE: left   PRIOR LEVEL OF FUNCTION: extra time for functional ambulation and transfers, needs AD for self care  PATIENT GOALS: get swelling down and limit recurrence of  cellulitis  OBJECTIVE: Moderate, BLE, Stage  II Lymphedema 2/2 suspected CVI and Obesity, R>L  Note: Objective measures were completed at Evaluation unless otherwise noted.  COGNITION:  Overall cognitive status: difficult to assess. Some difficulty with medical hx. Asks excellent questions. Fully participates in eval with appropriate affect. Processing speed slightly delayed.  OBSERVATIONS / OTHER ASSESSMENTS:  POSTURE: R hemiplegia; random, uncontrolled, jerky movements of R upper extremity, head and trunk  LE ROM: N/T due to spasticity.  Limited ability bend to over to reach feet and distal legs with hands   LYMPHEDEMA ASSESSMENTS:   SURGERY TYPE/DATE: N/A  NUMBER OF LYMPH NODES REMOVED: N/A  CHEMOTHERAPY: N/A  RADIATION:N/A  HORMONE TREATMENT: N/A  INFECTIONS: s/p R leg / foot cellulitis . Rx with oral Keflex and doxycycline   BLE COMPARATIVE LIMB VOLUMETRICS INITIAL 11/04/24  LANDMARK RIGHT    R LEG (A-D) 6737.9 ml  R THIGH (E-G) ml  R FULL LIMB (A-G) ml  Limb Volume differential (LVD)  LVD of LEGS measures 18%, L>R. Typically limb volume differential favors the dominant limb by 3-4 %.  Volume change since initial %  Volume change overall V  (Blank rows = not tested)  LANDMARK LEFT  (dominant)  L LEG (A-D) 5527.9 ml  L THIGH (E-G) ml  L FULL LIMB (A-G) ml  Limb Volume differential (LVD)  %  Volume change since initial %  Volume change overall %  (Blank rows = not tested)    SKIN CONDITION/TISSUE INTEGRITY:  Skin  Description Hyper- Keratosis Peau d d'Orange Shiny Tight Fibrotic/ Indurated Fatty Doughy Spongy/ boggy    Fibrotic skin thickening x  x x       Skin dry Flaky WNL Macerated Crusty Exudate   x x   Dry, cracked and scaly with yellowish discharge crusty plaque   Color Redness Varicosities Blanching Hemosiderin Stain Mottled   x  x   x   Odor Malodorous Yeast Fungal infection  WNL      x   Temperature Warm Cool wnl   x      Pitting  Edema   1+ 2+ 3+ 4+ Non-pitting         x   Girth Symmetrical Asymmetrical  Distribution    R>L toes to groin on R; toes to popliteal on L    Stemmer Sign Positive Negative   Strong +    Lymphorrhea History Of:  Present Absent   x      Wounds History Of Present Absent Venous Arterial Pressure Sheer   Unknown  x        Signs of Infection Redness Warmth Erythema Acute  Swelling Drainage Borders   x x x Fluctuating x             Sensation Light Touch Deep pressure Hypersensitivity   In tact Impaired In tact Impaired Absent Impaired   Difficult to assess    x     Nails WNL   Fungus nail dystrophy     Severe, discolored    Hair Growth Symmetrical Asymmetrical   Difficult to assess    Skin Creases Base of toes  Ankles   Base of Fingers knees       Abdominal pannus Thigh Lobules  Face/neck   x x  x suspected      GAIT: Distance walked: ataxic Assistive device utilized: None Level of assistance: Modified independence Comments: achilles sx as a child  LYMPHEDEMA LIFE IMPACT SCALE (LLIS): TBA initial Rx visit (The extent to which LE-related problems interfered with your life  over the last week)                                                                                                                            TREATMENT this DATE:  Pt and family edu MLD to RLE Compression wrapping   PATIENT/ FAMILY EDUCATION:  Continued Pt/ CG edu for lymphedema self care home program throughout session. Topics include outcome of comparative limb volumetrics- starting limb volume differentials (LVDs), technology and gradient techniques used for short stretch, multilayer compression wrapping, simple self-MLD, therapeutic lymphatic pumping exercises, skin/nail care, LE precautions, compression garment recommendations and specifications, wear and care schedule and compression garment donning / doffing w assistive devices. Discussed progress towards all OT  goals since commencing CDT. Discussed detrimental impact of obesity on lower and upper extremity lymphedema over time. Reviewed OT goals for lymphedema care with Pt and discussed progress to date.  All questions answered to the Pt's satisfaction. Good return. Person educated: Patient  Education method: Explanation, Demonstration, and Handouts Education comprehension: verbalized understanding, returned demonstration, verbal cues required, and needs further education  LYMPHEDEMA SELF-CARE HOME PROGRAM: BLE lymphatic pumping there ex- 1 set of 10 each element, in order. Hold 5. 2 x daily  Daily, short stretch, thigh or knee length, multilayer compression bandages to one leg at a time during Intensive Phase CDT   During self-management Phase of CDT fit Pt with BLE custom compression garments and HOS devices bilaterally to control swelling. Custom-made gradient compression garments and HOS devices are medically necessary because they are uniquely sized and shaped to fit the exact dimensions of the affected extremities, and to provide  appropriate medical grade, graduated compression essential for optimally managing chronic, progressive lymphedema. Multiple custom compression garments are needed to ensure proper hygiene to limit infection risk. Custom compression garments should be replaced q 3-6 months When worn consistently for optimal lipo-lymphedema self-management over time. HOS devices, medically necessary to limit fibrosis buildup in tissue, should be replaced q 2 years and PRN when worn out.    4. Consider trial of advanced, sequential, pneumatic , compression device, Flexitouch, for optimal LE self management of LE at home over time.  5. Daily simple self MLD used in conjunction with pneumatic device to engage thoracic duct return and to stimulate the terminus LNs in subclavian region.  6. Daily skin care with low ph lotion matching skin ph to reduce infection risk.  7. Leg elevation when seated     ASSESSMENT:  CLINICAL IMPRESSION: Pt presenting today with adjustable, Velcro wrap style, compression legging in place, but garment is upside down with tighter contact below the knee and minimal contact at the ankle. This incorrect placement slows lymphatic and blood circulation, which is already impaired, and exacerbates swelling below the knee.  OT marked garment alternative with Top and Bottom to assist Pt with donning  in correct orientation when she tries again.   CircAid garment is also quite damp. Upon assessment of skin moderate lymphorrhea is obvious and skin is quite macerated over the entire ulcerated area. Serosanguinous fluid is obvious on the stockinett. Stockinett is stuck to parts of the skin ulcer, but releases when dampening this with water and allowing it to sit for a few minutes. Pt has a history of recurrent cellulitis and starts a new course of oral antibiotic today by report. Before applying multi layer, short stretch, gradient compression wraps issued previously, OT bathed skin with mild soap and water and dried it    gently. We applied two 3 x 4 Telfa non-stick pads over ulcerated area under clean and dry stockinett  to limit additional damage to skin by sticking fabric wraps. Applied Rosidal foam layer circumferentially , overlapping ~ 1 , from base of toes to popliteal fossa. On top of foam applied one each 8 and 10 cm x 5 meter long, and two 12 cm w x 5 m long short stretch bandages using gradient techniques and ending below the knee.  Throughout session Pt and family were educated on essential nature of daily compression and need for diligent daily skin care for both diabetes and lymphedema management and infection prevention. Pt is unable to apply compression wraps or modified compression garment alternative without assistance, and these are critical for progress towards lymphedema management goals. Pt and family agree that without daily caregiver assistance Pt's  prognosis is poor and skin breakdown and lymphedema will worsen and progress, leading to further functional decline. Pt and her sister agree to notify the referring physician and request a referral to the wound clinic ASAP. Pt will discuss  these issues with family and try to identify someone who is able to  learn wrapping techniques and consistently assist Pt between sessions.Until caregiver is identified we'll postpone OT for CDT. Pt agrees to all or email tomorrow with updates.   (11/04/24 INITIAL OT EVAL: Danielle Aguilar is a 38 yo female presenting with moderate, stage II, BLE/BLQ lymphedema secondary to suspected venous insufficiency, obesity and trauma resulting from cellulitis. Chronic, progressive limb swelling and associated pain is distributed from toes to groin on the R and below the knee on the left. Lymphedema occurs secondary  to trauma when the injury damages lymphatic vessels and structures in the area, leading to a buildup of protein-rich, extra-cellular fluid carrying cellular debris, waste products, bacteria, viruses, and damaged or abnormal cells in the tissues causing swelling, inflammation and pain in the affected body part. Secondary lymphedema arising from significant soft tissue trauma, like lymphedema originating from other issues, is known to delay wound healing and contribute to infection risk, as well as balance problems. Thus, unchecked lymphedema significantly impacts Pt's with comorbid diabetes and it's complications.    Chronic, progressive lymphedema contributes to functional limitans in all occupational domains, including functional ambulation and mobility, basic and instrumental ADLs, productive activities, leisure pursuits, and social participation. Deformity related to chronic swelling and tissue changes also has a negative impact on body image, self esteem and quality of life. BLE lymphedema contributes to elevated infection risk and increased fall risk.  Ms. Sprunger will  benefit from skilled OT Complete Decongestive Therapy (CDT) the gold standard of lymphedema care, typically includes manual lymphatic drainage (MLD), skin care to limit' infection risk and increase skin excursion, lymphatic pumping exercise, and during the Intensive Phase multilayer, gradient compression bandaging to reduce limb volume. The goal of Intensive Phase CDT is to reduce swelling, reduce infection risk, limit progression,  and master  lymphedema self-care. As Pt transitions to the Self Management Phase of CDT Pt will be fit with appropriate compression garments and/ or devices and will complete a trial of the advanced, sequential, pneumatic, Flexitouch compression device for optimal, long term lymphedema management at home. Without skilled OT for lymphedema care, Pt's condition will progress, and further functional decline is expected.  REHAB POTENTIAL: Pt's prognosis for reducing limb volumes and limiting progression of chronic, progressive lymphedema is poor without daily caregiver assistance with compression during the Intensive Phase of CDT. Lymphedema treatment has a high burden of care. Due to spasticity, limited hip AROM, and impaired hand function Ms. Cranor requires daily assistance with application of multilayer, short stretch, knee length, compression bandages  to reduce limb volumes.   With daily caregiver assistance during the Intensive Phase of CDT Pt's prognosis for improvement in her condition is good. With modification and ADL training she may be able to don/ doff and gauge alternative , Velcro,  wrap style , compression leggings .    OBJECTIVE IMPAIRMENTS: Abnormal gait, decreased balance, decreased coordination, decreased knowledge of condition, decreased knowledge of use of DME, decreased mobility, difficulty walking, decreased ROM, increased edema, increased fascial restrictions, increased muscle spasms, impaired flexibility, impaired sensation, impaired tone, impaired UE  functional use, impaired vision/preception, postural dysfunction, obesity, pain, and chronic BLE swelling with permanent skin changes and related pain.   ACTIVITY LIMITATIONS: Functional ambulation and mobility ( spasticity and limb heaviness 2/2 lymphedema limits ability to walk, perform transfers, climb curbs and stairs, carry, squat) basic and instrumental ADLS ( fit she's and lower body clothing, bathe lower body, stand and walk to perform self care and home management activities , productive activities at work, leisure pursuits and social participation in the community.    PERSONAL FACTORS: Age, Fitness, Past/current experiences, 3+ comorbidities: CP, obesity, diabetes, and limited caregiver support are also affecting patient's functional outcome. Lymphedema has a high burden of self-care.  REHAB POTENTIAL: Pt's prognosis for reducing limb volumes and limiting progression of chronic, progressive lymphedema is poor without daily caregiver assistance with compression during the Intensive Phase of CDT. Lymphedema treatment has a high burden of care. Due to spasticity, limited hip AROM, and impaired hand  function Ms. Ang requires daily assistance with application of multilayer, short stretch, knee length, compression bandages  to reduce limb volumes.   With daily caregiver assistance during the Intensive Phase of CDT Pt's prognosis for improvement in her condition is good. With modification and ADL training she may be able to don/ doff and gauge alternative , Velcro,  wrap style , compression leggings .    EVALUATION COMPLEXITY: Moderate   SHORT TERM GOALS: Target date: 4th OT Rx visit   Pt will demonstrate understanding of lymphedema precautions and prevention strategies with moderate assistance using a printed reference to identify at least 5 precautions and discussing how s/he may implement them into daily life to reduce risk of progression with extra time. Baseline: Dependent Goal status:  INITIAL  2.  Pt will be able to apply multilayer, thigh length, gradient, compression wraps to one leg at a time from toes to groin with max caregiver assist to decrease limb volume, to limit infection risk, and to limit lymphedema progression.  Baseline: Dependent Goal status: INITIAL  LONG TERM GOALS: Target date: 01/26/25  1.Given this patient's Intake score of TBA % on the Lymphedema Life Impact Scale (LLIS), patient will experience a reduction of at least 5 points in her perceived level of functional impairment resulting from lymphedema to improve functional performance and quality of life (QOL). Baseline: TBA % Goal status: INITIAL  2.  Pt will achieve at least a 10% volume reduction in BLE to return limbs to typical size and shape, to limit infection risk and LE progression, to decrease pain, to improve function. Baseline: Dependent Goal status: INITIAL  3.  Pt will obtain appropriate compression garments/devices and achieve modified independence (extra time + assistive devices) with donning/doffing to optimize limb volume reductions and limit LE progression over time. Baseline: Dependent Goal status: INITIAL  4. During Intensive phase CDT, with modified independence, Pt will achieve at least 85% compliance with all adapted lymphedema self-care home program components, including daily skin care, compression wraps and /or garments, simple self MLD and lymphatic pumping therex to habituate LE self care protocol  into ADLs for optimal LE self-management over time. Baseline: Dependent Goal status: INITIAL  Pt will be able to don and doff Flexitouch advanced sequential compression device garments correctly and operate device according to therapist determined program within   2 weeks of receiving device and undergoing manufacturer's training.  Baseline: Dependent Goal status: INITIAL  PLAN:  OT FREQUENCY: 2x/week  OT DURATION: 12-18  weeks due to bilaterality and severity. We treat  one limb at a time only to limit fall risk.  PLANNED INTERVENTIONS: Complete Decongestive Therapy (CDT) to one leg a time , to include 97110-Therapeutic exercises, 97530- Therapeutic activity, 97535- Self Care, [id number]- Manual therapy, Patient/Family education, Manual lymph drainage, Compression bandaging and fitting with accessible, comfortable ,effective , day and nighttime compression garments, DME instructions, and skin care to reduce infection risk  PLAN FOR NEXT SESSION:  BLE comparative limb volumetrics Commence teaching caregiver to apply compression wraps Teach patient to direct a caregiver   Zebedee Dec, MS, OTR/L, CLT-LANA 11/19/2024 9:19 AM

## 2024-11-20 ENCOUNTER — Ambulatory Visit: Admitting: Occupational Therapy

## 2024-11-20 ENCOUNTER — Encounter: Payer: Self-pay | Admitting: Occupational Therapy

## 2024-11-20 DIAGNOSIS — I890 Lymphedema, not elsewhere classified: Secondary | ICD-10-CM

## 2024-11-20 NOTE — Therapy (Signed)
 OUTPATIENT OCCUPATIONAL THERAPY TREATMENT NOTE  RIGHT LOWER EXTREMITY/  RIGHT LOWER QUADRANT  LYMPHEDEMA  Patient Name: Danielle Aguilar MRN: [id number] DOB:28-Jun-1986, 38 y.o., female Today's Date: 11/20/2024  END OF SESSION:  OT End of Session - 11/20/24 1113     Visit Number 6    Number of Visits 36    Date for Recertification  01/26/25    OT Start Time 1100    OT Stop Time 1210    OT Time Calculation (min) 70 min    Activity Tolerance Patient tolerated treatment well;No increased pain;Other (comment)   CP with R hemiplegia   Behavior During Therapy WFL for tasks assessed/performed            Past Medical History:  Diagnosis Date   Diabetes mellitus without complication (HCC)    History reviewed. No pertinent surgical history. Patient Active Problem List   Diagnosis Date Noted   Allergic rhinitis 02/23/2022   Cerebral palsy (HCC) 02/23/2022   Chronic kidney disease, stage 1 02/23/2022   Diabetic renal disease (HCC) 02/23/2022   Gastro-esophageal reflux disease without esophagitis 02/23/2022   Hemiplegia of dominant side (HCC) 02/23/2022   Morbid obesity (HCC) 02/23/2022   Pure hypercholesterolemia 02/23/2022   Type 2 diabetes mellitus with diabetic nephropathy (HCC) 02/23/2022   Uncontrolled type 2 diabetes mellitus with hyperglycemia (HCC) 08/14/2018    PCP: Velna Barrow, MD  REFERRING PROVIDER: same  REFERRING DIAG: I89.0  THERAPY DIAG:  Lymphedema, not elsewhere classified  Rationale for Evaluation and Treatment: Rehabilitation  ONSET DATE: several years ago. No known precipitating event  SUBJECTIVE:                                                                                                                                                                                           SUBJECTIVE STATEMENT: Danielle Aguilar presents to OT for lymphedema care to bilateral legs, with  RLE/RLQ> L. Pt is accompanied today by her sister [name] today.  Pt reports LE  related leg pain is 0/10. Pt presents with dry, multilayer compression wraps applied last session in place.   (10/28/24 Initial OT Eval: Danielle Aguilar is referred to Occupational Therapy by [name], MD, for evaluation and treatment of BLE lymphedema. Pt. Is accompanied by her mother, [name]. Pt ambulates to clinic with ataxic gait using extra time. She reports the pain, redness, and swelling in her right leg and foot worsened a lot during a recent first-ever episode of RLE cellulitis,  which required oral antibiotics Keflex and doxycycline. Since the cellulitis ~ 2 months ago Pt applies skin lotion ( brand unknown) 2 x daily, and applies  a large gob of Vaseline at night with clean calf-length socks on top. Pt denies lymphorrhea. Pt states she has had leg swelling for a long time. She does not know exactly when it started. )  PERTINENT HISTORY:  Chronic venous stasis dermatitis,  10/03/24 BLE Doppler ruled out DVT Uncontrolled DM Type 2 CP w R hemiplegia Myopia Diabetic renal disease Obesity (BMI 43.1) BLE Lymphedema Recent cellulitis R Leg  PAIN:  Are you having pain? Yes, 6/10 numerically; heavy, itchy, aching Decreases: unknown Increases: standing, walking, sitting  PRECAUTIONS: Fall and Other: LYMPHEDEMA: skin precautions 2/2 DM  RED FLAGS: None   WEIGHT BEARING RESTRICTIONS: No  FALLS:  Has patient fallen in last 6 months? TBA  LIVING ENVIRONMENT: Lives with: lives alone Lives in: House/apartment Stairs: TBA Has following equipment at home: None  OCCUPATION: Works part time at [employer]- requires standing and walking  EDUCATION: HS  LEISURE: friends and family time  HAND DOMINANCE: left   PRIOR LEVEL OF FUNCTION: extra time for functional ambulation and transfers, needs AD for self care  PATIENT GOALS: get swelling down and limit recurrence of cellulitis  OBJECTIVE: Moderate, BLE, Stage  II Lymphedema 2/2 suspected CVI and Obesity, R>L  Note: Objective  measures were completed at Evaluation unless otherwise noted.  COGNITION:  Overall cognitive status: difficult to assess. Some difficulty with medical hx. Asks excellent questions. Fully participates in eval with appropriate affect. Processing speed slightly delayed.  OBSERVATIONS / OTHER ASSESSMENTS:  POSTURE: R hemiplegia; random, uncontrolled, jerky movements of R upper extremity, head and trunk  LE ROM: N/T due to spasticity.  Limited ability bend to over to reach feet and distal legs with hands   LYMPHEDEMA ASSESSMENTS:   SURGERY TYPE/DATE: N/A  NUMBER OF LYMPH NODES REMOVED: N/A  CHEMOTHERAPY: N/A  RADIATION:N/A  HORMONE TREATMENT: N/A  INFECTIONS: s/p R leg / foot cellulitis . Rx with oral Keflex and doxycycline   BLE COMPARATIVE LIMB VOLUMETRICS INITIAL 11/04/24  LANDMARK RIGHT    R LEG (A-D) 6737.9 ml  R THIGH (E-G) ml  R FULL LIMB (A-G) ml  Limb Volume differential (LVD)  LVD of LEGS measures 18%, L>R. Typically limb volume differential favors the dominant limb by 3-4 %.  Volume change since initial %  Volume change overall V  (Blank rows = not tested)  LANDMARK LEFT  (dominant)  L LEG (A-D) 5527.9 ml  L THIGH (E-G) ml  L FULL LIMB (A-G) ml  Limb Volume differential (LVD)  %  Volume change since initial %  Volume change overall %  (Blank rows = not tested)    SKIN CONDITION/TISSUE INTEGRITY:  Skin  Description Hyper- Keratosis Peau d d'Orange Shiny Tight Fibrotic/ Indurated Fatty Doughy Spongy/ boggy    Fibrotic skin thickening x  x x       Skin dry Flaky WNL Macerated Crusty Exudate   x x   Dry, cracked and scaly with yellowish discharge crusty plaque   Color Redness Varicosities Blanching Hemosiderin Stain Mottled   x  x   x   Odor Malodorous Yeast Fungal infection  WNL      x   Temperature Warm Cool wnl   x      Pitting Edema   1+ 2+ 3+ 4+ Non-pitting         x   Girth Symmetrical Asymmetrical                   Distribution     R>L toes to  groin on R; toes to popliteal on L    Stemmer Sign Positive Negative   Strong +    Lymphorrhea History Of:  Present Absent   x      Wounds History Of Present Absent Venous Arterial Pressure Sheer   Unknown  x        Signs of Infection Redness Warmth Erythema Acute  Swelling Drainage Borders   x x x Fluctuating x             Sensation Light Touch Deep pressure Hypersensitivity   In tact Impaired In tact Impaired Absent Impaired   Difficult to assess    x     Nails WNL   Fungus nail dystrophy     Severe, discolored    Hair Growth Symmetrical Asymmetrical   Difficult to assess    Skin Creases Base of toes  Ankles   Base of Fingers knees       Abdominal pannus Thigh Lobules  Face/neck   x x  x suspected      GAIT: Distance walked: ataxic Assistive device utilized: None Level of assistance: Modified independence Comments: achilles sx as a child  LYMPHEDEMA LIFE IMPACT SCALE (LLIS): TBA initial Rx visit (The extent to which LE-related problems interfered with your life  over the last week)                                                                                                                            TREATMENT this DATE:  Pt and family edu MLD to RLE Compression wrapping   PATIENT/ FAMILY EDUCATION:  Continued Pt/ CG edu for lymphedema self care home program throughout session. Topics include outcome of comparative limb volumetrics- starting limb volume differentials (LVDs), technology and gradient techniques used for short stretch, multilayer compression wrapping, simple self-MLD, therapeutic lymphatic pumping exercises, skin/nail care, LE precautions, compression garment recommendations and specifications, wear and care schedule and compression garment donning / doffing w assistive devices. Discussed progress towards all OT goals since commencing CDT. Discussed detrimental impact of obesity on lower and upper extremity lymphedema over time.  Reviewed OT goals for lymphedema care with Pt and discussed progress to date.  All questions answered to the Pt's satisfaction. Good return. Person educated: Patient  Education method: Explanation, Demonstration, and Handouts Education comprehension: verbalized understanding, returned demonstration, verbal cues required, and needs further education  LYMPHEDEMA SELF-CARE HOME PROGRAM: BLE lymphatic pumping there ex- 1 set of 10 each element, in order. Hold 5. 2 x daily  Daily, short stretch, thigh or knee length, multilayer compression bandages to one leg at a time during Intensive Phase CDT   During self-management Phase of CDT fit Pt with BLE custom compression garments and HOS devices bilaterally to control swelling. Custom-made gradient compression garments and HOS devices are medically necessary because they are uniquely sized and shaped to fit the exact dimensions of the affected extremities, and to provide appropriate medical grade, graduated compression essential for  optimally managing chronic, progressive lymphedema. Multiple custom compression garments are needed to ensure proper hygiene to limit infection risk. Custom compression garments should be replaced q 3-6 months When worn consistently for optimal lipo-lymphedema self-management over time. HOS devices, medically necessary to limit fibrosis buildup in tissue, should be replaced q 2 years and PRN when worn out.    4. Consider trial of advanced, sequential, pneumatic , compression device, Flexitouch, for optimal LE self management of LE at home over time.  5. Daily simple self MLD used in conjunction with pneumatic device to engage thoracic duct return and to stimulate the terminus LNs in subclavian region.  6. Daily skin care with low ph lotion matching skin ph to reduce infection risk.  7. Leg elevation when seated    ASSESSMENT:  CLINICAL IMPRESSION: Pt tolerated multilayer compression wraps since last session without removing  them. When we removed them in clinic Pt experienced an immediate histamine reaction and was overwhelmed with an uncontrollable urge to  scratch her itching leg. Pt's sister, standing by her side, held her hands while telling her not to scratch. OT taught relaxation breathing techniques to help her through the episode.Pt was unable to resist the urge without max assistance. No doubt scratching the itching wound bed and very dry skin on the R leg contributes significantly to skin breakdown. OT educated Pt and family to use a terrycloth wash cloth instead of hands to gently rub skin when it itches. OT performed skin care with gentle MLD to R leg below the knee to increase skin hydration and reduce itching. The R leg demonstrates an excellent response to compression wrapping with reduced redness, obvious limb volume reduction, and resolution of lymphorrhea after only 2 days.   Made a video tutorial for caregivers on sister's smart phone while re-applying multilayer compression wraps using gradient techniques. Pt hopes to line up a few different caregivers to assist her with compression wrapping between OT sessions. Pt is working on scheduling a wound clinic appointment ASAP Added Friday to Pt's schedule this week to ensure wraps are changed before the weekend. Cont as per POC.    (11/04/24 INITIAL OT EVAL: Danielle Aguilar is a 38 yo female presenting with moderate, stage II, BLE/BLQ lymphedema secondary to suspected venous insufficiency, obesity and trauma resulting from cellulitis. Chronic, progressive limb swelling and associated pain is distributed from toes to groin on the R and below the knee on the left. Lymphedema occurs secondary to trauma when the injury damages lymphatic vessels and structures in the area, leading to a buildup of protein-rich, extra-cellular fluid carrying cellular debris, waste products, bacteria, viruses, and damaged or abnormal cells in the tissues causing swelling, inflammation and pain  in the affected body part. Secondary lymphedema arising from significant soft tissue trauma, like lymphedema originating from other issues, is known to delay wound healing and contribute to infection risk, as well as balance problems. Thus, unchecked lymphedema significantly impacts Pt's with comorbid diabetes and it's complications.    Chronic, progressive lymphedema contributes to functional limitans in all occupational domains, including functional ambulation and mobility, basic and instrumental ADLs, productive activities, leisure pursuits, and social participation. Deformity related to chronic swelling and tissue changes also has a negative impact on body image, self esteem and quality of life. BLE lymphedema contributes to elevated infection risk and increased fall risk.  Ms. Dragovich will benefit from skilled OT Complete Decongestive Therapy (CDT) the gold standard of lymphedema care, typically includes manual lymphatic drainage (MLD), skin care  to limit' infection risk and increase skin excursion, lymphatic pumping exercise, and during the Intensive Phase multilayer, gradient compression bandaging to reduce limb volume. The goal of Intensive Phase CDT is to reduce swelling, reduce infection risk, limit progression,  and master  lymphedema self-care. As Pt transitions to the Self Management Phase of CDT Pt will be fit with appropriate compression garments and/ or devices and will complete a trial of the advanced, sequential, pneumatic, Flexitouch compression device for optimal, long term lymphedema management at home. Without skilled OT for lymphedema care, Pt's condition will progress, and further functional decline is expected.  REHAB POTENTIAL: Pt's prognosis for reducing limb volumes and limiting progression of chronic, progressive lymphedema is poor without daily caregiver assistance with compression during the Intensive Phase of CDT. Lymphedema treatment has a high burden of care. Due to spasticity,  limited hip AROM, and impaired hand function Ms. Tersigni requires daily assistance with application of multilayer, short stretch, knee length, compression bandages  to reduce limb volumes.   With daily caregiver assistance during the Intensive Phase of CDT Pt's prognosis for improvement in her condition is good. With modification and ADL training she may be able to don/ doff and gauge alternative , Velcro,  wrap style , compression leggings .    OBJECTIVE IMPAIRMENTS: Abnormal gait, decreased balance, decreased coordination, decreased knowledge of condition, decreased knowledge of use of DME, decreased mobility, difficulty walking, decreased ROM, increased edema, increased fascial restrictions, increased muscle spasms, impaired flexibility, impaired sensation, impaired tone, impaired UE functional use, impaired vision/preception, postural dysfunction, obesity, pain, and chronic BLE swelling with permanent skin changes and related pain.   ACTIVITY LIMITATIONS: Functional ambulation and mobility ( spasticity and limb heaviness 2/2 lymphedema limits ability to walk, perform transfers, climb curbs and stairs, carry, squat) basic and instrumental ADLS ( fit she's and lower body clothing, bathe lower body, stand and walk to perform self care and home management activities , productive activities at work, leisure pursuits and social participation in the community.    PERSONAL FACTORS: Age, Fitness, Past/current experiences, 3+ comorbidities: CP, obesity, diabetes, and limited caregiver support are also affecting patient's functional outcome. Lymphedema has a high burden of self-care.  REHAB POTENTIAL: Pt's prognosis for reducing limb volumes and limiting progression of chronic, progressive lymphedema is poor without daily caregiver assistance with compression during the Intensive Phase of CDT. Lymphedema treatment has a high burden of care. Due to spasticity, limited hip AROM, and impaired hand function Ms. Zaucha  requires daily assistance with application of multilayer, short stretch, knee length, compression bandages  to reduce limb volumes.   With daily caregiver assistance during the Intensive Phase of CDT Pt's prognosis for improvement in her condition is good. With modification and ADL training she may be able to don/ doff and gauge alternative , Velcro,  wrap style , compression leggings .    EVALUATION COMPLEXITY: Moderate   SHORT TERM GOALS: Target date: 4th OT Rx visit   Pt will demonstrate understanding of lymphedema precautions and prevention strategies with moderate assistance using a printed reference to identify at least 5 precautions and discussing how s/he may implement them into daily life to reduce risk of progression with extra time. Baseline: Dependent Goal status: INITIAL  2.  Pt will be able to apply multilayer, thigh length, gradient, compression wraps to one leg at a time from toes to groin with max caregiver assist to decrease limb volume, to limit infection risk, and to limit lymphedema progression.  Baseline: Dependent Goal  status: INITIAL  LONG TERM GOALS: Target date: 01/26/25  1.Given this patient's Intake score of TBA % on the Lymphedema Life Impact Scale (LLIS), patient will experience a reduction of at least 5 points in her perceived level of functional impairment resulting from lymphedema to improve functional performance and quality of life (QOL). Baseline: TBA % Goal status: INITIAL  2.  Pt will achieve at least a 10% volume reduction in BLE to return limbs to typical size and shape, to limit infection risk and LE progression, to decrease pain, to improve function. Baseline: Dependent Goal status: INITIAL  3.  Pt will obtain appropriate compression garments/devices and achieve modified independence (extra time + assistive devices) with donning/doffing to optimize limb volume reductions and limit LE progression over time. Baseline: Dependent Goal status:  INITIAL  4. During Intensive phase CDT, with modified independence, Pt will achieve at least 85% compliance with all adapted lymphedema self-care home program components, including daily skin care, compression wraps and /or garments, simple self MLD and lymphatic pumping therex to habituate LE self care protocol  into ADLs for optimal LE self-management over time. Baseline: Dependent Goal status: INITIAL  Pt will be able to don and doff Flexitouch advanced sequential compression device garments correctly and operate device according to therapist determined program within   2 weeks of receiving device and undergoing manufacturer's training.  Baseline: Dependent Goal status: INITIAL  PLAN:  OT FREQUENCY: 2x/week  OT DURATION: 12-18  weeks due to bilaterality and severity. We treat one limb at a time only to limit fall risk.  PLANNED INTERVENTIONS: Complete Decongestive Therapy (CDT) to one leg a time , to include 97110-Therapeutic exercises, 97530- Therapeutic activity, 97535- Self Care, [id number]- Manual therapy, Patient/Family education, Manual lymph drainage, Compression bandaging and fitting with accessible, comfortable ,effective , day and nighttime compression garments, DME instructions, and skin care to reduce infection risk  PLAN FOR NEXT SESSION:  BLE comparative limb volumetrics Commence teaching caregiver to apply compression wraps Teach patient to direct a caregiver   Zebedee Dec, MS, OTR/L, CLT-LANA 11/20/2024 12:12 PM

## 2024-11-21 ENCOUNTER — Ambulatory Visit: Admitting: Podiatry

## 2024-11-22 ENCOUNTER — Ambulatory Visit: Admitting: Occupational Therapy

## 2024-11-22 DIAGNOSIS — I890 Lymphedema, not elsewhere classified: Secondary | ICD-10-CM

## 2024-11-22 NOTE — Therapy (Signed)
 " OUTPATIENT OCCUPATIONAL THERAPY TREATMENT NOTE  RIGHT LOWER EXTREMITY/  RIGHT LOWER QUADRANT  LYMPHEDEMA  Patient Name: Danielle Aguilar MRN: [id number] DOB:Aug 16, 1986, 38 y.o., female Today's Date: 11/22/2024  END OF SESSION:  OT End of Session - 11/22/24 1023     Visit Number 7    Number of Visits 36    Date for Recertification  01/26/25    OT Start Time 1007    OT Stop Time 1115    OT Time Calculation (min) 68 min    Activity Tolerance Patient tolerated treatment well;No increased pain;Other (comment)   CP with R hemiplegia   Behavior During Therapy WFL for tasks assessed/performed            Past Medical History:  Diagnosis Date   Diabetes mellitus without complication (HCC)    No past surgical history on file. Patient Active Problem List   Diagnosis Date Noted   Allergic rhinitis 02/23/2022   Cerebral palsy (HCC) 02/23/2022   Chronic kidney disease, stage 1 02/23/2022   Diabetic renal disease (HCC) 02/23/2022   Gastro-esophageal reflux disease without esophagitis 02/23/2022   Hemiplegia of dominant side (HCC) 02/23/2022   Morbid obesity (HCC) 02/23/2022   Pure hypercholesterolemia 02/23/2022   Type 2 diabetes mellitus with diabetic nephropathy (HCC) 02/23/2022   Uncontrolled type 2 diabetes mellitus with hyperglycemia (HCC) 08/14/2018    PCP: Danielle Barrow, MD  REFERRING PROVIDER: same  REFERRING DIAG: I89.0  THERAPY DIAG:  Lymphedema, not elsewhere classified  Rationale for Evaluation and Treatment: Rehabilitation  ONSET DATE: several years ago. No known precipitating event  SUBJECTIVE:                                                                                                                                                                                           SUBJECTIVE STATEMENT: Danielle Aguilar presents to OT for lymphedema care to bilateral legs, with  RLE/RLQ> L. Pt is accompanied today by her sister Danielle Aguilar and her mother today.  Pt reports LE  related leg pain is 0/10. Pt presents with dry, multilayer compression wraps applied last session in place. Pt reports itching has gotten a little better. She brings several fidget toys to clinic that she can use to distract herself when itching inside of wraps starts again. Mother reports she gave benadryl last night and Pt slept well.  (10/28/24 Initial OT Eval: Danielle Aguilar is referred to Occupational Therapy by Danielle Skeeter, MD, for evaluation and treatment of BLE lymphedema. Pt. Is accompanied by her mother, Danielle Aguilar. Pt ambulates to clinic with ataxic gait using extra time. She reports the pain, redness, and swelling in  her right leg and foot worsened a lot during a recent first-ever episode of RLE cellulitis,  which required oral antibiotics Keflex and doxycycline. Since the cellulitis ~ 2 months ago Pt applies skin lotion ( brand unknown) 2 x daily, and applies a large gob of Vaseline at night with clean calf-length socks on top. Pt denies lymphorrhea. Pt states she has had leg swelling for a long time. She does not know exactly when it started. )  PERTINENT HISTORY:  Chronic venous stasis dermatitis,  10/03/24 BLE Doppler ruled out DVT Uncontrolled DM Type 2 CP w R hemiplegia Myopia Diabetic renal disease Obesity (BMI 43.1) BLE Lymphedema Recent cellulitis R Leg  PAIN:  Are you having pain? Yes, 6/10 numerically; heavy, itchy, aching Decreases: unknown Increases: standing, walking, sitting  PRECAUTIONS: Fall and Other: LYMPHEDEMA: skin precautions 2/2 DM  RED FLAGS: None   WEIGHT BEARING RESTRICTIONS: No  FALLS:  Has patient fallen in last 6 months? TBA  LIVING ENVIRONMENT: Lives with: lives alone Lives in: House/apartment Stairs: TBA Has following equipment at home: None  OCCUPATION: Works part time at [employer]- requires standing and walking  EDUCATION: HS  LEISURE: friends and family time  HAND DOMINANCE: left   PRIOR LEVEL OF FUNCTION: extra time for  functional ambulation and transfers, needs AD for self care  PATIENT GOALS: get swelling down and limit recurrence of cellulitis  OBJECTIVE: Moderate, BLE, Stage  II Lymphedema 2/2 suspected CVI and Obesity, R>L  Note: Objective measures were completed at Evaluation unless otherwise noted.  COGNITION:  Overall cognitive status: difficult to assess. Some difficulty with medical hx. Asks excellent questions. Fully participates in eval with appropriate affect. Processing speed slightly delayed.  OBSERVATIONS / OTHER ASSESSMENTS:  POSTURE: R hemiplegia; random, uncontrolled, jerky movements of R upper extremity, head and trunk  LE ROM: N/T due to spasticity.  Limited ability bend to over to reach feet and distal legs with hands   LYMPHEDEMA ASSESSMENTS:   SURGERY TYPE/DATE: N/A  NUMBER OF LYMPH NODES REMOVED: N/A  CHEMOTHERAPY: N/A  RADIATION:N/A  HORMONE TREATMENT: N/A  INFECTIONS: s/p R leg / foot cellulitis . Rx with oral Keflex and doxycycline   BLE COMPARATIVE LIMB VOLUMETRICS INITIAL 11/04/24  LANDMARK RIGHT    R LEG (A-D) 6737.9 ml  R THIGH (E-G) ml  R FULL LIMB (A-G) ml  Limb Volume differential (LVD)  LVD of LEGS measures 18%, L>R. Typically limb volume differential favors the dominant limb by 3-4 %.  Volume change since initial %  Volume change overall V  (Blank rows = not tested)  LANDMARK LEFT  (dominant)  L LEG (A-D) 5527.9 ml  L THIGH (E-G) ml  L FULL LIMB (A-G) ml  Limb Volume differential (LVD)  %  Volume change since initial %  Volume change overall %  (Blank rows = not tested)    SKIN CONDITION/TISSUE INTEGRITY:  Skin  Description Hyper- Keratosis Peau d d'Orange Shiny Tight Fibrotic/ Indurated Fatty Doughy Spongy/ boggy    Fibrotic skin thickening x  x x       Skin dry Flaky WNL Macerated Crusty Exudate   x x   Dry, cracked and scaly with yellowish discharge crusty plaque   Color Redness Varicosities Blanching Hemosiderin Stain Mottled   x   x   x   Odor Malodorous Yeast Fungal infection  WNL      x   Temperature Warm Cool wnl   x      Pitting Edema   1+  2+ 3+ 4+ Non-pitting         x   Girth Symmetrical Asymmetrical                   Distribution    R>L toes to groin on R; toes to popliteal on L    Stemmer Sign Positive Negative   Strong +    Lymphorrhea History Of:  Present Absent   x      Wounds History Of Present Absent Venous Arterial Pressure Sheer   Unknown  x        Signs of Infection Redness Warmth Erythema Acute  Swelling Drainage Borders   x x x Fluctuating x             Sensation Light Touch Deep pressure Hypersensitivity   In tact Impaired In tact Impaired Absent Impaired   Difficult to assess    x     Nails WNL   Fungus nail dystrophy     Severe, discolored    Hair Growth Symmetrical Asymmetrical   Difficult to assess    Skin Creases Base of toes  Ankles   Base of Fingers knees       Abdominal pannus Thigh Lobules  Face/neck   x x  x suspected      GAIT: Distance walked: ataxic Assistive device utilized: None Level of assistance: Modified independence Comments: achilles sx as a child  LYMPHEDEMA LIFE IMPACT SCALE (LLIS): TBA initial Rx visit (The extent to which LE-related problems interfered with your life  over the last week)                                                                                                                            TREATMENT this DATE:  Pt and family edu MLD to RLE w simultaneous skin care Compression wrapping   PATIENT/ FAMILY EDUCATION:  Continued Pt/ CG edu for lymphedema self care home program throughout session. Topics include outcome of comparative limb volumetrics- starting limb volume differentials (LVDs), technology and gradient techniques used for short stretch, multilayer compression wrapping, simple self-MLD, therapeutic lymphatic pumping exercises, skin/nail care, LE precautions, compression garment recommendations and  specifications, wear and care schedule and compression garment donning / doffing w assistive devices. Discussed progress towards all OT goals since commencing CDT. Discussed detrimental impact of obesity on lower and upper extremity lymphedema over time. Reviewed OT goals for lymphedema care with Pt and discussed progress to date.  All questions answered to the Pt's satisfaction. Good return. Person educated: Patient  Education method: Explanation, Demonstration, and Handouts Education comprehension: verbalized understanding, returned demonstration, verbal cues required, and needs further education  LYMPHEDEMA SELF-CARE HOME PROGRAM: BLE lymphatic pumping there ex- 1 set of 10 each element, in order. Hold 5. 2 x daily  Daily, short stretch, thigh or knee length, multilayer compression bandages to one leg at a time during Intensive Phase CDT   During self-management Phase of CDT fit Pt with  BLE custom compression garments and HOS devices bilaterally to control swelling. Custom-made gradient compression garments and HOS devices are medically necessary because they are uniquely sized and shaped to fit the exact dimensions of the affected extremities, and to provide appropriate medical grade, graduated compression essential for optimally managing chronic, progressive lymphedema. Multiple custom compression garments are needed to ensure proper hygiene to limit infection risk. Custom compression garments should be replaced q 3-6 months When worn consistently for optimal lipo-lymphedema self-management over time. HOS devices, medically necessary to limit fibrosis buildup in tissue, should be replaced q 2 years and PRN when worn out.    4. Consider trial of advanced, sequential, pneumatic , compression device, Flexitouch, for optimal LE self management of LE at home over time.  5. Daily simple self MLD used in conjunction with pneumatic device to engage thoracic duct return and to stimulate the terminus LNs in  subclavian region.  6. Daily skin care with low ph lotion matching skin ph to reduce infection risk.  7. Leg elevation when seated    ASSESSMENT:  CLINICAL IMPRESSION: Pt tolerated multilayer compression wraps since last session without removing them. When we removed them in clinic Pt experienced an immediate histamine reaction, but nowhere near as intense and uncontrollable sd last time. Pt's sister, standing by her side guiding her in relaxation breathing and encouraging self control. These techniques worked well and Pt was able to resist.  OT performed skin care with gentle MLD to R leg below the knee to increase skin hydration and reduce itching. The R leg continues to demonstrate an excellent response to compression wrapping with reduced redness, obvious limb volume reduction, and resolution of lymphorrhea after only 2 days. Pt has daily assistance with bandaging with family members scheduled throughout the holidays. We'll see her when we return from out 2 week vacation. Cont as per POC.   (11/04/24 INITIAL OT EVAL: Danielle Aguilar is a 38 yo female presenting with moderate, stage II, BLE/BLQ lymphedema secondary to suspected venous insufficiency, obesity and trauma resulting from cellulitis. Chronic, progressive limb swelling and associated pain is distributed from toes to groin on the R and below the knee on the left. Lymphedema occurs secondary to trauma when the injury damages lymphatic vessels and structures in the area, leading to a buildup of protein-rich, extra-cellular fluid carrying cellular debris, waste products, bacteria, viruses, and damaged or abnormal cells in the tissues causing swelling, inflammation and pain in the affected body part. Secondary lymphedema arising from significant soft tissue trauma, like lymphedema originating from other issues, is known to delay wound healing and contribute to infection risk, as well as balance problems. Thus, unchecked lymphedema significantly  impacts Pt's with comorbid diabetes and it's complications.    Chronic, progressive lymphedema contributes to functional limitans in all occupational domains, including functional ambulation and mobility, basic and instrumental ADLs, productive activities, leisure pursuits, and social participation. Deformity related to chronic swelling and tissue changes also has a negative impact on body image, self esteem and quality of life. BLE lymphedema contributes to elevated infection risk and increased fall risk.  Danielle Aguilar will benefit from skilled OT Complete Decongestive Therapy (CDT) the gold standard of lymphedema care, typically includes manual lymphatic drainage (MLD), skin care to limit' infection risk and increase skin excursion, lymphatic pumping exercise, and during the Intensive Phase multilayer, gradient compression bandaging to reduce limb volume. The goal of Intensive Phase CDT is to reduce swelling, reduce infection risk, limit progression,  and master  lymphedema  self-care. As Pt transitions to the Self Management Phase of CDT Pt will be fit with appropriate compression garments and/ or devices and will complete a trial of the advanced, sequential, pneumatic, Flexitouch compression device for optimal, long term lymphedema management at home. Without skilled OT for lymphedema care, Pt's condition will progress, and further functional decline is expected.  REHAB POTENTIAL: Pt's prognosis for reducing limb volumes and limiting progression of chronic, progressive lymphedema is poor without daily caregiver assistance with compression during the Intensive Phase of CDT. Lymphedema treatment has a high burden of care. Due to spasticity, limited hip AROM, and impaired hand function Ms. Stachnik requires daily assistance with application of multilayer, short stretch, knee length, compression bandages  to reduce limb volumes.   With daily caregiver assistance during the Intensive Phase of CDT Pt's prognosis for  improvement in her condition is good. With modification and ADL training she may be able to don/ doff and gauge alternative , Velcro,  wrap style , compression leggings .    OBJECTIVE IMPAIRMENTS: Abnormal gait, decreased balance, decreased coordination, decreased knowledge of condition, decreased knowledge of use of DME, decreased mobility, difficulty walking, decreased ROM, increased edema, increased fascial restrictions, increased muscle spasms, impaired flexibility, impaired sensation, impaired tone, impaired UE functional use, impaired vision/preception, postural dysfunction, obesity, pain, and chronic BLE swelling with permanent skin changes and related pain.   ACTIVITY LIMITATIONS: Functional ambulation and mobility ( spasticity and limb heaviness 2/2 lymphedema limits ability to walk, perform transfers, climb curbs and stairs, carry, squat) basic and instrumental ADLS ( fit she's and lower body clothing, bathe lower body, stand and walk to perform self care and home management activities , productive activities at work, leisure pursuits and social participation in the community.    PERSONAL FACTORS: Age, Fitness, Past/current experiences, 3+ comorbidities: CP, obesity, diabetes, and limited caregiver support are also affecting patient's functional outcome. Lymphedema has a high burden of self-care.  REHAB POTENTIAL: Pt's prognosis for reducing limb volumes and limiting progression of chronic, progressive lymphedema is poor without daily caregiver assistance with compression during the Intensive Phase of CDT. Lymphedema treatment has a high burden of care. Due to spasticity, limited hip AROM, and impaired hand function Danielle Aguilar requires daily assistance with application of multilayer, short stretch, knee length, compression bandages  to reduce limb volumes.   With daily caregiver assistance during the Intensive Phase of CDT Pt's prognosis for improvement in her condition is good. With  modification and ADL training she may be able to don/ doff and gauge alternative , Velcro,  wrap style , compression leggings .    EVALUATION COMPLEXITY: Moderate   SHORT TERM GOALS: Target date: 4th OT Rx visit   Pt will demonstrate understanding of lymphedema precautions and prevention strategies with moderate assistance using a printed reference to identify at least 5 precautions and discussing how s/he may implement them into daily life to reduce risk of progression with extra time. Baseline: Dependent Goal status: INITIAL  2.  Pt will be able to apply multilayer, thigh length, gradient, compression wraps to one leg at a time from toes to groin with max caregiver assist to decrease limb volume, to limit infection risk, and to limit lymphedema progression.  Baseline: Dependent Goal status: INITIAL  LONG TERM GOALS: Target date: 01/26/25  1.Given this patient's Intake score of TBA % on the Lymphedema Life Impact Scale (LLIS), patient will experience a reduction of at least 5 points in her perceived level of functional impairment resulting from  lymphedema to improve functional performance and quality of life (QOL). Baseline: TBA % Goal status: INITIAL  2.  Pt will achieve at least a 10% volume reduction in BLE to return limbs to typical size and shape, to limit infection risk and LE progression, to decrease pain, to improve function. Baseline: Dependent Goal status: INITIAL  3.  Pt will obtain appropriate compression garments/devices and achieve modified independence (extra time + assistive devices) with donning/doffing to optimize limb volume reductions and limit LE progression over time. Baseline: Dependent Goal status: INITIAL  4. During Intensive phase CDT, with modified independence, Pt will achieve at least 85% compliance with all adapted lymphedema self-care home program components, including daily skin care, compression wraps and /or garments, simple self MLD and lymphatic  pumping therex to habituate LE self care protocol  into ADLs for optimal LE self-management over time. Baseline: Dependent Goal status: INITIAL  Pt will be able to don and doff Flexitouch advanced sequential compression device garments correctly and operate device according to therapist determined program within   2 weeks of receiving device and undergoing manufacturer's training.  Baseline: Dependent Goal status: INITIAL  PLAN:  OT FREQUENCY: 2x/week  OT DURATION: 12-18  weeks due to bilaterality and severity. We treat one limb at a time only to limit fall risk.  PLANNED INTERVENTIONS: Complete Decongestive Therapy (CDT) to one leg a time , to include 97110-Therapeutic exercises, 97530- Therapeutic activity, 97535- Self Care, [id number]- Manual therapy, Patient/Family education, Manual lymph drainage, Compression bandaging and fitting with accessible, comfortable ,effective , day and nighttime compression garments, DME instructions, and skin care to reduce infection risk  PLAN FOR NEXT SESSION:  BLE comparative limb volumetrics Commence teaching caregiver to apply compression wraps Teach patient to direct a caregiver   Zebedee Dec, MS, OTR/L, CLT-LANA 11/22/2024 11:59 AM  "

## 2024-11-26 DIAGNOSIS — D500 Iron deficiency anemia secondary to blood loss (chronic): Secondary | ICD-10-CM | POA: Diagnosis not present

## 2024-11-26 DIAGNOSIS — E1165 Type 2 diabetes mellitus with hyperglycemia: Secondary | ICD-10-CM | POA: Diagnosis not present

## 2024-11-26 DIAGNOSIS — Z6841 Body Mass Index (BMI) 40.0 and over, adult: Secondary | ICD-10-CM | POA: Diagnosis not present

## 2024-11-26 DIAGNOSIS — E7800 Pure hypercholesterolemia, unspecified: Secondary | ICD-10-CM | POA: Diagnosis not present

## 2024-12-09 ENCOUNTER — Ambulatory Visit: Attending: Internal Medicine | Admitting: Occupational Therapy

## 2024-12-09 ENCOUNTER — Encounter: Payer: Self-pay | Admitting: Occupational Therapy

## 2024-12-09 DIAGNOSIS — I890 Lymphedema, not elsewhere classified: Secondary | ICD-10-CM | POA: Insufficient documentation

## 2024-12-09 NOTE — Therapy (Signed)
 " OUTPATIENT OCCUPATIONAL THERAPY TREATMENT NOTE  RIGHT LOWER EXTREMITY/  RIGHT LOWER QUADRANT  LYMPHEDEMA  Patient Name: Danielle Aguilar MRN: [id number] DOB:09-04-1986, 39 y.o., female Today's Date: 12/09/2024  END OF SESSION:  OT End of Session - 12/09/24 1112     Visit Number 8    Number of Visits 36    Date for Recertification  01/26/25    OT Start Time 1105    OT Stop Time 1205    OT Time Calculation (min) 60 min    Activity Tolerance Patient tolerated treatment well;No increased pain;Other (comment)   CP with R hemiplegia   Behavior During Therapy WFL for tasks assessed/performed            Past Medical History:  Diagnosis Date   Diabetes mellitus without complication (HCC)    History reviewed. No pertinent surgical history. Patient Active Problem List   Diagnosis Date Noted   Allergic rhinitis 02/23/2022   Cerebral palsy (HCC) 02/23/2022   Chronic kidney disease, stage 1 02/23/2022   Diabetic renal disease (HCC) 02/23/2022   Gastro-esophageal reflux disease without esophagitis 02/23/2022   Hemiplegia of dominant side (HCC) 02/23/2022   Morbid obesity (HCC) 02/23/2022   Pure hypercholesterolemia 02/23/2022   Type 2 diabetes mellitus with diabetic nephropathy (HCC) 02/23/2022   Uncontrolled type 2 diabetes mellitus with hyperglycemia (HCC) 08/14/2018    PCP: Danielle Barrow, MD  REFERRING PROVIDER: same  REFERRING DIAG: I89.0  THERAPY DIAG:  Lymphedema, not elsewhere classified  Rationale for Evaluation and Treatment: Rehabilitation  ONSET DATE: several years ago. No known precipitating event  SUBJECTIVE:                                                                                                                                                                                           SUBJECTIVE STATEMENT: Danielle Aguilar presents to OT for lymphedema care to bilateral legs, with  RLE/RLQ> L. Pt is accompanied by her mother today.  Pt reports LE related leg  pain is 0/10. Pt presents with dry, multilayer compression wraps in place with overall good application technique. Lymphorrhea is resolved for several visits now. Pt states she has managed daily compression wrapping and skin care  well multiple people assisting since last visit before Christmas. Pt has no new complaints.  (10/28/24 Initial OT Eval: Danielle Aguilar is referred to Occupational Therapy by Danielle Skeeter, MD, for evaluation and treatment of BLE lymphedema. Pt. Is accompanied by her mother, Danielle Aguilar. Pt ambulates to clinic with ataxic gait using extra time. She reports the pain, redness, and swelling in her right leg and foot worsened a lot during a  recent first-ever episode of RLE cellulitis,  which required oral antibiotics Keflex and doxycycline. Since the cellulitis ~ 2 months ago Pt applies skin lotion ( brand unknown) 2 x daily, and applies a large gob of Vaseline at night with clean calf-length socks on top. Pt denies lymphorrhea. Pt states she has had leg swelling for a long time. She does not know exactly when it started. )  PERTINENT HISTORY:  Chronic venous stasis dermatitis,  10/03/24 BLE Doppler ruled out DVT Uncontrolled DM Type 2 CP w R hemiplegia Myopia Diabetic renal disease Obesity (BMI 43.1) BLE Lymphedema Recent cellulitis R Leg  PAIN:  Are you having pain? Not rated numerically; heavy, itchy, aching Decreases: unknown Increases: standing, walking, sitting  PRECAUTIONS: Fall and Other: LYMPHEDEMA: skin precautions 2/2 DM  RED FLAGS: None   WEIGHT BEARING RESTRICTIONS: No  FALLS:  Has patient fallen in last 6 months? TBA  LIVING ENVIRONMENT: Lives with: lives alone Lives in: House/apartment Stairs: TBA Has following equipment at home: None  OCCUPATION: Works part time at [employer]- requires standing and walking  EDUCATION: HS  LEISURE: friends and family time  HAND DOMINANCE: left   PRIOR LEVEL OF FUNCTION: extra time for functional ambulation  and transfers, needs AD for self care  PATIENT GOALS: get swelling down and limit recurrence of cellulitis  OBJECTIVE: Moderate, BLE, Stage  II Lymphedema 2/2 suspected CVI and Obesity, R>L  Note: Objective measures were completed at Evaluation unless otherwise noted.  COGNITION:  Overall cognitive status: difficult to assess. Some difficulty with medical hx. Asks excellent questions. Fully participates in eval with appropriate affect. Processing speed slightly delayed.  OBSERVATIONS / OTHER ASSESSMENTS:  POSTURE: R hemiplegia; random, uncontrolled, jerky movements of R upper extremity, head and trunk  LE ROM: N/T due to spasticity.  Limited ability bend to over to reach feet and distal legs with hands   LYMPHEDEMA ASSESSMENTS:   SURGERY TYPE/DATE: N/A  NUMBER OF LYMPH NODES REMOVED: N/A  CHEMOTHERAPY: N/A  RADIATION:N/A  HORMONE TREATMENT: N/A  INFECTIONS: s/p R leg / foot cellulitis . Rx with oral Keflex and doxycycline   BLE COMPARATIVE LIMB VOLUMETRICS INITIAL 11/04/24  LANDMARK RIGHT    R LEG (A-D) 6737.9 ml  R THIGH (E-G) ml  R FULL LIMB (A-G) ml  Limb Volume differential (LVD)  LVD of LEGS measures 18%, L>R. Typically limb volume differential favors the dominant limb by 3-4 %.  Volume change since initial %  Volume change overall V  (Blank rows = not tested)  LANDMARK LEFT  (L functionally dominant 2/2 R hemiplegia)  L LEG (A-D) 5527.9 ml  L THIGH (E-G) ml  L FULL LIMB (A-G) ml  Limb Volume differential (LVD)  %  Volume change since initial %  Volume change overall %  (Blank rows = not tested)    SKIN CONDITION/TISSUE INTEGRITY:  Skin  Description Hyper- Keratosis Peau d d'Orange Shiny Tight Fibrotic/ Indurated Fatty Doughy Spongy/ boggy    Fibrotic skin thickening x  x x       Skin dry Flaky WNL Macerated Crusty Exudate   x x   Dry, cracked and scaly with yellowish discharge crusty plaque   Color Redness Varicosities Blanching Hemosiderin Stain  Mottled   x  x   x   Odor Malodorous Yeast Fungal infection  WNL      x   Temperature Warm Cool wnl   x      Pitting Edema   1+ 2+ 3+ 4+ Non-pitting  x   Girth Symmetrical Asymmetrical                   Distribution    R>L toes to groin on R; toes to popliteal on L    Stemmer Sign Positive Negative   Strong +    Lymphorrhea History Of:  Present Absent   x      Wounds History Of Present Absent Venous Arterial Pressure Sheer   Unknown  x        Signs of Infection Redness Warmth Erythema Acute  Swelling Drainage Borders   x x x Fluctuating x             Sensation Light Touch Deep pressure Hypersensitivity   In tact Impaired In tact Impaired Absent Impaired   Difficult to assess    x     Nails WNL   Fungus nail dystrophy     Severe, discolored    Hair Growth Symmetrical Asymmetrical   Difficult to assess    Skin Creases Base of toes  Ankles   Base of Fingers knees       Abdominal pannus Thigh Lobules  Face/neck   x x  x suspected      GAIT: Distance walked: ataxic gait Assistive device utilized: None Level of assistance: Modified independence Comments: achilles sx as a child  LYMPHEDEMA LIFE IMPACT SCALE (LLIS): TBA initial Rx visit (The extent to which LE-related problems interfered with your life  over the last week)                                                                                                                            TREATMENT this DATE:  Pt and family edu MLD to RLE w simultaneous skin care Compression wrapping as established to R LEG  PATIENT/ FAMILY EDUCATION:  Continued Pt/ CG edu for lymphedema self care home program throughout session. Topics include outcome of comparative limb volumetrics- starting limb volume differentials (LVDs), technology and gradient techniques used for short stretch, multilayer compression wrapping, simple self-MLD, therapeutic lymphatic pumping exercises, skin/nail care, LE  precautions, compression garment recommendations and specifications, wear and care schedule and compression garment donning / doffing w assistive devices. Discussed progress towards all OT goals since commencing CDT. Discussed detrimental impact of obesity on lower and upper extremity lymphedema over time. Reviewed OT goals for lymphedema care with Pt and discussed progress to date.  All questions answered to the Pt's satisfaction. Good return. Person educated: Patient  Education method: Explanation, Demonstration, and Handouts Education comprehension: verbalized understanding, returned demonstration, verbal cues required, and needs further education  LYMPHEDEMA SELF-CARE HOME PROGRAM: BLE lymphatic pumping there ex- 1 set of 10 each element, in order. Hold 5. 2 x daily  Daily, short stretch, thigh or knee length, multilayer compression bandages to one leg at a time during Intensive Phase CDT   During self-management Phase of CDT fit Pt with BLE custom compression garments and HOS devices  bilaterally to control swelling. Custom-made gradient compression garments and HOS devices are medically necessary because they are uniquely sized and shaped to fit the exact dimensions of the affected extremities, and to provide appropriate medical grade, graduated compression essential for optimally managing chronic, progressive lymphedema. Multiple custom compression garments are needed to ensure proper hygiene to limit infection risk. Custom compression garments should be replaced q 3-6 months When worn consistently for optimal lipo-lymphedema self-management over time. HOS devices, medically necessary to limit fibrosis buildup in tissue, should be replaced q 2 years and PRN when worn out.    4. Consider trial of advanced, sequential, pneumatic , compression device, Flexitouch, for optimal LE self management of LE at home over time.  5. Daily simple self MLD used in conjunction with pneumatic device to engage  thoracic duct return and to stimulate the terminus LNs in subclavian region.  6. Daily skin care with low ph lotion matching skin ph to reduce infection risk.  7. Leg elevation when seated    ASSESSMENT:  CLINICAL IMPRESSION: Pt tolerated multilayer compression wraps since last session. She has trained a few family members and friends to assist her with wraps daily. Skin condition continues to improve. Would appears to be healing well and has been without lymphorrhea since commencing regular compression. There is still flaking skin and scab like material that remains in tact. Py has a wound care provider appointment coming up and I encouraged her to keep it to ensure wound is healing properly from the inside out. Provided Pt and family edu today for simple self MLD utilizing functional inguinal LN.  Discussed structure, function and how MLD techniques follow anatomy to reduce limb volume. Pt unable to perform MLD, to either side of body due to R hemiplegia Pt interested in pursuing trial of sequential pneumatic compression device. I believe Pt may be able to get into and out of the basic NIMBL, but doubt seriously she is able to get in and out of the Flexitouch device. T lives alone so a trial is helpful. OT Will request trial of the basic device ASAP and send DME rep Pt's demographics/ . Applied RLE, knee length, gradient compression wraps today as established. Cont as per POC. Pt continues to demonstrate excellent progress towards all OT goals for le management.   (11/04/24 INITIAL OT EVAL: Danielle Aguilar is a 39 yo female presenting with moderate, stage II, BLE/BLQ lymphedema secondary to suspected venous insufficiency, obesity and trauma resulting from cellulitis. Chronic, progressive limb swelling and associated pain is distributed from toes to groin on the R and below the knee on the left. Lymphedema occurs secondary to trauma when the injury damages lymphatic vessels and structures in the area,  leading to a buildup of protein-rich, extra-cellular fluid carrying cellular debris, waste products, bacteria, viruses, and damaged or abnormal cells in the tissues causing swelling, inflammation and pain in the affected body part. Secondary lymphedema arising from significant soft tissue trauma, like lymphedema originating from other issues, is known to delay wound healing and contribute to infection risk, as well as balance problems. Thus, unchecked lymphedema significantly impacts Pt's with comorbid diabetes and it's complications.    Chronic, progressive lymphedema contributes to functional limitans in all occupational domains, including functional ambulation and mobility, basic and instrumental ADLs, productive activities, leisure pursuits, and social participation. Deformity related to chronic swelling and tissue changes also has a negative impact on body image, self esteem and quality of life. BLE lymphedema contributes to elevated infection risk  and increased fall risk.  Ms. Baldonado will benefit from skilled OT Complete Decongestive Therapy (CDT) the gold standard of lymphedema care, typically includes manual lymphatic drainage (MLD), skin care to limit' infection risk and increase skin excursion, lymphatic pumping exercise, and during the Intensive Phase multilayer, gradient compression bandaging to reduce limb volume. The goal of Intensive Phase CDT is to reduce swelling, reduce infection risk, limit progression,  and master  lymphedema self-care. As Pt transitions to the Self Management Phase of CDT Pt will be fit with appropriate compression garments and/ or devices and will complete a trial of the advanced, sequential, pneumatic, Flexitouch compression device for optimal, long term lymphedema management at home. Without skilled OT for lymphedema care, Pt's condition will progress, and further functional decline is expected.  REHAB POTENTIAL: Pt's prognosis for reducing limb volumes and limiting  progression of chronic, progressive lymphedema is poor without daily caregiver assistance with compression during the Intensive Phase of CDT. Lymphedema treatment has a high burden of care. Due to spasticity, limited hip AROM, and impaired hand function Ms. Burkemper requires daily assistance with application of multilayer, short stretch, knee length, compression bandages  to reduce limb volumes.   With daily caregiver assistance during the Intensive Phase of CDT Pt's prognosis for improvement in her condition is good. With modification and ADL training she may be able to don/ doff and gauge alternative , Velcro,  wrap style , compression leggings .    OBJECTIVE IMPAIRMENTS: Abnormal gait, decreased balance, decreased coordination, decreased knowledge of condition, decreased knowledge of use of DME, decreased mobility, difficulty walking, decreased ROM, increased edema, increased fascial restrictions, increased muscle spasms, impaired flexibility, impaired sensation, impaired tone, impaired UE functional use, impaired vision/preception, postural dysfunction, obesity, pain, and chronic BLE swelling with permanent skin changes and related pain.   ACTIVITY LIMITATIONS: Functional ambulation and mobility ( spasticity and limb heaviness 2/2 lymphedema limits ability to walk, perform transfers, climb curbs and stairs, carry, squat) basic and instrumental ADLS ( fit she's and lower body clothing, bathe lower body, stand and walk to perform self care and home management activities , productive activities at work, leisure pursuits and social participation in the community.    PERSONAL FACTORS: Age, Fitness, Past/current experiences, 3+ comorbidities: CP, obesity, diabetes, and limited caregiver support are also affecting patient's functional outcome. Lymphedema has a high burden of self-care.  REHAB POTENTIAL: Pt's prognosis for reducing limb volumes and limiting progression of chronic, progressive lymphedema is poor  without daily caregiver assistance with compression during the Intensive Phase of CDT. Lymphedema treatment has a high burden of care. Due to spasticity, limited hip AROM, and impaired hand function Ms. Spiewak requires daily assistance with application of multilayer, short stretch, knee length, compression bandages  to reduce limb volumes.   With daily caregiver assistance during the Intensive Phase of CDT Pt's prognosis for improvement in her condition is good. With modification and ADL training she may be able to don/ doff and gauge alternative , Velcro,  wrap style , compression leggings .    EVALUATION COMPLEXITY: Moderate   SHORT TERM GOALS: Target date: 4th OT Rx visit   Pt will demonstrate understanding of lymphedema precautions and prevention strategies with moderate assistance using a printed reference to identify at least 5 precautions and discussing how s/he may implement them into daily life to reduce risk of progression with extra time. Baseline: Dependent Goal status: INITIAL  2.  Pt will be able to apply multilayer, thigh length, gradient, compression wraps to  one leg at a time from toes to groin with max caregiver assist to decrease limb volume, to limit infection risk, and to limit lymphedema progression.  Baseline: Dependent Goal status: INITIAL  LONG TERM GOALS: Target date: 01/26/25  1.Given this patient's Intake score of TBA % on the Lymphedema Life Impact Scale (LLIS), patient will experience a reduction of at least 5 points in her perceived level of functional impairment resulting from lymphedema to improve functional performance and quality of life (QOL). Baseline: TBA % Goal status: INITIAL  2.  Pt will achieve at least a 10% volume reduction in BLE to return limbs to typical size and shape, to limit infection risk and LE progression, to decrease pain, to improve function. Baseline: Dependent Goal status: INITIAL  3.  Pt will obtain appropriate compression  garments/devices and achieve modified independence (extra time + assistive devices) with donning/doffing to optimize limb volume reductions and limit LE progression over time. Baseline: Dependent Goal status: INITIAL  4. During Intensive phase CDT, with modified independence, Pt will achieve at least 85% compliance with all adapted lymphedema self-care home program components, including daily skin care, compression wraps and /or garments, simple self MLD and lymphatic pumping therex to habituate LE self care protocol  into ADLs for optimal LE self-management over time. Baseline: Dependent Goal status: INITIAL  Pt will be able to don and doff Flexitouch advanced sequential compression device garments correctly and operate device according to therapist determined program within   2 weeks of receiving device and undergoing manufacturer's training.  Baseline: Dependent Goal status: INITIAL  PLAN:  OT FREQUENCY: 2x/week  OT DURATION: 12-18  weeks due to bilaterality and severity. We treat one limb at a time only to limit fall risk.  PLANNED INTERVENTIONS: Complete Decongestive Therapy (CDT) to one leg a time , to include 97110-Therapeutic exercises, 97530- Therapeutic activity, 97535- Self Care, [id number]- Manual therapy, Patient/Family education, Manual lymph drainage, Compression bandaging and fitting with accessible, comfortable ,effective , day and nighttime compression garments, DME instructions, and skin care to reduce infection risk  PLAN FOR NEXT SESSION:  BLE comparative limb volumetrics Commence teaching caregiver to apply compression wraps Teach patient to direct a caregiver   Zebedee Dec, MS, OTR/L, CLT-LANA 12/09/2024 12:53 PM  "

## 2024-12-10 ENCOUNTER — Ambulatory Visit: Admitting: Occupational Therapy

## 2024-12-11 ENCOUNTER — Encounter: Attending: Internal Medicine | Admitting: Registered"

## 2024-12-11 ENCOUNTER — Encounter: Payer: Self-pay | Admitting: Occupational Therapy

## 2024-12-11 ENCOUNTER — Encounter: Payer: Self-pay | Admitting: Registered"

## 2024-12-11 ENCOUNTER — Ambulatory Visit: Admitting: Occupational Therapy

## 2024-12-11 DIAGNOSIS — I890 Lymphedema, not elsewhere classified: Secondary | ICD-10-CM | POA: Diagnosis not present

## 2024-12-11 DIAGNOSIS — F50819 Binge eating disorder, unspecified: Secondary | ICD-10-CM | POA: Insufficient documentation

## 2024-12-11 DIAGNOSIS — E119 Type 2 diabetes mellitus without complications: Secondary | ICD-10-CM | POA: Insufficient documentation

## 2024-12-11 NOTE — Therapy (Signed)
 " OUTPATIENT OCCUPATIONAL THERAPY TREATMENT NOTE  RIGHT LOWER EXTREMITY/  RIGHT LOWER QUADRANT  LYMPHEDEMA  Patient Name: Danielle Aguilar MRN: [id number] DOB:October 03, 1986, 39 y.o., female Today's Date: 12/11/2024  END OF SESSION:  OT End of Session - 12/11/24 1246     Visit Number 9    Number of Visits 36    Date for Recertification  01/26/25    OT Start Time 0900    OT Stop Time 1019    OT Time Calculation (min) 79 min    Activity Tolerance Patient tolerated treatment well;No increased pain;Other (comment)   CP with R hemiplegia   Behavior During Therapy WFL for tasks assessed/performed            Past Medical History:  Diagnosis Date   Diabetes mellitus without complication (HCC)    History reviewed. No pertinent surgical history. Patient Active Problem List   Diagnosis Date Noted   Allergic rhinitis 02/23/2022   Cerebral palsy (HCC) 02/23/2022   Chronic kidney disease, stage 1 02/23/2022   Diabetic renal disease (HCC) 02/23/2022   Gastro-esophageal reflux disease without esophagitis 02/23/2022   Hemiplegia of dominant side (HCC) 02/23/2022   Morbid obesity (HCC) 02/23/2022   Pure hypercholesterolemia 02/23/2022   Type 2 diabetes mellitus with diabetic nephropathy (HCC) 02/23/2022   Uncontrolled type 2 diabetes mellitus with hyperglycemia (HCC) 08/14/2018    PCP: Danielle Barrow, MD  REFERRING PROVIDER: same  REFERRING DIAG: I89.0  THERAPY DIAG:  Lymphedema, not elsewhere classified  Rationale for Evaluation and Treatment: Rehabilitation  ONSET DATE: several years ago. No known precipitating event  SUBJECTIVE:                                                                                                                                                                                           SUBJECTIVE STATEMENT: Danielle Aguilar presents to OT for lymphedema care to bilateral legs, with  RLE/RLQ> L. Pt is accompanied by her mother today.  Pt reports LE related leg  pain is 0/10. Pt presents with dry, multilayer compression wraps in place with overall good application technique. Lymphorrhea is resolved for several visits now. Pt states she has managed daily compression wrapping and skin care  well multiple people assisting since last visit before Christmas. Pt has no new complaints.  Pt, mother and OT had long discussion during session about need for additional assistance with bandaging, and possibly donning and doffing compression stockings . Pt and mother educated about Medicare rules and restrictions for part A ( home health care) and Part B (outpatient rehab for LE care). Discussed need for daily wrapping during Intensive Phase of CDT  to reduce limb volume before fitting garment, which is intended to help sustain swelling reduction. Discussed Voc Rehab as possible resource for assistance. Encouraged Pt to discuss needs and obstacles with job coach.  (10/28/24 Initial OT Eval: Danielle Aguilar is referred to Occupational Therapy by Danielle Skeeter, MD, for evaluation and treatment of BLE lymphedema. Pt. Is accompanied by her mother, Danielle Aguilar. Pt ambulates to clinic with ataxic gait using extra time. She reports the pain, redness, and swelling in her right leg and foot worsened a lot during a recent first-ever episode of RLE cellulitis,  which required oral antibiotics Keflex and doxycycline. Since the cellulitis ~ 2 months ago Pt applies skin lotion ( brand unknown) 2 x daily, and applies a large gob of Vaseline at night with clean calf-length socks on top. Pt denies lymphorrhea. Pt states she has had leg swelling for a long time. She does not know exactly when it started. )  PERTINENT HISTORY:  Chronic venous stasis dermatitis,  10/03/24 BLE Doppler ruled out DVT Uncontrolled DM Type 2 CP w R hemiplegia Myopia Diabetic renal disease Obesity (BMI 43.1) BLE Lymphedema Recent cellulitis R Leg  PAIN:  Are you having pain? Not rated numerically; heavy, itchy,  aching Decreases: unknown Increases: standing, walking, sitting  PRECAUTIONS: Fall and Other: LYMPHEDEMA: skin precautions 2/2 DM  RED FLAGS: None   WEIGHT BEARING RESTRICTIONS: No  FALLS:  Has patient fallen in last 6 months? TBA  LIVING ENVIRONMENT: Lives with: lives alone Lives in: House/apartment Stairs: TBA Has following equipment at home: None  OCCUPATION: Works part time at [employer]- requires standing and walking  EDUCATION: HS  LEISURE: friends and family time  HAND DOMINANCE: left   PRIOR LEVEL OF FUNCTION: extra time for functional ambulation and transfers, needs AD for self care  PATIENT GOALS: get swelling down and limit recurrence of cellulitis  OBJECTIVE: Moderate, BLE, Stage  II Lymphedema 2/2 suspected CVI and Obesity, R>L  Note: Objective measures were completed at Evaluation unless otherwise noted.  COGNITION:  Overall cognitive status: difficult to assess. Some difficulty with medical hx. Asks excellent questions. Fully participates in eval with appropriate affect. Processing speed slightly delayed.  OBSERVATIONS / OTHER ASSESSMENTS:  POSTURE: R hemiplegia; random, uncontrolled, jerky movements of R upper extremity, head and trunk  LE ROM: N/T due to spasticity.  Limited ability bend to over to reach feet and distal legs with hands   LYMPHEDEMA ASSESSMENTS:   SURGERY TYPE/DATE: N/A  NUMBER OF LYMPH NODES REMOVED: N/A  CHEMOTHERAPY: N/A  RADIATION:N/A  HORMONE TREATMENT: N/A  INFECTIONS: s/p R leg / foot cellulitis . Rx with oral Keflex and doxycycline   BLE COMPARATIVE LIMB VOLUMETRICS INITIAL 11/04/24  LANDMARK RIGHT    R LEG (A-D) 6737.9 ml  R THIGH (E-G) ml  R FULL LIMB (A-G) ml  Limb Volume differential (LVD)  LVD of LEGS measures 18%, L>R. Typically limb volume differential favors the dominant limb by 3-4 %.  Volume change since initial %  Volume change overall V  (Blank rows = not tested)  LANDMARK LEFT  (L functionally  dominant 2/2 R hemiplegia)  L LEG (A-D) 5527.9 ml  L THIGH (E-G) ml  L FULL LIMB (A-G) ml  Limb Volume differential (LVD)  %  Volume change since initial %  Volume change overall %  (Blank rows = not tested)    SKIN CONDITION/TISSUE INTEGRITY:  Skin  Description Hyper- Keratosis Peau d d'Orange Shiny Tight Fibrotic/ Indurated Fatty Doughy Spongy/ boggy  Fibrotic skin thickening x  x x       Skin dry Flaky WNL Macerated Crusty Exudate   x x   Dry, cracked and scaly with yellowish discharge crusty plaque   Color Redness Varicosities Blanching Hemosiderin Stain Mottled   x  x   x   Odor Malodorous Yeast Fungal infection  WNL      x   Temperature Warm Cool wnl   x      Pitting Edema   1+ 2+ 3+ 4+ Non-pitting         x   Girth Symmetrical Asymmetrical                   Distribution    R>L toes to groin on R; toes to popliteal on L    Stemmer Sign Positive Negative   Strong +    Lymphorrhea History Of:  Present Absent   x      Wounds History Of Present Absent Venous Arterial Pressure Sheer   Unknown  x        Signs of Infection Redness Warmth Erythema Acute  Swelling Drainage Borders   x x x Fluctuating x             Sensation Light Touch Deep pressure Hypersensitivity   In tact Impaired In tact Impaired Absent Impaired   Difficult to assess    x     Nails WNL   Fungus nail dystrophy     Severe, discolored    Hair Growth Symmetrical Asymmetrical   Difficult to assess    Skin Creases Base of toes  Ankles   Base of Fingers knees       Abdominal pannus Thigh Lobules  Face/neck   x x  x suspected      GAIT: Distance walked: ataxic gait Assistive device utilized: None Level of assistance: Modified independence Comments: achilles sx as a child  LYMPHEDEMA LIFE IMPACT SCALE (LLIS): TBA initial Rx visit (The extent to which LE-related problems interfered with your life  over the last week)                                                                                                                             TREATMENT this DATE:  Pt and family edu MLD to RLE w simultaneous skin care Compression wrapping as established to R LEG  PATIENT/ FAMILY EDUCATION:  Continued Pt/ CG edu for lymphedema self care home program throughout session. Topics include outcome of comparative limb volumetrics- starting limb volume differentials (LVDs), technology and gradient techniques used for short stretch, multilayer compression wrapping, simple self-MLD, therapeutic lymphatic pumping exercises, skin/nail care, LE precautions, compression garment recommendations and specifications, wear and care schedule and compression garment donning / doffing w assistive devices. Discussed progress towards all OT goals since commencing CDT. Discussed detrimental impact of obesity on lower and upper extremity lymphedema over time. Reviewed OT goals for lymphedema care with Pt and  discussed progress to date.  All questions answered to the Pt's satisfaction. Good return. Person educated: Patient  Education method: Explanation, Demonstration, and Handouts Education comprehension: verbalized understanding, returned demonstration, verbal cues required, and needs further education  LYMPHEDEMA SELF-CARE HOME PROGRAM: BLE lymphatic pumping there ex- 1 set of 10 each element, in order. Hold 5. 2 x daily  Daily, short stretch, thigh or knee length, multilayer compression bandages to one leg at a time during Intensive Phase CDT   During self-management Phase of CDT fit Pt with BLE custom compression garments and HOS devices bilaterally to control swelling. Custom-made gradient compression garments and HOS devices are medically necessary because they are uniquely sized and shaped to fit the exact dimensions of the affected extremities, and to provide appropriate medical grade, graduated compression essential for optimally managing chronic, progressive lymphedema. Multiple custom  compression garments are needed to ensure proper hygiene to limit infection risk. Custom compression garments should be replaced q 3-6 months When worn consistently for optimal lipo-lymphedema self-management over time. HOS devices, medically necessary to limit fibrosis buildup in tissue, should be replaced q 2 years and PRN when worn out.    4. Consider trial of advanced, sequential, pneumatic , compression device, Flexitouch, for optimal LE self management of LE at home over time.  5. Daily simple self MLD used in conjunction with pneumatic device to engage thoracic duct return and to stimulate the terminus LNs in subclavian region.  6. Daily skin care with low ph lotion matching skin ph to reduce infection risk.  7. Leg elevation when seated    ASSESSMENT:  CLINICAL IMPRESSION: Large patch of flaking skin and scab like material over R posterior lateral leg is about to slough off under Telfa non-stick pad. Pt is doing an excellent job with daily skin care to legs bilaterally.  Pt has a wound care provider appointment coming up and I encouraged her to keep it to ensure wound is healing properly from the inside out. Provided Pt and family edu today for simple self MLD utilizing functional inguinal LN.  Pt unable to perform MLD to either side of body and unable to apply gradient compression wraps with necessary compression due to R hemiplegia.  Pt will continue to explore options for assistance, including thru Voc Rehab job coach. Pt interested in pursuing trial of sequential pneumatic compression device. I believe Pt may be able to get into and out of the basic NIMBL, but doubt seriously she is able to get in and out of the Flexitouch device. T lives alone so a trial is helpful. OT Will request trial of the basic device ASAP and send DME rep Pt's demographics/ . Applied RLE, knee length, gradient compression wraps today as established. Ppt agrees with plan to complete anatomical measurements for R leg at  next OT appointment.   Cont as per POC. Pt continues to demonstrate excellent progress towards all OT goals for le management.   (11/04/24 INITIAL OT EVAL: Danielle Aguilar is a 39 yo female presenting with moderate, stage II, BLE/BLQ lymphedema secondary to suspected venous insufficiency, obesity and trauma resulting from cellulitis. Chronic, progressive limb swelling and associated pain is distributed from toes to groin on the R and below the knee on the left. Lymphedema occurs secondary to trauma when the injury damages lymphatic vessels and structures in the area, leading to a buildup of protein-rich, extra-cellular fluid carrying cellular debris, waste products, bacteria, viruses, and damaged or abnormal cells in the tissues causing swelling, inflammation  and pain in the affected body part. Secondary lymphedema arising from significant soft tissue trauma, like lymphedema originating from other issues, is known to delay wound healing and contribute to infection risk, as well as balance problems. Thus, unchecked lymphedema significantly impacts Pt's with comorbid diabetes and it's complications.    Chronic, progressive lymphedema contributes to functional limitans in all occupational domains, including functional ambulation and mobility, basic and instrumental ADLs, productive activities, leisure pursuits, and social participation. Deformity related to chronic swelling and tissue changes also has a negative impact on body image, self esteem and quality of life. BLE lymphedema contributes to elevated infection risk and increased fall risk.  Danielle Aguilar will benefit from skilled OT Complete Decongestive Therapy (CDT) the gold standard of lymphedema care, typically includes manual lymphatic drainage (MLD), skin care to limit' infection risk and increase skin excursion, lymphatic pumping exercise, and during the Intensive Phase multilayer, gradient compression bandaging to reduce limb volume. The goal of Intensive  Phase CDT is to reduce swelling, reduce infection risk, limit progression,  and master  lymphedema self-care. As Pt transitions to the Self Management Phase of CDT Pt will be fit with appropriate compression garments and/ or devices and will complete a trial of the advanced, sequential, pneumatic, Flexitouch compression device for optimal, long term lymphedema management at home. Without skilled OT for lymphedema care, Pt's condition will progress, and further functional decline is expected.  REHAB POTENTIAL: Pt's prognosis for reducing limb volumes and limiting progression of chronic, progressive lymphedema is poor without daily caregiver assistance with compression during the Intensive Phase of CDT. Lymphedema treatment has a high burden of care. Due to spasticity, limited hip AROM, and impaired hand function Danielle Aguilar requires daily assistance with application of multilayer, short stretch, knee length, compression bandages  to reduce limb volumes.   With daily caregiver assistance during the Intensive Phase of CDT Pt's prognosis for improvement in her condition is good. With modification and ADL training she may be able to don/ doff and gauge alternative , Velcro,  wrap style , compression leggings .    OBJECTIVE IMPAIRMENTS: Abnormal gait, decreased balance, decreased coordination, decreased knowledge of condition, decreased knowledge of use of DME, decreased mobility, difficulty walking, decreased ROM, increased edema, increased fascial restrictions, increased muscle spasms, impaired flexibility, impaired sensation, impaired tone, impaired UE functional use, impaired vision/preception, postural dysfunction, obesity, pain, and chronic BLE swelling with permanent skin changes and related pain.   ACTIVITY LIMITATIONS: Functional ambulation and mobility ( spasticity and limb heaviness 2/2 lymphedema limits ability to walk, perform transfers, climb curbs and stairs, carry, squat) basic and instrumental ADLS  ( fit she's and lower body clothing, bathe lower body, stand and walk to perform self care and home management activities , productive activities at work, leisure pursuits and social participation in the community.    PERSONAL FACTORS: Age, Fitness, Past/current experiences, 3+ comorbidities: CP, obesity, diabetes, and limited caregiver support are also affecting patient's functional outcome. Lymphedema has a high burden of self-care.  REHAB POTENTIAL: Pt's prognosis for reducing limb volumes and limiting progression of chronic, progressive lymphedema is poor without daily caregiver assistance with compression during the Intensive Phase of CDT. Lymphedema treatment has a high burden of care. Due to spasticity, limited hip AROM, and impaired hand function Danielle Aguilar requires daily assistance with application of multilayer, short stretch, knee length, compression bandages  to reduce limb volumes.   With daily caregiver assistance during the Intensive Phase of CDT Pt's prognosis for improvement in her  condition is good. With modification and ADL training she may be able to don/ doff and gauge alternative , Velcro,  wrap style , compression leggings .    EVALUATION COMPLEXITY: Moderate   SHORT TERM GOALS: Target date: 4th OT Rx visit   Pt will demonstrate understanding of lymphedema precautions and prevention strategies with moderate assistance using a printed reference to identify at least 5 precautions and discussing how s/he may implement them into daily life to reduce risk of progression with extra time. Baseline: Dependent Goal status: INITIAL  2.  Pt will be able to apply multilayer, thigh length, gradient, compression wraps to one leg at a time from toes to groin with max caregiver assist to decrease limb volume, to limit infection risk, and to limit lymphedema progression.  Baseline: Dependent Goal status: INITIAL  LONG TERM GOALS: Target date: 01/26/25  1.Given this patient's Intake score  of TBA % on the Lymphedema Life Impact Scale (LLIS), patient will experience a reduction of at least 5 points in her perceived level of functional impairment resulting from lymphedema to improve functional performance and quality of life (QOL). Baseline: TBA % Goal status: INITIAL  2.  Pt will achieve at least a 10% volume reduction in BLE to return limbs to typical size and shape, to limit infection risk and LE progression, to decrease pain, to improve function. Baseline: Dependent Goal status: INITIAL  3.  Pt will obtain appropriate compression garments/devices and achieve modified independence (extra time + assistive devices) with donning/doffing to optimize limb volume reductions and limit LE progression over time. Baseline: Dependent Goal status: INITIAL  4. During Intensive phase CDT, with modified independence, Pt will achieve at least 85% compliance with all adapted lymphedema self-care home program components, including daily skin care, compression wraps and /or garments, simple self MLD and lymphatic pumping therex to habituate LE self care protocol  into ADLs for optimal LE self-management over time. Baseline: Dependent Goal status: INITIAL  Pt will be able to don and doff Flexitouch advanced sequential compression device garments correctly and operate device according to therapist determined program within   2 weeks of receiving device and undergoing manufacturer's training.  Baseline: Dependent Goal status: INITIAL  PLAN:  OT FREQUENCY: 2x/week  OT DURATION: 12-18  weeks due to bilaterality and severity. We treat one limb at a time only to limit fall risk.  PLANNED INTERVENTIONS: Complete Decongestive Therapy (CDT) to one leg a time , to include 97110-Therapeutic exercises, 97530- Therapeutic activity, 97535- Self Care, [id number]- Manual therapy, Patient/Family education, Manual lymph drainage, Compression bandaging and fitting with accessible, comfortable ,effective , day and  nighttime compression garments, DME instructions, and skin care to reduce infection risk  PLAN FOR NEXT SESSION:  BLE comparative limb volumetrics Commence teaching caregiver to apply compression wraps Teach patient to direct a caregiver   Zebedee Dec, MS, OTR/L, CLT-LANA 12/11/2024 12:48 PM  "

## 2024-12-11 NOTE — Progress Notes (Signed)
 Medical Nutrition Therapy:   Appt start time: 2:07  Appt end time: 3:12  *Pt states she would like for me to connect with new RD for collaboration of care; signed ROI (01/18/2022).   Assessment: Primary concerns today:   States she has lymphedema in her right leg and has been traveling to [facility] for treatment 2x/week. States this has somewhat impacted her gym routine but making adjustments. States she has been going to gym in the evenings now. Reports she missed previous appts due to being sick and the holidays. States she has been continuing to check FBS daily and record along with food diary.   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   Recent FBS numbers have been 119-363 mg/dL. Per photos of food intake, pt has been adding multiple pastries on some days. Pt also states she has not been drinking as much water as she used to.    Previous visit:  Reports recent A1c was 8.5% (06/2024) a decrease from 9.0% (03/2024). States she is drinking 3*33 oz (99 oz) bottles of water a day. States she is back on Ozempic 1 mg; dosing on Sundays. States her recent A1c decreased from 11.4% to 9.0% (03/2024). Recent FBS (119-272 mg/dL). Pt states she has increased physical activity from 2 days/week due to 3 days/week.  States she doesn't check consistently 2 hours after meals; unaware of how much time passes. Recent labs from 08/2023: Elevated A1c 11.4% Elevated Glu 319 Low Creatinine 0.44  Low Na 132 Low Cl 97 Elevated UMA Elevated MA/CR ratio 61.6 Elevated Fructosamine 362 Lab work reports iron deficiency anemia.  States she has had medication changes; now taking Tresiba and Ozempic. States she follows-up in 2 months, 12/2022. Additional lab results from 02/2022 are: Urine microalbumin 5.75H Urine MA/CR Ratio 134.3H Glu 283H Chol 143 Trig 135 HDLD 34 LDL Chol 84 eGFR 126  Diagnosed with diabetes at least 10 years ago. 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 States she can sense satisfaction when eating.   Pt states she likes butternut squash, cauliflower (faux potatoes), broccoli with cheese or ranch, green beans, peas, beets as vegetable options. Pt loves veggie tots.     Preferred Learning Style:  No preference indicated   Learning Readiness:  Ready Change in progress   MEDICATIONS: See list   DIETARY INTAKE:  Usual eating pattern includes 3 meals and 2 snacks per day.  Everyday foods include yogurt, nuts, berries, veggie tots, fried chicken, green beans, steamed broccoli, snow peas. Avoided foods include some vegetables.    24-hr recall: did  B (8 AM): Glucerna + 1 waffle + sugar free syrup + cottage cheese + 1 TBS peanut butter + fruit + protein shake or cereal + trail mix + almonds + grapes + cookies  Snk (11 AM): cheese and 3 crackers  L (12-1 PM): PB sandwich +fruit + trail mix or salad bowl + fruit + veggie sticks + cookies  Snk (2 PM): D (6-7 PM): salad bowl + greek yogurt mixed with Grain berry cereal + grapes + PB + chex mix Snk (PM):   Beverages: water (99 oz), coffee (20 oz), diet soda (occasionally); 119+ oz  Usual physical activity: seated elliptical (level 5) 60 min, 4x/week at gym  Estimated energy needs: 2000-2200 calories 225-248  g carbohydrates 150-165 g protein 56-61 g fat  Progress Towards Goal(s):  Some progress.   Nutritional Diagnosis:  NB-1.5 Disordered eating pattern As related to balanced meals.  As evidenced by pt reports indulging.    Intervention:  Mainly listened. Discussed food diary in comparison to daily. We discussed the days where FBS was in recommended range of <130 mg/dL and when it was the highest. We discussed how to have sweet items and it not negatively impact FBS. Pt agreed with goals. Goals: - Using model day of eating from Dec. 4 as  guide to maintaining appropriate blood sugar numbers.   Teaching Method Utilized:  Visual Auditory Hands on  Handouts given during visit include: none  Barriers to learning/adherence to lifestyle change: none identified  Demonstrated degree of understanding via:  Teach Back   Monitoring/Evaluation:  Dietary intake, exercise, and body weight in 1 month(s).

## 2024-12-11 NOTE — Patient Instructions (Signed)
-   Using model day of eating from Dec. 4 as guide to maintaining appropriate blood sugar numbers.

## 2024-12-16 ENCOUNTER — Encounter: Payer: Self-pay | Admitting: Occupational Therapy

## 2024-12-16 ENCOUNTER — Encounter (HOSPITAL_BASED_OUTPATIENT_CLINIC_OR_DEPARTMENT_OTHER): Attending: General Surgery | Admitting: General Surgery

## 2024-12-16 ENCOUNTER — Ambulatory Visit: Admitting: Occupational Therapy

## 2024-12-16 DIAGNOSIS — G8190 Hemiplegia, unspecified affecting unspecified side: Secondary | ICD-10-CM | POA: Insufficient documentation

## 2024-12-16 DIAGNOSIS — I872 Venous insufficiency (chronic) (peripheral): Secondary | ICD-10-CM | POA: Insufficient documentation

## 2024-12-16 DIAGNOSIS — E119 Type 2 diabetes mellitus without complications: Secondary | ICD-10-CM | POA: Insufficient documentation

## 2024-12-16 DIAGNOSIS — I890 Lymphedema, not elsewhere classified: Secondary | ICD-10-CM

## 2024-12-16 NOTE — Therapy (Signed)
 " OUTPATIENT OCCUPATIONAL THERAPY TREATMENT NOTE and PROGRESS REPORT  RIGHT LOWER EXTREMITY/  RIGHT LOWER QUADRANT  LYMPHEDEMA  Patient Name: Danielle Aguilar MRN: [id number] DOB:May 20, 1986, 39 y.o., female Today's Date: 12/16/2024  REPORTING PERIOD: 10/28/24 - 12/16/24  END OF SESSION:  OT End of Session - 12/16/24 1022     Visit Number 10    Number of Visits 36    Date for Recertification  01/26/25    OT Start Time 1015    OT Stop Time 1200    OT Time Calculation (min) 105 min    Activity Tolerance Patient tolerated treatment well;No increased pain;Other (comment)   CP with R hemiplegia   Behavior During Therapy WFL for tasks assessed/performed            Past Medical History:  Diagnosis Date   Diabetes mellitus without complication (HCC)    History reviewed. No pertinent surgical history. Patient Active Problem List   Diagnosis Date Noted   Allergic rhinitis 02/23/2022   Cerebral palsy (HCC) 02/23/2022   Chronic kidney disease, stage 1 02/23/2022   Diabetic renal disease (HCC) 02/23/2022   Gastro-esophageal reflux disease without esophagitis 02/23/2022   Hemiplegia of dominant side (HCC) 02/23/2022   Morbid obesity (HCC) 02/23/2022   Pure hypercholesterolemia 02/23/2022   Type 2 diabetes mellitus with diabetic nephropathy (HCC) 02/23/2022   Uncontrolled type 2 diabetes mellitus with hyperglycemia (HCC) 08/14/2018    PCP: Velna Barrow, MD  REFERRING PROVIDER: same  REFERRING DIAG: I89.0  THERAPY DIAG:  Lymphedema, not elsewhere classified  Rationale for Evaluation and Treatment: Rehabilitation  ONSET DATE: several years ago. No known precipitating event  SUBJECTIVE:                                                                                                                                                                                           SUBJECTIVE STATEMENT: Danielle Aguilar presents to OT 1 hour early for lymphedema care to bilateral legs, with   RLE/RLQ> L. Pt is accompanied by her mother today.  Pt reports LE related leg pain is 0/10. Pt had a wound care consult this morning before her OT visit. Pt reports the wound ctr provider confirmed that the wound on her R leg is healed and she does not need wound care at this time. Pt is very pleased with this news. She presents with multilayer wraps in place. Updated Pt on plan for compression device trial on Monday 12/23/24.  (10/28/24 Initial OT Eval: Danielle Aguilar is referred to Occupational Therapy by [name], MD, for evaluation and treatment of BLE lymphedema. Pt. Is accompanied by her mother, Danielle Aguilar.  Pt ambulates to clinic with ataxic gait using extra time. She reports the pain, redness, and swelling in her right leg and foot worsened a lot during a recent first-ever episode of RLE cellulitis,  which required oral antibiotics Keflex and doxycycline. Since the cellulitis ~ 2 months ago Pt applies skin lotion ( brand unknown) 2 x daily, and applies a large gob of Vaseline at night with clean calf-length socks on top. Pt denies lymphorrhea. Pt states she has had leg swelling for a long time. She does not know exactly when it started. )  PERTINENT HISTORY:  Chronic venous stasis dermatitis,  10/03/24 BLE Doppler ruled out DVT Uncontrolled DM Type 2 CP w R hemiplegia Myopia Diabetic renal disease Obesity (BMI 43.1) BLE Lymphedema Recent cellulitis R Leg  PAIN:  Are you having pain? Not rated numerically; heavy, itchy, aching Decreases: unknown Increases: standing, walking, sitting  PRECAUTIONS: Fall and Other: LYMPHEDEMA: skin precautions 2/2 DM  RED FLAGS: None   WEIGHT BEARING RESTRICTIONS: No  FALLS:  Has patient fallen in last 6 months? No  LIVING ENVIRONMENT: Lives with: lives alone Lives in: House/apartment Stairs: none Has following equipment at home: None  OCCUPATION: Works part time at [employer]- requires standing and walking  EDUCATION: HS  LEISURE: friends  and family time  HAND DOMINANCE: left   PRIOR LEVEL OF FUNCTION: extra time for functional ambulation and transfers, needs AD for self care  PATIENT GOALS: get swelling down and limit recurrence of cellulitis  OBJECTIVE: Moderate, BLE, Stage  II Lymphedema 2/2 suspected CVI and Obesity, R>L  Note: Objective measures were completed at Evaluation unless otherwise noted.  COGNITION:  Some difficulty with medical hx. Asks excellent questions. Fully participates in eval with appropriate affect. Processing speed slightly delayed.  OBSERVATIONS / OTHER ASSESSMENTS:  POSTURE: R hemiplegia; spasticity  LE ROM: N/T due to spasticity.  Limited ability bend to over to reach feet and distal legs with hands   LYMPHEDEMA ASSESSMENTS:   SURGERY TYPE/DATE: N/A  NUMBER OF LYMPH NODES REMOVED: N/A  CHEMOTHERAPY: N/A  RADIATION:N/A  HORMONE TREATMENT: N/A  INFECTIONS: s/p R leg / foot cellulitis . Rx with oral Keflex and doxycycline   BLE COMPARATIVE LIMB VOLUMETRICS INITIAL 11/04/24  LANDMARK RIGHT    R LEG (A-D) 6737.9 ml  R THIGH (E-G) ml  R FULL LIMB (A-G) ml  Limb Volume differential (LVD)  LVD of LEGS measures 18%, L>R. Typically limb volume differential favors the dominant limb by 3-4 %.  Volume change since initial %  Volume change overall V  (Blank rows = not tested)  LANDMARK LEFT  (L functionally dominant 2/2 R hemiplegia)  L LEG (A-D) 5527.9 ml  L THIGH (E-G) ml  L FULL LIMB (A-G) ml  Limb Volume differential (LVD)  %  Volume change since initial %  Volume change overall %  (Blank rows = not tested)     RLE COMPARATIVE LIMB VOLUMETRICS 10 th visit PROGRESS REPORT 12/16/24  LANDMARK RIGHT    R LEG (A-D) 4916. 2 ml  R THIGH (E-G) ml  R FULL LIMB (A-G) ml  Limb Volume differential (LVD)    Volume change since initial measurements on 11/04/24 T LEG (AD) volume is reduced by 27.3 % since initially measured on 11/04/24.  Volume change overall V    SKIN  CONDITION/TISSUE INTEGRITY:  Skin  Description Hyper- Keratosis Peau d d'Orange Shiny Tight Fibrotic/ Indurated Fatty Doughy Spongy/ boggy    Fibrotic skin thickening x  x x  Skin dry Flaky WNL Macerated Crusty Exudate   x        Color Redness Varicosities Blanching Hemosiderin Stain Mottled   x  x   x   Odor Malodorous Yeast Fungal infection  WNL      x   Temperature Warm Cool wnl     x    Pitting Edema   1+ 2+ 3+ 4+ Non-pitting         x   Girth Symmetrical Asymmetrical                   Distribution    R>L toes to groin on R; toes to popliteal on L    Stemmer Sign Positive Negative   +    Lymphorrhea History Of:  Present Absent   x      Wounds History Of Present Absent Venous Arterial Pressure Non Pressure Ulcer   x  Resolved R posterior leg wound        Signs of Infection Redness Warmth Erythema Acute  Swelling Drainage Borders   x                 Sensation Light Touch Deep pressure Hypersensitivity   In tact Impaired In tact Impaired Absent Impaired   Difficult to assess    x     Nails WNL   Fungus nail dystrophy     Severe, discolored    Hair Growth Symmetrical Asymmetrical   x    Skin Creases Base of toes  Ankles   Base of Fingers knees       Abdominal pannus Thigh Lobules  Face/neck   x          GAIT: Distance walked: ataxic gait Assistive device utilized: None Level of assistance: Modified independence Comments: achilles sx as a child  LYMPHEDEMA LIFE IMPACT SCALE (LLIS): DEFERRED (The extent to which LE-related problems interfered with your life  over the last week)                                                                                                                            TREATMENT this DATE:  Pt and family edu re LE precautions, garment options , measuring and fitting process, and progress towards goals to date. Comparative limb volumetrics Compression wrapping as established to R LEG  PATIENT/  FAMILY EDUCATION:  Continued Pt/ CG edu for lymphedema self care home program throughout session. Topics include outcome of comparative limb volumetrics- starting limb volume differentials (LVDs), technology and gradient techniques used for short stretch, multilayer compression wrapping, simple self-MLD, therapeutic lymphatic pumping exercises, skin/nail care, LE precautions, compression garment recommendations and specifications, wear and care schedule and compression garment donning / doffing w assistive devices. Discussed progress towards all OT goals since commencing CDT. Discussed detrimental impact of obesity on lower and upper extremity lymphedema over time. Reviewed OT goals for lymphedema care with Pt and discussed progress to date.  All questions answered  to the Pt's satisfaction. Good return. Person educated: Patient  Education method: Explanation, Demonstration, and Handouts Education comprehension: verbalized understanding, returned demonstration, verbal cues required, and needs further education   LYMPHEDEMA SELF-CARE HOME PROGRAM: Fit Pt with appropriate day and night time compression garments and devices that meet severity lymphedema and occupational demands.  Fit Pt with quantity 3 for R LEG and L for L LEG, custom, BLE, knee length, flat knit, ELVAREX SOFT, ccl 2 ( 25-32 mmHg) with oblique top edge, slat open toe, T heel and silicone top band to control swelling, to limit infection risk non-healing wounds, to limit lymphedema progression, and to limit further functional decline.   Fit Pt with bilateral, knee length, custom, ccl 1 ( 18-21 mmHg) Jobst RELAX convoluted HOS devices with ZIPPERS to facilitate optimal lymphatic flow during HOS and to limit fibrosis formation.  Appropriate day and night time compression garments and devices must be matched with severity of lymphedema and must meet occupational demands. Custom-made gradient compression garments and HOS devices are medically  necessary because they are uniquely sized and shaped to fit the exact dimensions of the affected extremities, and to provide appropriate medical grade, graduated compression essential for optimally managing chronic, progressive lymphedema. Multiple custom compression garments are needed to ensure proper hygiene to limit infection risk. Custom compression garments should be replaced q 3-6 months When worn consistently for optimal lymphedema self-management over time. HOS devices, medically necessary to limit fibrosis buildup in tissue, should be replaced q 2 years and PRN when worn out.    BLE lymphatic pumping there ex- 1 set of 10 each element, in order. Hold 5. 2 x daily  Daily, short stretch, thigh or knee length, multilayer compression bandages to one leg at a time during Intensive Phase CDT  4. Pt will benefit from a daily use of an advanced, sequential, pneumatic , compression device for optimal LE self management of LE at home over time. Pt would most benefit from the [employer] Flexitouch advanced device, but she lives alone and is unable to don and doff the garments associated with this device without max assistance. Instead Pt should be fit with the basic [employer] NIMBL device, which is accessible for her.  5. Daily simple self MLD used in conjunction with pneumatic device to engage thoracic duct return and to stimulate the terminus LNs in subclavian region.  6. Daily skin inspection and skin care with low ph lotion matching skin ph to reduce infection risk.  7. Leg elevation when seated    ASSESSMENT:  CLINICAL IMPRESSION: Pt demonstrates excellent progress towards all OT goals for lymphedema care to date. Her progress surpasses this therapist's expectations . She demonstrates diligent compliance and has a very loyal and supportive group of family and friends who continue to assist her with compression wrapping every day. Comparative limb volumetrics of the R treatment LEG  today reveal a 27.3% reduction in limb volume since commencing OT for CDT. The reduction meets and significantly exceeds the 10% limb volume reduction goal. Cellulitis infection and open , weeping wound on the R posterior leg is resolved. Skin is clean, well hydrated and pliable. Hemosiderin stain persists, but erythema has resolved and limb without signs/ symptoms of infection. Pt is habituating daily BLE skin inspection and care without verbal cues. Please review detailed progress in GOALS section below.  We refined and completed specifications and anatomical measurements for custom, R LEG compression garment and HOS device today. Pt and family educated reprocess of working  with DME vendor to access insurance benefits, and educated re fitting and reordering process.    Applied multilayer wraps as established. Once RLE garment fitting is complete commence Intensive Phase CDT to L LEG. [employer] trial with NIMBL device scheduled for Monday, 12/23/24 at 11 AM.Cont as per POC. Pt continues to demonstrate excellent progress towards all OT goals for le management.   (11/04/24 INITIAL OT EVAL: Danielle Aguilar is a 39 yo female presenting with moderate, stage II, BLE/BLQ lymphedema secondary to suspected venous insufficiency, obesity and trauma resulting from cellulitis. Chronic, progressive limb swelling and associated pain is distributed from toes to groin on the R and below the knee on the left. Lymphedema occurs secondary to trauma when the injury damages lymphatic vessels and structures in the area, leading to a buildup of protein-rich, extra-cellular fluid carrying cellular debris, waste products, bacteria, viruses, and damaged or abnormal cells in the tissues causing swelling, inflammation and pain in the affected body part. Secondary lymphedema arising from significant soft tissue trauma, like lymphedema originating from other issues, is known to delay wound healing and contribute to infection risk, as  well as balance problems. Thus, unchecked lymphedema significantly impacts Pt's with comorbid diabetes and it's complications.    Chronic, progressive lymphedema contributes to functional limitans in all occupational domains, including functional ambulation and mobility, basic and instrumental ADLs, productive activities, leisure pursuits, and social participation. Deformity related to chronic swelling and tissue changes also has a negative impact on body image, self esteem and quality of life. BLE lymphedema contributes to elevated infection risk and increased fall risk.  Danielle Aguilar will benefit from skilled OT Complete Decongestive Therapy (CDT) the gold standard of lymphedema care, typically includes manual lymphatic drainage (MLD), skin care to limit' infection risk and increase skin excursion, lymphatic pumping exercise, and during the Intensive Phase multilayer, gradient compression bandaging to reduce limb volume. The goal of Intensive Phase CDT is to reduce swelling, reduce infection risk, limit progression,  and master  lymphedema self-care. As Pt transitions to the Self Management Phase of CDT Pt will be fit with appropriate compression garments and/ or devices and will complete a trial of the advanced, sequential, pneumatic, Flexitouch compression device for optimal, long term lymphedema management at home. Without skilled OT for lymphedema care, Pt's condition will progress, and further functional decline is expected.  REHAB POTENTIAL: Pt's prognosis for reducing limb volumes and limiting progression of chronic, progressive lymphedema is poor without daily caregiver assistance with compression during the Intensive Phase of CDT. Lymphedema treatment has a high burden of care. Due to spasticity, limited hip AROM, and impaired hand function Danielle Aguilar requires daily assistance with application of multilayer, short stretch, knee length, compression bandages  to reduce limb volumes.   With daily  caregiver assistance during the Intensive Phase of CDT Pt's prognosis for improvement in her condition is good. With modification and ADL training she may be able to don/ doff and gauge alternative , Velcro,  wrap style , compression leggings .    OBJECTIVE IMPAIRMENTS: Abnormal gait, decreased balance, decreased coordination, decreased knowledge of condition, decreased knowledge of use of DME, decreased mobility, difficulty walking, decreased ROM, increased edema, increased fascial restrictions, increased muscle spasms, impaired flexibility, impaired sensation, impaired tone, impaired UE functional use, impaired vision/preception, postural dysfunction, obesity, pain, and chronic BLE swelling with permanent skin changes and related pain.   ACTIVITY LIMITATIONS: Functional ambulation and mobility ( spasticity and limb heaviness 2/2 lymphedema limits ability to walk, perform transfers,  climb curbs and stairs, carry, squat) basic and instrumental ADLS ( fit she's and lower body clothing, bathe lower body, stand and walk to perform self care and home management activities , productive activities at work, leisure pursuits and social participation in the community.    PERSONAL FACTORS: Age, Fitness, Past/current experiences, 3+ comorbidities: CP, obesity, diabetes, and limited caregiver support are also affecting patient's functional outcome. Lymphedema has a high burden of self-care.  REHAB POTENTIAL: Pt's prognosis for reducing limb volumes and limiting progression of chronic, progressive lymphedema is poor without daily caregiver assistance with compression during the Intensive Phase of CDT. Lymphedema treatment has a high burden of care. Due to spasticity, limited hip AROM, and impaired hand function Danielle Aguilar requires daily assistance with application of multilayer, short stretch, knee length, compression bandages  to reduce limb volumes.   With daily caregiver assistance during the Intensive Phase of CDT  Pt's prognosis for improvement in her condition is good. With modification and ADL training she may be able to don/ doff and gauge alternative , Velcro,  wrap style , compression leggings .    EVALUATION COMPLEXITY: Moderate  SHORT TERM GOALS: Target date: 4th OT Rx visit   Pt will demonstrate understanding of lymphedema precautions and prevention strategies with moderate assistance using a printed reference to identify at least 5 precautions and discussing how s/he may implement them into daily life to reduce risk of progression with extra time. Baseline: Dependent Goal status: MET  2.  Pt will be able to apply multilayer, thigh length, gradient, compression wraps to one leg at a time from toes to groin with max caregiver assist to decrease limb volume, to limit infection risk, and to limit lymphedema progression.  Baseline: Dependent Goal status: MET  LONG TERM GOALS: Target date: 01/26/25  1.Given this patient's Intake score of TBA % on the Lymphedema Life Impact Scale (LLIS), patient will experience a reduction of at least 5 points in her perceived level of functional impairment resulting from lymphedema to improve functional performance and quality of life (QOL). Baseline: TBA % Goal status: DEFERRED  2.  Pt will achieve at least a 10% volume reduction in BLE to return limbs to typical size and shape, to limit infection risk and LE progression, to decrease pain, to improve function. Baseline: Dependent Goal status: PARTIALLY MET. 12/16/24 achieved dramatic 27.3% volume reduction in R LEG.  3.  Pt will obtain appropriate compression garments/devices and achieve modified independence (extra time + assistive devices) with donning/doffing to optimize limb volume reductions and limit LE progression over time. Baseline: Dependent Goal status: PROGRESSING. Completed R LEG garment measurements today 12/16/24.  4. During Intensive phase CDT, with modified independence, Pt will achieve at least  85% compliance with all adapted lymphedema self-care home program components, including daily skin care, compression wraps and /or garments, simple self MLD and lymphatic pumping therex to habituate LE self care protocol  into ADLs for optimal LE self-management over time. Baseline: Dependent Goal status: MET. Pt has a very supportive family and community of friends who assist her with compression wraps daily and ensure she has transportation to appointments.  5. Pt will be able to don and doff Flexitouch advanced sequential compression device garments correctly and operate device according to therapist determined program within  2 weeks of receiving device and undergoing manufacturer's training.  Baseline: Dependent Goal status: DEFERRED   MODIFIED: 12/16/24: Pt will be able to don and doff [employer] basic NIMBL sequential, vaso-pneumatic compression device garments  and operate device by issue date after extending length of zipper pull and training by rep to ensure access to assistive device for daily, lymphedema self-management at home over time.  PLAN:  OT FREQUENCY: 2x/week  OT DURATION: 12-18  weeks due to bilaterality and severity. We treat one limb at a time only to limit fall risk.  PLANNED INTERVENTIONS: Complete Decongestive Therapy (CDT) to one leg a time , to include 97110-Therapeutic exercises, 97530- Therapeutic activity, 97535- Self Care, [id number]- Manual therapy, Patient/Family education, Manual lymph drainage, Compression bandaging and fitting with accessible, comfortable ,effective , day and nighttime compression garments, DME instructions, and skin care to reduce infection risk  PLAN FOR NEXT SESSION:  MLD Skin care Compression wrapping Pt and family edu   Zebedee Dec, MS, OTR/L, CLT-LANA 12/16/2024 12:08 PM  "

## 2024-12-17 ENCOUNTER — Ambulatory Visit: Admitting: Occupational Therapy

## 2024-12-18 ENCOUNTER — Encounter: Payer: Self-pay | Admitting: Occupational Therapy

## 2024-12-18 ENCOUNTER — Ambulatory Visit: Admitting: Occupational Therapy

## 2024-12-18 DIAGNOSIS — I890 Lymphedema, not elsewhere classified: Secondary | ICD-10-CM | POA: Diagnosis not present

## 2024-12-18 NOTE — Therapy (Signed)
 " OUTPATIENT OCCUPATIONAL THERAPY TREATMENT NOTE and PROGRESS REPORT  RIGHT LOWER EXTREMITY/  RIGHT LOWER QUADRANT  LYMPHEDEMA  Patient Name: Danielle Aguilar MRN: [id number] DOB:[birth year], 39 y.o., female Today's Date: 12/18/2024  REPORTING PERIOD: 10/28/24 - 12/16/24  END OF SESSION:  OT End of Session - 12/18/24 1302     Visit Number 11    Number of Visits 36    Date for Recertification  01/26/25    OT Start Time 0102    Activity Tolerance Patient tolerated treatment well;No increased pain;Other (comment)   CP with R hemiplegia   Behavior During Therapy WFL for tasks assessed/performed            Past Medical History:  Diagnosis Date   Diabetes mellitus without complication (HCC)    History reviewed. No pertinent surgical history. Patient Active Problem List   Diagnosis Date Noted   Allergic rhinitis 02/23/2022   Cerebral palsy (HCC) 02/23/2022   Chronic kidney disease, stage 1 02/23/2022   Diabetic renal disease (HCC) 02/23/2022   Gastro-esophageal reflux disease without esophagitis 02/23/2022   Hemiplegia of dominant side (HCC) 02/23/2022   Morbid obesity (HCC) 02/23/2022   Pure hypercholesterolemia 02/23/2022   Type 2 diabetes mellitus with diabetic nephropathy (HCC) 02/23/2022   Uncontrolled type 2 diabetes mellitus with hyperglycemia (HCC) 08/14/2018    PCP: Velna Barrow, MD  REFERRING PROVIDER: same  REFERRING DIAG: I89.0  THERAPY DIAG:  Lymphedema, not elsewhere classified  Rationale for Evaluation and Treatment: Rehabilitation  ONSET DATE: several years ago. No known precipitating event  SUBJECTIVE:                                                                                                                                                                                           SUBJECTIVE STATEMENT: Danielle Aguilar presents to OT 1 hour early for lymphedema care to bilateral legs, with  RLE/RLQ> L. Pt is accompanied by her mother today.  Pt reports LE  related leg pain is 0/10. Pt had a wound care consult this morning before her OT visit. Pt reports the wound ctr provider confirmed that the wound on her R leg is healed and she does not need wound care at this time. Pt is very pleased with this news. She presents with multilayer wraps in place. Updated Pt on plan for compression device trial on Monday 12/23/24.  (10/28/24 Initial OT Eval: Danielle Aguilar is referred to Occupational Therapy by [name], MD, for evaluation and treatment of BLE lymphedema. Pt. Is accompanied by her mother, [name]. Pt ambulates to clinic with ataxic gait using extra time. She reports the pain, redness, and  swelling in her right leg and foot worsened a lot during a recent first-ever episode of RLE cellulitis,  which required oral antibiotics Keflex and doxycycline. Since the cellulitis ~ 2 months ago Pt applies skin lotion ( brand unknown) 2 x daily, and applies a large gob of Vaseline at night with clean calf-length socks on top. Pt denies lymphorrhea. Pt states she has had leg swelling for a long time. She does not know exactly when it started. )  PERTINENT HISTORY:  Chronic venous stasis dermatitis,  10/03/24 BLE Doppler ruled out DVT Uncontrolled DM Type 2 CP w R hemiplegia Myopia Diabetic renal disease Obesity (BMI 43.1) BLE Lymphedema Recent cellulitis R Leg  PAIN:  Are you having pain? Not rated numerically; heavy, itchy, aching Decreases: unknown Increases: standing, walking, sitting  PRECAUTIONS: Fall and Other: LYMPHEDEMA: skin precautions 2/2 DM  RED FLAGS: None   WEIGHT BEARING RESTRICTIONS: No  FALLS:  Has patient fallen in last 6 months? No  LIVING ENVIRONMENT: Lives with: lives alone Lives in: House/apartment Stairs: none Has following equipment at home: None  OCCUPATION: Works part time at [employer]- requires standing and walking  EDUCATION: HS  LEISURE: friends and family time  HAND DOMINANCE: left   PRIOR LEVEL OF  FUNCTION: extra time for functional ambulation and transfers, needs AD for self care  PATIENT GOALS: get swelling down and limit recurrence of cellulitis  OBJECTIVE: Moderate, BLE, Stage  II Lymphedema 2/2 suspected CVI and Obesity, R>L  Note: Objective measures were completed at Evaluation unless otherwise noted.  COGNITION:  Some difficulty with medical hx. Asks excellent questions. Fully participates in eval with appropriate affect. Processing speed slightly delayed.  OBSERVATIONS / OTHER ASSESSMENTS:  POSTURE: R hemiplegia; spasticity  LE ROM: N/T due to spasticity.  Limited ability bend to over to reach feet and distal legs with hands   LYMPHEDEMA ASSESSMENTS:   SURGERY TYPE/DATE: N/A  NUMBER OF LYMPH NODES REMOVED: N/A  CHEMOTHERAPY: N/A  RADIATION:N/A  HORMONE TREATMENT: N/A  INFECTIONS: s/p R leg / foot cellulitis . Rx with oral Keflex and doxycycline   BLE COMPARATIVE LIMB VOLUMETRICS INITIAL 11/04/24  LANDMARK RIGHT    R LEG (A-D) 6737.9 ml  R THIGH (E-G) ml  R FULL LIMB (A-G) ml  Limb Volume differential (LVD)  LVD of LEGS measures 18%, L>R. Typically limb volume differential favors the dominant limb by 3-4 %.  Volume change since initial %  Volume change overall V  (Blank rows = not tested)  LANDMARK LEFT  (L functionally dominant 2/2 R hemiplegia)  L LEG (A-D) 5527.9 ml  L THIGH (E-G) ml  L FULL LIMB (A-G) ml  Limb Volume differential (LVD)  %  Volume change since initial %  Volume change overall %  (Blank rows = not tested)     RLE COMPARATIVE LIMB VOLUMETRICS 10 th visit PROGRESS REPORT 12/16/24  LANDMARK RIGHT    R LEG (A-D) 4916. 2 ml  R THIGH (E-G) ml  R FULL LIMB (A-G) ml  Limb Volume differential (LVD)    Volume change since initial measurements on 11/04/24 T LEG (AD) volume is reduced by 27.3 % since initially measured on 11/04/24.  Volume change overall V    SKIN CONDITION/TISSUE INTEGRITY:  Skin  Description Hyper- Keratosis Peau d  d'Orange Shiny Tight Fibrotic/ Indurated Fatty Doughy Spongy/ boggy    Fibrotic skin thickening x  x x       Skin dry Flaky WNL Macerated Crusty Exudate   x  Color Redness Varicosities Blanching Hemosiderin Stain Mottled   x  x   x   Odor Malodorous Yeast Fungal infection  WNL      x   Temperature Warm Cool wnl     x    Pitting Edema   1+ 2+ 3+ 4+ Non-pitting         x   Girth Symmetrical Asymmetrical                   Distribution    R>L toes to groin on R; toes to popliteal on L    Stemmer Sign Positive Negative   +    Lymphorrhea History Of:  Present Absent   x      Wounds History Of Present Absent Venous Arterial Pressure Non Pressure Ulcer   x  Resolved R posterior leg wound        Signs of Infection Redness Warmth Erythema Acute  Swelling Drainage Borders   x                 Sensation Light Touch Deep pressure Hypersensitivity   In tact Impaired In tact Impaired Absent Impaired   Difficult to assess    x     Nails WNL   Fungus nail dystrophy     Severe, discolored    Hair Growth Symmetrical Asymmetrical   x    Skin Creases Base of toes  Ankles   Base of Fingers knees       Abdominal pannus Thigh Lobules  Face/neck   x          GAIT: Distance walked: ataxic gait Assistive device utilized: None Level of assistance: Modified independence Comments: achilles sx as a child  LYMPHEDEMA LIFE IMPACT SCALE (LLIS): DEFERRED (The extent to which LE-related problems interfered with your life  over the last week)                                                                                                                            TREATMENT this DATE:  Pt and family edu re LE precautions, garment options , measuring and fitting process, and progress towards goals to date. Comparative limb volumetrics Compression wrapping as established to R LEG  PATIENT/ FAMILY EDUCATION:  Continued Pt/ CG edu for lymphedema self care home program  throughout session. Topics include outcome of comparative limb volumetrics- starting limb volume differentials (LVDs), technology and gradient techniques used for short stretch, multilayer compression wrapping, simple self-MLD, therapeutic lymphatic pumping exercises, skin/nail care, LE precautions, compression garment recommendations and specifications, wear and care schedule and compression garment donning / doffing w assistive devices. Discussed progress towards all OT goals since commencing CDT. Discussed detrimental impact of obesity on lower and upper extremity lymphedema over time. Reviewed OT goals for lymphedema care with Pt and discussed progress to date.  All questions answered to the Pt's satisfaction. Good return. Person educated: Patient  Education method: Explanation, Demonstration, and Handouts Education  comprehension: verbalized understanding, returned demonstration, verbal cues required, and needs further education   LYMPHEDEMA SELF-CARE HOME PROGRAM: Fit Pt with appropriate day and night time compression garments and devices that meet severity lymphedema and occupational demands.  Fit Pt with quantity 3 for R LEG and L for L LEG, custom, BLE, knee length, flat knit, ELVAREX SOFT, ccl 2 ( 25-32 mmHg) with oblique top edge, slat open toe, T heel and silicone top band to control swelling, to limit infection risk non-healing wounds, to limit lymphedema progression, and to limit further functional decline.   Fit Pt with bilateral, knee length, custom, ccl 1 ( 18-21 mmHg) Jobst RELAX convoluted HOS devices with ZIPPERS to facilitate optimal lymphatic flow during HOS and to limit fibrosis formation.  Donning and doffing aides are medically necessary to assist Pt with hemiplegia and limited R arm and hand function to access compression garments and devices as independently as possible. Pt lives alone and does not have reafdily available assistance on a daily basis. Medically necessary  assistive devices include  1 Mediven stocking BUTLER OFF,  1 Mediven stocking BUTLER ON Frame, quantity 1 Arion Easy Slide donning aide for open toe, and quantity 1 pairJobst friction Donning Gloves sz small.  Appropriate day and night time compression garments and devices must be matched with severity of lymphedema and must meet occupational demands. Custom-made gradient compression garments and HOS devices are medically necessary because they are uniquely sized and shaped to fit the exact dimensions of the affected extremities, and to provide appropriate medical grade, graduated compression essential for optimally managing chronic, progressive lymphedema. Multiple custom compression garments are needed to ensure proper hygiene to limit infection risk. Custom compression garments should be replaced q 3-6 months When worn consistently for optimal lymphedema self-management over time. HOS devices, medically necessary to limit fibrosis buildup in tissue, should be replaced q 2 years and PRN when worn out.    BLE lymphatic pumping there ex- 1 set of 10 each element, in order. Hold 5. 2 x daily  Daily, short stretch, thigh or knee length, multilayer compression bandages to one leg at a time during Intensive Phase CDT  4. Pt will benefit from a daily use of an advanced, sequential, pneumatic , compression device for optimal LE self management of LE at home over time. Pt would most benefit from the Tactile Medical Flexitouch advanced device, but she lives alone and is unable to don and doff the garments associated with this device without max assistance. Instead Pt should be fit with the basic Tactile [employer] NIMBL device, which is accessible for her.  5. Daily simple self MLD used in conjunction with pneumatic device to engage thoracic duct return and to stimulate the terminus LNs in subclavian region.  6. Daily skin inspection and skin care with low ph lotion matching skin ph to reduce infection risk.  7.  Leg elevation when seated    ASSESSMENT:  CLINICAL IMPRESSION: Pt demonstrates excellent progress towards all OT goals for lymphedema care to date. Her progress surpasses this therapist's expectations . She demonstrates diligent compliance and has a very loyal and supportive group of family and friends who continue to assist her with compression wrapping every day. Comparative limb volumetrics of the R treatment LEG today reveal a 27.3% reduction in limb volume since commencing OT for CDT. The reduction meets and significantly exceeds the 10% limb volume reduction goal. Cellulitis infection and open , weeping wound on the R posterior leg is resolved. Skin is clean,  well hydrated and pliable. Hemosiderin stain persists, but erythema has resolved and limb without signs/ symptoms of infection. Pt is habituating daily BLE skin inspection and care without verbal cues. Please review detailed progress in GOALS section below.  We refined and completed specifications and anatomical measurements for custom, R LEG compression garment and HOS device today. Pt and family educated reprocess of working with DME vendor to [employer] benefits, and educated re fitting and reordering process.    Applied multilayer wraps as established. Once RLE garment fitting is complete commence Intensive Phase CDT to L LEG. Tactile Medical trial with NIMBL device scheduled for Monday, 12/23/24 at 11 AM.Cont as per POC. Pt continues to demonstrate excellent progress towards all OT goals for le management.   (11/04/24 INITIAL OT EVAL: MIKAELLA ESCALONA is a 39 yo female presenting with moderate, stage II, BLE/BLQ lymphedema secondary to suspected venous insufficiency, obesity and trauma resulting from cellulitis. Chronic, progressive limb swelling and associated pain is distributed from toes to groin on the R and below the knee on the left. Lymphedema occurs secondary to trauma when the injury damages lymphatic vessels and structures in  the area, leading to a buildup of protein-rich, extra-cellular fluid carrying cellular debris, waste products, bacteria, viruses, and damaged or abnormal cells in the tissues causing swelling, inflammation and pain in the affected body part. Secondary lymphedema arising from significant soft tissue trauma, like lymphedema originating from other issues, is known to delay wound healing and contribute to infection risk, as well as balance problems. Thus, unchecked lymphedema significantly impacts Pt's with comorbid diabetes and it's complications.    Chronic, progressive lymphedema contributes to functional limitans in all occupational domains, including functional ambulation and mobility, basic and instrumental ADLs, productive activities, leisure pursuits, and social participation. Deformity related to chronic swelling and tissue changes also has a negative impact on body image, self esteem and quality of life. BLE lymphedema contributes to elevated infection risk and increased fall risk.  Ms. Mcquade will benefit from skilled OT Complete Decongestive Therapy (CDT) the gold standard of lymphedema care, typically includes manual lymphatic drainage (MLD), skin care to limit' infection risk and increase skin excursion, lymphatic pumping exercise, and during the Intensive Phase multilayer, gradient compression bandaging to reduce limb volume. The goal of Intensive Phase CDT is to reduce swelling, reduce infection risk, limit progression,  and master  lymphedema self-care. As Pt transitions to the Self Management Phase of CDT Pt will be fit with appropriate compression garments and/ or devices and will complete a trial of the advanced, sequential, pneumatic, Flexitouch compression device for optimal, long term lymphedema management at home. Without skilled OT for lymphedema care, Pt's condition will progress, and further functional decline is expected.  REHAB POTENTIAL: Pt's prognosis for reducing limb volumes and  limiting progression of chronic, progressive lymphedema is poor without daily caregiver assistance with compression during the Intensive Phase of CDT. Lymphedema treatment has a high burden of care. Due to spasticity, limited hip AROM, and impaired hand function Ms. Witter requires daily assistance with application of multilayer, short stretch, knee length, compression bandages  to reduce limb volumes.   With daily caregiver assistance during the Intensive Phase of CDT Pt's prognosis for improvement in her condition is good. With modification and ADL training she may be able to don/ doff and gauge alternative , Velcro,  wrap style , compression leggings .    OBJECTIVE IMPAIRMENTS: Abnormal gait, decreased balance, decreased coordination, decreased knowledge of condition, decreased knowledge of use  of DME, decreased mobility, difficulty walking, decreased ROM, increased edema, increased fascial restrictions, increased muscle spasms, impaired flexibility, impaired sensation, impaired tone, impaired UE functional use, impaired vision/preception, postural dysfunction, obesity, pain, and chronic BLE swelling with permanent skin changes and related pain.   ACTIVITY LIMITATIONS: Functional ambulation and mobility ( spasticity and limb heaviness 2/2 lymphedema limits ability to walk, perform transfers, climb curbs and stairs, carry, squat) basic and instrumental ADLS ( fit she's and lower body clothing, bathe lower body, stand and walk to perform self care and home management activities , productive activities at work, leisure pursuits and social participation in the community.    PERSONAL FACTORS: Age, Fitness, Past/current experiences, 3+ comorbidities: CP, obesity, diabetes, and limited caregiver support are also affecting patient's functional outcome. Lymphedema has a high burden of self-care.  REHAB POTENTIAL: Pt's prognosis for reducing limb volumes and limiting progression of chronic, progressive  lymphedema is poor without daily caregiver assistance with compression during the Intensive Phase of CDT. Lymphedema treatment has a high burden of care. Due to spasticity, limited hip AROM, and impaired hand function Ms. Chimenti requires daily assistance with application of multilayer, short stretch, knee length, compression bandages  to reduce limb volumes.   With daily caregiver assistance during the Intensive Phase of CDT Pt's prognosis for improvement in her condition is good. With modification and ADL training she may be able to don/ doff and gauge alternative , Velcro,  wrap style , compression leggings .    EVALUATION COMPLEXITY: Moderate  SHORT TERM GOALS: Target date: 4th OT Rx visit   Pt will demonstrate understanding of lymphedema precautions and prevention strategies with moderate assistance using a printed reference to identify at least 5 precautions and discussing how s/he may implement them into daily life to reduce risk of progression with extra time. Baseline: Dependent Goal status: MET  2.  Pt will be able to apply multilayer, thigh length, gradient, compression wraps to one leg at a time from toes to groin with max caregiver assist to decrease limb volume, to limit infection risk, and to limit lymphedema progression.  Baseline: Dependent Goal status: MET  LONG TERM GOALS: Target date: 01/26/25  1.Given this patient's Intake score of TBA % on the Lymphedema Life Impact Scale (LLIS), patient will experience a reduction of at least 5 points in her perceived level of functional impairment resulting from lymphedema to improve functional performance and quality of life (QOL). Baseline: TBA % Goal status: DEFERRED  2.  Pt will achieve at least a 10% volume reduction in BLE to return limbs to typical size and shape, to limit infection risk and LE progression, to decrease pain, to improve function. Baseline: Dependent Goal status: PARTIALLY MET. 12/16/24 achieved dramatic 27.3% volume  reduction in R LEG.  3.  Pt will obtain appropriate compression garments/devices and achieve modified independence (extra time + assistive devices) with donning/doffing to optimize limb volume reductions and limit LE progression over time. Baseline: Dependent Goal status: PROGRESSING. Completed R LEG garment measurements today 12/16/24.  4. During Intensive phase CDT, with modified independence, Pt will achieve at least 85% compliance with all adapted lymphedema self-care home program components, including daily skin care, compression wraps and /or garments, simple self MLD and lymphatic pumping therex to habituate LE self care protocol  into ADLs for optimal LE self-management over time. Baseline: Dependent Goal status: MET. Pt has a very supportive family and community of friends who assist her with compression wraps daily and ensure she has transportation to  appointments.  5. Pt will be able to don and doff Flexitouch advanced sequential compression device garments correctly and operate device according to therapist determined program within  2 weeks of receiving device and undergoing manufacturer's training.  Baseline: Dependent Goal status: DEFERRED   MODIFIED: 12/16/24: Pt will be able to don and doff Tactile Medical basic NIMBL sequential, vaso-pneumatic compression device garments and operate device by issue date after extending length of zipper pull and training by rep to ensure access to assistive device for daily, lymphedema self-management at home over time.  PLAN:  OT FREQUENCY: 2x/week  OT DURATION: 12-18  weeks due to bilaterality and severity. We treat one limb at a time only to limit fall risk.  PLANNED INTERVENTIONS: Complete Decongestive Therapy (CDT) to one leg a time , to include 97110-Therapeutic exercises, 97530- Therapeutic activity, 97535- Self Care, [id number]- Manual therapy, Patient/Family education, Manual lymph drainage, Compression bandaging and fitting with accessible,  comfortable ,effective , day and nighttime compression garments, DME instructions, and skin care to reduce infection risk  PLAN FOR NEXT SESSION:  MLD Skin care Compression wrapping Pt and family edu   Zebedee Dec, MS, OTR/L, CLT-LANA 12/18/2024 1:03 PM  "

## 2024-12-23 ENCOUNTER — Ambulatory Visit: Admitting: Occupational Therapy

## 2024-12-23 DIAGNOSIS — I890 Lymphedema, not elsewhere classified: Secondary | ICD-10-CM | POA: Diagnosis not present

## 2024-12-23 NOTE — Therapy (Signed)
 " OUTPATIENT OCCUPATIONAL THERAPY TREATMENT NOTE   RIGHT LOWER EXTREMITY/  RIGHT LOWER QUADRANT  LYMPHEDEMA  Patient Name: Danielle Aguilar MRN: [id number] DOB:09/15/1986, 39 y.o., female Today's Date: 12/23/2024  REPORTING PERIOD: 10/28/24 - 12/16/24  END OF SESSION:  OT End of Session - 12/23/24 1151     Visit Number 12    Number of Visits 36    Date for Recertification  01/26/25    OT Start Time 1100    Activity Tolerance Patient tolerated treatment well;No increased pain;Other (comment)   CP with R hemiplegia   Behavior During Therapy WFL for tasks assessed/performed            Past Medical History:  Diagnosis Date   Diabetes mellitus without complication (HCC)    No past surgical history on file. Patient Active Problem List   Diagnosis Date Noted   Allergic rhinitis 02/23/2022   Cerebral palsy (HCC) 02/23/2022   Chronic kidney disease, stage 1 02/23/2022   Diabetic renal disease (HCC) 02/23/2022   Gastro-esophageal reflux disease without esophagitis 02/23/2022   Hemiplegia of dominant side (HCC) 02/23/2022   Morbid obesity (HCC) 02/23/2022   Pure hypercholesterolemia 02/23/2022   Type 2 diabetes mellitus with diabetic nephropathy (HCC) 02/23/2022   Uncontrolled type 2 diabetes mellitus with hyperglycemia (HCC) 08/14/2018    PCP: Velna Barrow, MD  REFERRING PROVIDER: same  REFERRING DIAG: I89.0  THERAPY DIAG:  Lymphedema, not elsewhere classified  Rationale for Evaluation and Treatment: Rehabilitation  ONSET DATE: several years ago. No known precipitating event  SUBJECTIVE:                                                                                                                                                                                           SUBJECTIVE STATEMENT: Danielle Aguilar presents to OT for lymphedema care to bilateral legs, with  RLE/RLQ> L. Pt is accompanied by her mother today.  Pt reports LE related leg pain is 0/10. [employer] rep  is here today to assist Pt with trial of the basic, NIMBL, sequential, vaso pneumatic device on the RLE.   (10/28/24 Initial OT Eval: Danielle Aguilar is referred to Occupational Therapy by [name], MD, for evaluation and treatment of BLE lymphedema. Pt. Is accompanied by her mother, [name]. Pt ambulates to clinic with ataxic gait using extra time. She reports the pain, redness, and swelling in her right leg and foot worsened a lot during a recent first-ever episode of RLE cellulitis,  which required oral antibiotics Keflex and doxycycline. Since the cellulitis ~ 2 months ago Pt applies skin lotion ( brand unknown) 2 x daily, and applies  a large gob of Vaseline at night with clean calf-length socks on top. Pt denies lymphorrhea. Pt states she has had leg swelling for a long time. She does not know exactly when it started. )  PERTINENT HISTORY:  Chronic venous stasis dermatitis,  10/03/24 BLE Doppler ruled out DVT Uncontrolled DM Type 2 CP w R hemiplegia Myopia Diabetic renal disease Obesity (BMI 43.1) BLE Lymphedema Recent cellulitis R Leg  PAIN:  Are you having pain? Not rated numerically; heavy, itchy, aching Decreases: unknown Increases: standing, walking, sitting  PRECAUTIONS: Fall and Other: LYMPHEDEMA: skin precautions 2/2 DM  RED FLAGS: None   WEIGHT BEARING RESTRICTIONS: No  FALLS:  Has patient fallen in last 6 months? No  LIVING ENVIRONMENT: Lives with: lives alone Lives in: House/apartment Stairs: none Has following equipment at home: None  OCCUPATION: Works part time at [employer]- requires standing and walking  EDUCATION: HS  LEISURE: friends and family time  HAND DOMINANCE: left   PRIOR LEVEL OF FUNCTION: extra time for functional ambulation and transfers, needs AD for self care  PATIENT GOALS: get swelling down and limit recurrence of cellulitis  OBJECTIVE: Moderate, BLE, Stage  II Lymphedema 2/2 suspected CVI and Obesity, R>L  Note: Objective  measures were completed at Evaluation unless otherwise noted.  COGNITION:  Some difficulty with medical hx. Asks excellent questions. Fully participates in eval with appropriate affect. Processing speed slightly delayed.  OBSERVATIONS / OTHER ASSESSMENTS:  POSTURE: R hemiplegia; spasticity  LE ROM: N/T due to spasticity.  Limited ability bend to over to reach feet and distal legs with hands   LYMPHEDEMA ASSESSMENTS:   SURGERY TYPE/DATE: N/A  NUMBER OF LYMPH NODES REMOVED: N/A  CHEMOTHERAPY: N/A  RADIATION:N/A  HORMONE TREATMENT: N/A  INFECTIONS: s/p R leg / foot cellulitis . Rx with oral Keflex and doxycycline   BLE COMPARATIVE LIMB VOLUMETRICS INITIAL 11/04/24  LANDMARK RIGHT    R LEG (A-D) 6737.9 ml  R THIGH (E-G) ml  R FULL LIMB (A-G) ml  Limb Volume differential (LVD)  LVD of LEGS measures 18%, L>R. Typically limb volume differential favors the dominant limb by 3-4 %.  Volume change since initial %  Volume change overall V  (Blank rows = not tested)  LANDMARK LEFT  (L functionally dominant 2/2 R hemiplegia)  L LEG (A-D) 5527.9 ml  L THIGH (E-G) ml  L FULL LIMB (A-G) ml  Limb Volume differential (LVD)  %  Volume change since initial %  Volume change overall %  (Blank rows = not tested)     RLE COMPARATIVE LIMB VOLUMETRICS 10 th visit PROGRESS REPORT 12/16/24  LANDMARK RIGHT    R LEG (A-D) 4916. 2 ml  R THIGH (E-G) ml  R FULL LIMB (A-G) ml  Limb Volume differential (LVD)    Volume change since initial measurements on 11/04/24 T LEG (AD) volume is reduced by 27.3 % since initially measured on 11/04/24.  Volume change overall V    SKIN CONDITION/TISSUE INTEGRITY:  Skin  Description Hyper- Keratosis Peau d d'Orange Shiny Tight Fibrotic/ Indurated Fatty Doughy Spongy/ boggy    Fibrotic skin thickening x  x x       Skin dry Flaky WNL Macerated Crusty Exudate   x        Color Redness Varicosities Blanching Hemosiderin Stain Mottled   x  x   x   Odor  Malodorous Yeast Fungal infection  WNL      x   Temperature Warm Cool wnl  x    Pitting Edema   1+ 2+ 3+ 4+ Non-pitting         x   Girth Symmetrical Asymmetrical                   Distribution    R>L toes to groin on R; toes to popliteal on L    Stemmer Sign Positive Negative   +    Lymphorrhea History Of:  Present Absent   x      Wounds History Of Present Absent Venous Arterial Pressure Non Pressure Ulcer   x  Resolved R posterior leg wound        Signs of Infection Redness Warmth Erythema Acute  Swelling Drainage Borders   x                 Sensation Light Touch Deep pressure Hypersensitivity   In tact Impaired In tact Impaired Absent Impaired   Difficult to assess    x     Nails WNL   Fungus nail dystrophy     Severe, discolored    Hair Growth Symmetrical Asymmetrical   x    Skin Creases Base of toes  Ankles   Base of Fingers knees       Abdominal pannus Thigh Lobules  Face/neck   x          GAIT: Distance walked: ataxic gait Assistive device utilized: None Level of assistance: Modified independence Comments: achilles sx as a child  LYMPHEDEMA LIFE IMPACT SCALE (LLIS): DEFERRED (The extent to which LE-related problems interfered with your life  over the last week)                                                                                                                            TREATMENT this DATE:  Pt and family edu re basic and advanced compression device Comparative limb circumferences Compression wrapping as established to R LEG  PATIENT/ FAMILY EDUCATION:  Pt/ family education for basic and advanced sequential pneumatic compression devices, or pump, including clinical indications, how they work, how the basic device differs from the advanced, clinical indications, contraindications and precautions and care and use routines for pneumatic compression devices. Patient/ caregiver's questions were answered throughout trial and  handouts and Internet resources given for reference.  PRECAUTIONS: Pt education re precautions related to use of advanced sequential pneumatic compression device. Pt verbalized understanding that she should never use device on 2 arms/legs simultaneously and should not use device 2 x on same day to avoid overloading her heart with fluid volume return both at present, and in years to come should her medical condition change. Pt instructed to remove device immediately should she experience atypical SOP, light headedness, or acute pain. Pt instructed to discontinue pump if she suspects, or has any infection, blood clot, cellulitis, the flu, corona virus, etc.  LYMPHEDEMA SELF-CARE HOME PROGRAM: Fit Pt with appropriate day and night time compression garments and devices  that meet severity lymphedema and occupational demands.  Fit Pt with quantity 3 for R LEG and L for L LEG, custom, BLE, knee length, flat knit, ELVAREX SOFT, ccl 2 ( 25-32 mmHg) with oblique top edge, slat open toe, T heel and silicone top band to control swelling, to limit infection risk non-healing wounds, to limit lymphedema progression, and to limit further functional decline.   Fit Pt with bilateral, knee length, custom, ccl 1 ( 18-21 mmHg) Jobst RELAX convoluted HOS devices with ZIPPERS to facilitate optimal lymphatic flow during HOS and to limit fibrosis formation.  Donning and doffing aides are medically necessary to assist Pt with hemiplegia and limited R arm and hand function to access compression garments and devices as independently as possible. Pt lives alone and does not have reafdily available assistance on a daily basis. Medically necessary assistive devices include  1 Mediven stocking BUTLER OFF,  1 [employer]iven stocking BUTLER ON Frame, quantity 1 Arion Easy Slide donning aide for open toe, and quantity 1 pairJobst friction Donning Gloves sz small.  Appropriate day and night time compression garments and devices must be matched  with severity of lymphedema and must meet occupational demands. Custom-made gradient compression garments and HOS devices are medically necessary because they are uniquely sized and shaped to fit the exact dimensions of the affected extremities, and to provide appropriate medical grade, graduated compression essential for optimally managing chronic, progressive lymphedema. Multiple custom compression garments are needed to ensure proper hygiene to limit infection risk. Custom compression garments should be replaced q 3-6 months When worn consistently for optimal lymphedema self-management over time. HOS devices, medically necessary to limit fibrosis buildup in tissue, should be replaced q 2 years and PRN when worn out.    BLE lymphatic pumping there ex- 1 set of 10 each element, in order. Hold 5. 2 x daily  Daily, short stretch, thigh or knee length, multilayer compression bandages to one leg at a time during Intensive Phase CDT  4. Pt will benefit from a daily use of a basic, or an advanced, sequential, pneumatic , compression device for optimal LE self management of LE at home over time. Pt would most benefit from the [employer] Flexitouch advanced device, but she lives alone and may have difficulty don and doff the garments associated with this device without max assistance. Instead Pt should be fit with the basic [employer] NIMBL device, which is accessible for her. She'll trial the Flexitouch 4 weeks later for comparison.  5. Daily simple self MLD used in conjunction with pneumatic device to engage thoracic duct return and to stimulate the terminus LNs in subclavian region.  6. Daily skin inspection and skin care with low ph lotion matching skin ph to reduce infection risk.  7. Leg elevation when seated    ASSESSMENT:  CLINICAL IMPRESSION:   Danielle Aguilar presents with moderate, stage II, BLE/BLQ lymphedema 2/2 CVI and exacerbated by obesity and hemiplegia.  Danielle Aguilar commenced  Occupational Therapy for conservative Complete Decongestive Therapy on 10/28/24 and has undergone  more than 4 weeks CDT 2 x weekly  to the RLE. Treatment includes manual lymphatic drainage (MLD), skin care, therapeutic exercise, compression, elevation, and extensive patient education for lymphedema self-care. Pt continues to present with signs and symptoms of lymphedema, including painful bilateral lower extremity swelling, lower abdominal swelling, and palpable tissue fibrosis, which limits functional ambulation and mobility, ADLs, social participation, leisure pursuits, productive activities and quality of life (body image).  This morning Danielle Aguilar  underwent a 45-minute trial of the basic, [employer] NIMBL, 8 chambered , sequential ( distal to proximal) vaso-pneumatic compression pump on the RLE directing high protein tissue congestion towards the regional inguinal lymph nodes . Although the basic vaso-pneumatic pump does not stimulate  the lymphatic anatomy of the trunk and abdomen,  or address tissue fibrosis, it can be challenging to don and doff for a person with hemiplegia, so we started with the NIMBL today.  Pt tolerated the NIMBL device without increased pain with limb volume reductions measured in the R knee and thigh. The calfd and ankle measurements were unchanged. A sequential, pneumatic, compression device  is medically necessary to limit infection risk, to assist with wound healing, to limit lymphedema progression, and to enable increased independence with lymphedema self-management over time at home. Pt will trial the advanced, 2-piece, Flexitouch device in 4 weeks for comparison.  (12/18/24 10th Visit Progress NOTE: Pt demonstrates excellent progress towards all OT goals for lymphedema care to date. Her progress surpasses this therapist's expectations . She demonstrates diligent compliance and has a very loyal and supportive group of family and friends who continue to assist her with  compression wrapping every day. Comparative limb volumetrics of the R treatment LEG today reveal a 27.3% reduction in limb volume since commencing OT for CDT. The reduction meets and significantly exceeds the 10% limb volume reduction goal. Cellulitis infection and open , weeping wound on the R posterior leg is resolved. Skin is clean, well hydrated and pliable. Hemosiderin stain persists, but erythema has resolved and limb without signs/ symptoms of infection. Pt is habituating daily BLE skin inspection and care without verbal cues. Please review detailed progress in GOALS section below.  We refined and completed specifications and anatomical measurements for custom, R LEG compression garment and HOS device today. Pt and family educated reprocess of working with DME vendor to [employer] benefits, and educated re fitting and reordering process.    Applied multilayer wraps as established. Once RLE garment fitting is complete commence Intensive Phase CDT to L LEG. [employer] trial with NIMBL device scheduled for Monday, 12/23/24 at 11 AM.Cont as per POC. Pt continues to demonstrate excellent progress towards all OT goals for le management.)   (11/04/24 INITIAL OT EVAL: Danielle Aguilar is a 39 yo female presenting with moderate, stage II, BLE/BLQ lymphedema secondary to suspected venous insufficiency, obesity and trauma resulting from cellulitis. Chronic, progressive limb swelling and associated pain is distributed from toes to groin on the R and below the knee on the left. Lymphedema occurs secondary to trauma when the injury damages lymphatic vessels and structures in the area, leading to a buildup of protein-rich, extra-cellular fluid carrying cellular debris, waste products, bacteria, viruses, and damaged or abnormal cells in the tissues causing swelling, inflammation and pain in the affected body part. Secondary lymphedema arising from significant soft tissue trauma, like lymphedema originating  from other issues, is known to delay wound healing and contribute to infection risk, as well as balance problems. Thus, unchecked lymphedema significantly impacts Pt's with comorbid diabetes and it's complications.    Chronic, progressive lymphedema contributes to functional limitans in all occupational domains, including functional ambulation and mobility, basic and instrumental ADLs, productive activities, leisure pursuits, and social participation. Deformity related to chronic swelling and tissue changes also has a negative impact on body image, self esteem and quality of life. BLE lymphedema contributes to elevated infection risk and increased fall risk.  Danielle Aguilar will benefit from skilled  OT Complete Decongestive Therapy (CDT) the gold standard of lymphedema care, typically includes manual lymphatic drainage (MLD), skin care to limit' infection risk and increase skin excursion, lymphatic pumping exercise, and during the Intensive Phase multilayer, gradient compression bandaging to reduce limb volume. The goal of Intensive Phase CDT is to reduce swelling, reduce infection risk, limit progression,  and master  lymphedema self-care. As Pt transitions to the Self Management Phase of CDT Pt will be fit with appropriate compression garments and/ or devices and will complete a trial of the advanced, sequential, pneumatic, Flexitouch compression device for optimal, long term lymphedema management at home. Without skilled OT for lymphedema care, Pt's condition will progress, and further functional decline is expected.  REHAB POTENTIAL: Pt's prognosis for reducing limb volumes and limiting progression of chronic, progressive lymphedema is poor without daily caregiver assistance with compression during the Intensive Phase of CDT. Lymphedema treatment has a high burden of care. Due to spasticity, limited hip AROM, and impaired hand function Danielle Aguilar requires daily assistance with application of multilayer, short  stretch, knee length, compression bandages  to reduce limb volumes.   With daily caregiver assistance during the Intensive Phase of CDT Pt's prognosis for improvement in her condition is good. With modification and ADL training she may be able to don/ doff and gauge alternative , Velcro,  wrap style , compression leggings .    OBJECTIVE IMPAIRMENTS: Abnormal gait, decreased balance, decreased coordination, decreased knowledge of condition, decreased knowledge of use of DME, decreased mobility, difficulty walking, decreased ROM, increased edema, increased fascial restrictions, increased muscle spasms, impaired flexibility, impaired sensation, impaired tone, impaired UE functional use, impaired vision/preception, postural dysfunction, obesity, pain, and chronic BLE swelling with permanent skin changes and related pain.   ACTIVITY LIMITATIONS: Functional ambulation and mobility ( spasticity and limb heaviness 2/2 lymphedema limits ability to walk, perform transfers, climb curbs and stairs, carry, squat) basic and instrumental ADLS ( fit she's and lower body clothing, bathe lower body, stand and walk to perform self care and home management activities , productive activities at work, leisure pursuits and social participation in the community.    PERSONAL FACTORS: Age, Fitness, Past/current experiences, 3+ comorbidities: CP, obesity, diabetes, and limited caregiver support are also affecting patient's functional outcome. Lymphedema has a high burden of self-care.  REHAB POTENTIAL: Pt's prognosis for reducing limb volumes and limiting progression of chronic, progressive lymphedema is poor without daily caregiver assistance with compression during the Intensive Phase of CDT. Lymphedema treatment has a high burden of care. Due to spasticity, limited hip AROM, and impaired hand function Danielle Aguilar requires daily assistance with application of multilayer, short stretch, knee length, compression bandages  to reduce  limb volumes.   With daily caregiver assistance during the Intensive Phase of CDT Pt's prognosis for improvement in her condition is good. With modification and ADL training she may be able to don/ doff and gauge alternative , Velcro,  wrap style , compression leggings .    EVALUATION COMPLEXITY: Moderate  SHORT TERM GOALS: Target date: 4th OT Rx visit   Pt will demonstrate understanding of lymphedema precautions and prevention strategies with moderate assistance using a printed reference to identify at least 5 precautions and discussing how s/he may implement them into daily life to reduce risk of progression with extra time. Baseline: Dependent Goal status: MET  2.  Pt will be able to apply multilayer, thigh length, gradient, compression wraps to one leg at a time from toes to groin with max caregiver  assist to decrease limb volume, to limit infection risk, and to limit lymphedema progression.  Baseline: Dependent Goal status: MET  LONG TERM GOALS: Target date: 01/26/25  1.Given this patient's Intake score of TBA % on the Lymphedema Life Impact Scale (LLIS), patient will experience a reduction of at least 5 points in her perceived level of functional impairment resulting from lymphedema to improve functional performance and quality of life (QOL). Baseline: TBA % Goal status: DEFERRED  2.  Pt will achieve at least a 10% volume reduction in BLE to return limbs to typical size and shape, to limit infection risk and LE progression, to decrease pain, to improve function. Baseline: Dependent Goal status: PARTIALLY MET. 12/16/24 achieved dramatic 27.3% volume reduction in R LEG.  3.  Pt will obtain appropriate compression garments/devices and achieve modified independence (extra time + assistive devices) with donning/doffing to optimize limb volume reductions and limit LE progression over time. Baseline: Dependent Goal status: PROGRESSING. Completed R LEG garment measurements today  12/16/24.  4. During Intensive phase CDT, with modified independence, Pt will achieve at least 85% compliance with all adapted lymphedema self-care home program components, including daily skin care, compression wraps and /or garments, simple self MLD and lymphatic pumping therex to habituate LE self care protocol  into ADLs for optimal LE self-management over time. Baseline: Dependent Goal status: MET. Pt has a very supportive family and community of friends who assist her with compression wraps daily and ensure she has transportation to appointments.  5. Pt will be able to don and doff Flexitouch advanced sequential compression device garments correctly and operate device according to therapist determined program within  2 weeks of receiving device and undergoing manufacturer's training.  Baseline: Dependent Goal status:MET for NIMBL  MODIFIED: 12/16/24: Pt will be able to don and doff [employer] basic NIMBL sequential, vaso-pneumatic compression device garments and operate device by issue date after extending length of zipper pull and training by rep to ensure access to assistive device for daily, lymphedema self-management at home over time.  PLAN:  OT FREQUENCY: 2x/week  OT DURATION: 12-18  weeks due to bilaterality and severity. We treat one limb at a time only to limit fall risk.  PLANNED INTERVENTIONS: Complete Decongestive Therapy (CDT) to one leg a time , to include 97110-Therapeutic exercises, 97530- Therapeutic activity, 97535- Self Care, [id number]- Manual therapy, Patient/Family education, Manual lymph drainage, Compression bandaging and fitting with accessible, comfortable ,effective , day and nighttime compression garments, DME instructions, and skin care to reduce infection risk  PLAN FOR NEXT SESSION:  MLD Skin care Compression wrapping Pt and family edu   Zebedee Dec, Danielle, OTR/L, CLT-LANA 12/23/24 11:53 AM  "

## 2024-12-25 ENCOUNTER — Ambulatory Visit: Admitting: Occupational Therapy

## 2024-12-25 DIAGNOSIS — I890 Lymphedema, not elsewhere classified: Secondary | ICD-10-CM

## 2024-12-25 NOTE — Therapy (Signed)
 " OUTPATIENT OCCUPATIONAL THERAPY TREATMENT NOTE   RIGHT LOWER EXTREMITY/  RIGHT LOWER QUADRANT  LYMPHEDEMA  Patient Name: Danielle Aguilar MRN: [id number] DOB:1986-03-20, 39 y.o., female Today's Date: 12/25/2024  REPORTING PERIOD:   END OF SESSION:  OT End of Session - 12/25/24 1421     Visit Number 13    Number of Visits 36    Date for Recertification  01/26/25    OT Start Time 0210    OT Stop Time 0311    OT Time Calculation (min) 61 min    Activity Tolerance Patient tolerated treatment well;No increased pain;Other (comment)   CP with R hemiplegia   Behavior During Therapy WFL for tasks assessed/performed            Past Medical History:  Diagnosis Date   Diabetes mellitus without complication (HCC)    No past surgical history on file. Patient Active Problem List   Diagnosis Date Noted   Allergic rhinitis 02/23/2022   Cerebral palsy (HCC) 02/23/2022   Chronic kidney disease, stage 1 02/23/2022   Diabetic renal disease (HCC) 02/23/2022   Gastro-esophageal reflux disease without esophagitis 02/23/2022   Hemiplegia of dominant side (HCC) 02/23/2022   Morbid obesity (HCC) 02/23/2022   Pure hypercholesterolemia 02/23/2022   Type 2 diabetes mellitus with diabetic nephropathy (HCC) 02/23/2022   Uncontrolled type 2 diabetes mellitus with hyperglycemia (HCC) 08/14/2018    PCP: Velna Barrow, MD  REFERRING PROVIDER: same  REFERRING DIAG: I89.0  THERAPY DIAG:  Lymphedema, not elsewhere classified  Rationale for Evaluation and Treatment: Rehabilitation  ONSET DATE: several years ago. No known precipitating event  SUBJECTIVE:                                                                                                                                                                                           SUBJECTIVE STATEMENT: Danielle Aguilar presents to OT for lymphedema care to bilateral legs, with  RLE/RLQ> L. Pt is accompanied by her mother today.  Pt reports LE  related leg pain is 0/10.   (10/28/24 Initial OT Eval: Danielle Aguilar is referred to Occupational Therapy by [name], MD, for evaluation and treatment of BLE lymphedema. Pt. Is accompanied by her mother, Danielle Aguilar. Pt ambulates to clinic with ataxic gait using extra time. She reports the pain, redness, and swelling in her right leg and foot worsened a lot during a recent first-ever episode of RLE cellulitis,  which required oral antibiotics Keflex and doxycycline. Since the cellulitis ~ 2 months ago Pt applies skin lotion ( brand unknown) 2 x daily, and applies a large gob of Vaseline at night with  clean calf-length socks on top. Pt denies lymphorrhea. Pt states she has had leg swelling for a long time. She does not know exactly when it started. )  PERTINENT HISTORY:  Chronic venous stasis dermatitis,  10/03/24 BLE Doppler ruled out DVT Uncontrolled DM Type 2 CP w R hemiplegia Myopia Diabetic renal disease Obesity (BMI 43.1) BLE Lymphedema Recent cellulitis R Leg  PAIN:  Are you having pain? Not rated numerically; heavy, itchy, aching Decreases: unknown Increases: standing, walking, sitting  PRECAUTIONS: Fall and Other: LYMPHEDEMA: skin precautions 2/2 DM  RED FLAGS: None   WEIGHT BEARING RESTRICTIONS: No  FALLS:  Has patient fallen in last 6 months? No  LIVING ENVIRONMENT: Lives with: lives alone Lives in: House/apartment Stairs: none Has following equipment at home: None  OCCUPATION: Works part time at [employer]- requires standing and walking  EDUCATION: HS  LEISURE: friends and family time  HAND DOMINANCE: left   PRIOR LEVEL OF FUNCTION: extra time for functional ambulation and transfers, needs AD for self care  PATIENT GOALS: get swelling down and limit recurrence of cellulitis  OBJECTIVE: Moderate, BLE, Stage  II Lymphedema 2/2 suspected CVI and Obesity, R>L  Note: Objective measures were completed at Evaluation unless otherwise noted.  COGNITION:  Some  difficulty with medical hx. Asks excellent questions. Fully participates in eval with appropriate affect. Processing speed slightly delayed.  OBSERVATIONS / OTHER ASSESSMENTS:  POSTURE: R hemiplegia; spasticity  LE ROM: N/T due to spasticity.  Limited ability bend to over to reach feet and distal legs with hands   LYMPHEDEMA ASSESSMENTS:   SURGERY TYPE/DATE: N/A  NUMBER OF LYMPH NODES REMOVED: N/A  CHEMOTHERAPY: N/A  RADIATION:N/A  HORMONE TREATMENT: N/A  INFECTIONS: s/p R leg / foot cellulitis . Rx with oral Keflex and doxycycline   BLE COMPARATIVE LIMB VOLUMETRICS INITIAL 11/04/24  LANDMARK RIGHT    R LEG (A-D) 6737.9 ml  R THIGH (E-G) ml  R FULL LIMB (A-G) ml  Limb Volume differential (LVD)  LVD of LEGS measures 18%, L>R. Typically limb volume differential favors the dominant limb by 3-4 %.  Volume change since initial %  Volume change overall V  (Blank rows = not tested)  LANDMARK LEFT  (L functionally dominant 2/2 R hemiplegia)  L LEG (A-D) 5527.9 ml  L THIGH (E-G) ml  L FULL LIMB (A-G) ml  Limb Volume differential (LVD)  %  Volume change since initial %  Volume change overall %  (Blank rows = not tested)     RLE COMPARATIVE LIMB VOLUMETRICS 10 th visit PROGRESS REPORT 12/16/24  LANDMARK RIGHT    R LEG (A-D) 4916. 2 ml  R THIGH (E-G) ml  R FULL LIMB (A-G) ml  Limb Volume differential (LVD)    Volume change since initial measurements on 11/04/24 T LEG (AD) volume is reduced by 27.3 % since initially measured on 11/04/24.  Volume change overall V    SKIN CONDITION/TISSUE INTEGRITY:  Skin  Description Hyper- Keratosis Peau d d'Orange Shiny Tight Fibrotic/ Indurated Fatty Doughy Spongy/ boggy    Fibrotic skin thickening x  x x       Skin dry Flaky WNL Macerated Crusty Exudate   x        Color Redness Varicosities Blanching Hemosiderin Stain Mottled   x  x   x   Odor Malodorous Yeast Fungal infection  WNL      x   Temperature Warm Cool wnl     x     Pitting Edema  1+ 2+ 3+ 4+ Non-pitting         x   Girth Symmetrical Asymmetrical                   Distribution    R>L toes to groin on R; toes to popliteal on L    Stemmer Sign Positive Negative   +    Lymphorrhea History Of:  Present Absent   x      Wounds History Of Present Absent Venous Arterial Pressure Non Pressure Ulcer   x  Resolved R posterior leg wound        Signs of Infection Redness Warmth Erythema Acute  Swelling Drainage Borders   x                 Sensation Light Touch Deep pressure Hypersensitivity   In tact Impaired In tact Impaired Absent Impaired   Difficult to assess    x     Nails WNL   Fungus nail dystrophy     Severe, discolored    Hair Growth Symmetrical Asymmetrical   x    Skin Creases Base of toes  Ankles   Base of Fingers knees       Abdominal pannus Thigh Lobules  Face/neck   x          GAIT: Distance walked: ataxic gait Assistive device utilized: None Level of assistance: Modified independence Comments: achilles sx as a child  LYMPHEDEMA LIFE IMPACT SCALE (LLIS): DEFERRED (The extent to which LE-related problems interfered with your life  over the last week)                                                                                                                            TREATMENT this DATE:  Pt and family edu re basic and advanced compression device Comparative limb circumferences Compression wrapping as established to R LEG  PATIENT/ FAMILY EDUCATION:  Pt/ family education for basic and advanced sequential pneumatic compression devices, or pump, including clinical indications, how they work, how the basic device differs from the advanced, clinical indications, contraindications and precautions and care and use routines for pneumatic compression devices. Patient/ caregiver's questions were answered throughout trial and handouts and Internet resources given for reference.  PRECAUTIONS: Pt education re  precautions related to use of advanced sequential pneumatic compression device. Pt verbalized understanding that she should never use device on 2 arms/legs simultaneously and should not use device 2 x on same day to avoid overloading her heart with fluid volume return both at present, and in years to come should her medical condition change. Pt instructed to remove device immediately should she experience atypical SOP, light headedness, or acute pain. Pt instructed to discontinue pump if she suspects, or has any infection, blood clot, cellulitis, the flu, corona virus, etc.  LYMPHEDEMA SELF-CARE HOME PROGRAM: Fit Pt with appropriate day and night time compression garments and devices that meet severity lymphedema and occupational demands.  Fit Pt with quantity 3 for R LEG and L for L LEG, custom, BLE, knee length, flat knit, ELVAREX SOFT, ccl 2 ( 25-32 mmHg) with oblique top edge, slat open toe, T heel and silicone top band to control swelling, to limit infection risk non-healing wounds, to limit lymphedema progression, and to limit further functional decline.   Fit Pt with bilateral, knee length, custom, ccl 1 ( 18-21 mmHg) Jobst RELAX convoluted HOS devices with ZIPPERS to facilitate optimal lymphatic flow during HOS and to limit fibrosis formation.  Donning and doffing aides are medically necessary to assist Pt with hemiplegia and limited R arm and hand function to access compression garments and devices as independently as possible. Pt lives alone and does not have readily available assistance on a daily basis. Medically necessary assistive devices include  1 Mediven stocking BUTLER OFF,  1 Mediven stocking BUTLER ON Frame, quantity 1 Arion Easy Slide donning aide for open toe, and quantity 1 pair Jobst friction Donning Gloves size small.  Appropriate day and night time compression garments and devices must be matched with severity of lymphedema and must meet occupational demands. Custom-made gradient  compression garments and HOS devices are medically necessary because they are uniquely sized and shaped to fit the exact dimensions of the affected extremities, and to provide appropriate medical grade, graduated compression essential for optimally managing chronic, progressive lymphedema. Multiple custom compression garments are needed to ensure proper hygiene to limit infection risk. Custom compression garments should be replaced q 3-6 months When worn consistently for optimal lymphedema self-management over time. HOS devices, medically necessary to limit fibrosis buildup in tissue, should be replaced q 2 years and PRN when worn out.    BLE lymphatic pumping there ex- 1 set of 10 each element, in order. Hold 5. 2 x daily  Daily, short stretch, thigh or knee length, multilayer compression bandages to one leg at a time during Intensive Phase CDT  4. Pt will benefit from a daily use of a basic, or an advanced, sequential, pneumatic , compression device for optimal LE self management of LE at home over time. Pt would most benefit from the T[employer]ile Medical Flexitouch advanced device, but she lives alone and may have difficulty don and doff the garments associated with this device without max assistance. Instead Pt should be fit with the basic [employer] NIMBL device, which is accessible for her. She'll trial the Flexitouch 4 weeks later for comparison.  5. Daily simple self MLD used in conjunction with pneumatic device to engage thoracic duct return and to stimulate the terminus LNs in subclavian region.  6. Daily skin inspection and skin care with low ph lotion matching skin ph to reduce infection risk.  7. Leg elevation when seated    ASSESSMENT:  CLINICAL IMPRESSION: Pt tolerated MLD to RLE/RLQ without increased pain. Re-Applied multilayer wraps as established. Once RLE garment fitting is complete commence Intensive Phase CDT to L LEG. Pt continues to make steady progress towards all OT goals.  Cont as per POC.   (12/23/24 T[employer]ile [employer] NIMBL trial: Danielle Aguilar presents with moderate, stage II, BLE/BLQ lymphedema 2/2 CVI and exacerbated by obesity and hemiplegia.  Ms Aguilar commenced Occupational Therapy for conservative Complete Decongestive Therapy on 10/28/24 and has undergone  more than 4 weeks CDT 2 x weekly  to the RLE. Treatment includes manual lymphatic drainage (MLD), skin care, therapeutic exercise, compression, elevation, and extensive patient education for lymphedema self-care. Pt continues to present with signs and symptoms of  lymphedema, including painful bilateral lower extremity swelling, lower abdominal swelling, and palpable tissue fibrosis, which limits functional ambulation and mobility, ADLs, social participation, leisure pursuits, productive activities and quality of life (body image).  This morning Danielle Aguilar underwent a 45-minute trial of the basic, T[employer]ile [employer] NIMBL, 8 chambered , sequential ( distal to proximal) vaso-pneumatic compression pump on the RLE directing high protein tissue congestion towards the regional inguinal lymph nodes . Although the basic vaso-pneumatic pump does not stimulate  the lymphatic anatomy of the trunk and abdomen,  or address tissue fibrosis, it can be challenging to don and doff for a person with hemiplegia, so we started with the NIMBL today.  Pt tolerated the NIMBL device without increased pain with limb volume reductions measured in the R knee and thigh. The calf and ankle measurements were unchanged. A sequential, pneumatic, compression device  is medically necessary to limit infection risk, to assist with wound healing, to limit lymphedema progression, and to enable increased independence with lymphedema self-management over time at home. Pt will trial the advanced, 2-piece, Flexitouch device in 4 weeks for comparison.)  (12/18/24 10 th Visit Progress NOTE: Pt demonstrates excellent progress towards all OT goals for lymphedema care  to date. Her progress surpasses this therapist's expectations . She demonstrates diligent compliance and has a very loyal and supportive group of family and friends who continue to assist her with compression wrapping every day. Comparative limb volumetrics of the R treatment LEG today reveal a 27.3% reduction in limb volume since commencing OT for CDT. The reduction meets and significantly exceeds the 10% limb volume reduction goal. Cellulitis infection and open , weeping wound on the R posterior leg is resolved. Skin is clean, well hydrated and pliable. Hemosiderin stain persists, but erythema has resolved and limb without signs/ symptoms of infection. Pt is habituating daily BLE skin inspection and care without verbal cues. Please review detailed progress in GOALS section below.  We refined and completed specifications and anatomical measurements for custom, R LEG compression garment and HOS device today. Pt and family educated reprocess of working with DME vendor to [employer] benefits, and educated re fitting and reordering process.    Applied multilayer wraps as established. Once RLE garment fitting is complete commence Intensive Phase CDT to L LEG. T[employer] trial with NIMBL device scheduled for Monday, 12/23/24 at 11 AM.Cont as per POC. Pt continues to demonstrate excellent progress towards all OT goals for le management.)   (11/04/24 INITIAL OT EVAL: Danielle Aguilar is a 39 yo female presenting with moderate, stage II, BLE/BLQ lymphedema secondary to suspected venous insufficiency, obesity and trauma resulting from cellulitis. Chronic, progressive limb swelling and associated pain is distributed from toes to groin on the R and below the knee on the left. Lymphedema occurs secondary to trauma when the injury damages lymphatic vessels and structures in the area, leading to a buildup of protein-rich, extra-cellular fluid carrying cellular debris, waste products, bacteria, viruses, and damaged  or abnormal cells in the tissues causing swelling, inflammation and pain in the affected body part. Secondary lymphedema arising from significant soft tissue trauma, like lymphedema originating from other issues, is known to delay wound healing and contribute to infection risk, as well as balance problems. Thus, unchecked lymphedema significantly impacts Pt's with comorbid diabetes and it's complications.    Chronic, progressive lymphedema contributes to functional limitans in all occupational domains, including functional ambulation and mobility, basic and instrumental ADLs, productive activities, leisure pursuits, and social participation. Deformity  related to chronic swelling and tissue changes also has a negative impact on body image, self esteem and quality of life. BLE lymphedema contributes to elevated infection risk and increased fall risk.  Danielle Aguilar will benefit from skilled OT Complete Decongestive Therapy (CDT) the gold standard of lymphedema care, typically includes manual lymphatic drainage (MLD), skin care to limit' infection risk and increase skin excursion, lymphatic pumping exercise, and during the Intensive Phase multilayer, gradient compression bandaging to reduce limb volume. The goal of Intensive Phase CDT is to reduce swelling, reduce infection risk, limit progression,  and master  lymphedema self-care. As Pt transitions to the Self Management Phase of CDT Pt will be fit with appropriate compression garments and/ or devices and will complete a trial of the advanced, sequential, pneumatic, Flexitouch compression device for optimal, long term lymphedema management at home. Without skilled OT for lymphedema care, Pt's condition will progress, and further functional decline is expected.  REHAB POTENTIAL: Pt's prognosis for reducing limb volumes and limiting progression of chronic, progressive lymphedema is poor without daily caregiver assistance with compression during the Intensive Phase of  CDT. Lymphedema treatment has a high burden of care. Due to spasticity, limited hip AROM, and impaired hand function Danielle Aguilar requires daily assistance with application of multilayer, short stretch, knee length, compression bandages  to reduce limb volumes.   With daily caregiver assistance during the Intensive Phase of CDT Pt's prognosis for improvement in her condition is good. With modification and ADL training she may be able to don/ doff and gauge alternative , Velcro,  wrap style , compression leggings .    OBJECTIVE IMPAIRMENTS: Abnormal gait, decreased balance, decreased coordination, decreased knowledge of condition, decreased knowledge of use of DME, decreased mobility, difficulty walking, decreased ROM, increased edema, increased fascial restrictions, increased muscle spasms, impaired flexibility, impaired sensation, impaired tone, impaired UE functional use, impaired vision/preception, postural dysfunction, obesity, pain, and chronic BLE swelling with permanent skin changes and related pain.   ACTIVITY LIMITATIONS: Functional ambulation and mobility ( spasticity and limb heaviness 2/2 lymphedema limits ability to walk, perform transfers, climb curbs and stairs, carry, squat) basic and instrumental ADLS ( fit she's and lower body clothing, bathe lower body, stand and walk to perform self care and home management activities , productive activities at work, leisure pursuits and social participation in the community.    PERSONAL FACTORS: Age, Fitness, Past/current experiences, 3+ comorbidities: CP, obesity, diabetes, and limited caregiver support are also affecting patient's functional outcome. Lymphedema has a high burden of self-care.  REHAB POTENTIAL: Pt's prognosis for reducing limb volumes and limiting progression of chronic, progressive lymphedema is poor without daily caregiver assistance with compression during the Intensive Phase of CDT. Lymphedema treatment has a high burden of care.  Due to spasticity, limited hip AROM, and impaired hand function Danielle Aguilar requires daily assistance with application of multilayer, short stretch, knee length, compression bandages  to reduce limb volumes.   With daily caregiver assistance during the Intensive Phase of CDT Pt's prognosis for improvement in her condition is good. With modification and ADL training she may be able to don/ doff and gauge alternative , Velcro,  wrap style , compression leggings .    EVALUATION COMPLEXITY: Moderate  SHORT TERM GOALS: Target date: 4th OT Rx visit   Pt will demonstrate understanding of lymphedema precautions and prevention strategies with moderate assistance using a printed reference to identify at least 5 precautions and discussing how s/he may implement them into daily life to reduce  risk of progression with extra time. Baseline: Dependent Goal status: MET  2.  Pt will be able to apply multilayer, thigh length, gradient, compression wraps to one leg at a time from toes to groin with max caregiver assist to decrease limb volume, to limit infection risk, and to limit lymphedema progression.  Baseline: Dependent Goal status: MET  LONG TERM GOALS: Target date: 01/26/25  1.Given this patient's Intake score of TBA % on the Lymphedema Life Impact Scale (LLIS), patient will experience a reduction of at least 5 points in her perceived level of functional impairment resulting from lymphedema to improve functional performance and quality of life (QOL). Baseline: TBA % Goal status: DEFERRED  2.  Pt will achieve at least a 10% volume reduction in BLE to return limbs to typical size and shape, to limit infection risk and LE progression, to decrease pain, to improve function. Baseline: Dependent Goal status: PARTIALLY MET. 12/16/24 achieved dramatic 27.3% volume reduction in R LEG.  3.  Pt will obtain appropriate compression garments/devices and achieve modified independence (extra time + assistive devices)  with donning/doffing to optimize limb volume reductions and limit LE progression over time. Baseline: Dependent Goal status: PROGRESSING. Completed R LEG garment measurements today 12/16/24.  4. During Intensive phase CDT, with modified independence, Pt will achieve at least 85% compliance with all adapted lymphedema self-care home program components, including daily skin care, compression wraps and /or garments, simple self MLD and lymphatic pumping therex to habituate LE self care protocol  into ADLs for optimal LE self-management over time. Baseline: Dependent Goal status: MET. Pt has a very supportive family and community of friends who assist her with compression wraps daily and ensure she has transportation to appointments.  5. Pt will be able to don and doff Flexitouch advanced sequential compression device garments correctly and operate device according to therapist determined program within  2 weeks of receiving device and undergoing manufacturer's training.  Baseline: Dependent Goal status:MET for NIMBL  MODIFIED: 12/16/24: Pt will be able to don and doff [employer] basic NIMBL sequential, vaso-pneumatic compression device garments and operate device by issue date after extending length of zipper pull and training by rep to ensure access to assistive device for daily, lymphedema self-management at home over time.  PLAN:  OT FREQUENCY: 2x/week  OT DURATION: 12-18  weeks due to bilaterality and severity. We treat one limb at a time only to limit fall risk.  PLANNED INTERVENTIONS: Complete Decongestive Therapy (CDT) to one leg a time , to include 97110-Therapeutic exercises, 97530- Therapeutic activity, 97535- Self Care, [id number]- Manual therapy, Patient/Family education, Manual lymph drainage, Compression bandaging and fitting with accessible, comfortable ,effective , day and nighttime compression garments, DME instructions, and skin care to reduce infection risk  PLAN FOR NEXT SESSION:   MLD Skin care Compression wrapping Pt and family edu   Zebedee Dec, MS, OTR/L, CLT-LANA 12/25/24 3:13 PM  "

## 2024-12-30 ENCOUNTER — Ambulatory Visit: Admitting: Occupational Therapy

## 2025-01-01 ENCOUNTER — Ambulatory Visit: Admitting: Occupational Therapy

## 2025-01-01 DIAGNOSIS — I890 Lymphedema, not elsewhere classified: Secondary | ICD-10-CM

## 2025-01-01 NOTE — Therapy (Signed)
 " OUTPATIENT OCCUPATIONAL THERAPY TREATMENT NOTE   RIGHT LOWER EXTREMITY/  RIGHT LOWER QUADRANT  LYMPHEDEMA  Patient Name: Danielle Aguilar MRN: [id number] DOB:[birth year], 39 y.o., female Today's Date: 01/01/2025  REPORTING PERIOD:   END OF SESSION:  OT End of Session - 01/01/25 1014     Visit Number 14    Number of Visits 36    Date for Recertification  01/26/25    OT Start Time 1000    OT Stop Time 1110    OT Time Calculation (min) 70 min    Activity Tolerance Patient tolerated treatment well;No increased pain;Other (comment)   CP with R hemiplegia   Behavior During Therapy WFL for tasks assessed/performed            Past Medical History:  Diagnosis Date   Diabetes mellitus without complication (HCC)    No past surgical history on file. Patient Active Problem List   Diagnosis Date Noted   Allergic rhinitis 02/23/2022   Cerebral palsy (HCC) 02/23/2022   Chronic kidney disease, stage 1 02/23/2022   Diabetic renal disease (HCC) 02/23/2022   Gastro-esophageal reflux disease without esophagitis 02/23/2022   Hemiplegia of dominant side (HCC) 02/23/2022   Morbid obesity (HCC) 02/23/2022   Pure hypercholesterolemia 02/23/2022   Type 2 diabetes mellitus with diabetic nephropathy (HCC) 02/23/2022   Uncontrolled type 2 diabetes mellitus with hyperglycemia (HCC) 08/14/2018    PCP: Velna Barrow, MD  REFERRING PROVIDER: same  REFERRING DIAG: I89.0  THERAPY DIAG:  Lymphedema, not elsewhere classified  Rationale for Evaluation and Treatment: Rehabilitation  ONSET DATE: several years ago. No known precipitating event  SUBJECTIVE:                                                                                                                                                                                           SUBJECTIVE STATEMENT: [name] presents to OT for lymphedema care to bilateral legs, with  RLE/RLQ> L. Pt is accompanied by her mother today.  Pt reports LE  related leg pain is 0/10. She reports she had no problem managing lymphedema wraps during interval with assistance from family and friends.Pt states she was very glad to be able to take a complete shower 2 x over the weekend. Pt's mother, Armida, reports she ordered a goose neck mirror for skin inspection on feet and legs for Danielle Aguilar, but it hasn't arrived yet.  (10/28/24 Initial OT Eval: [name] is referred to Occupational Therapy by [name], MD, for evaluation and treatment of BLE lymphedema. Pt. Is accompanied by her mother, [name]. Pt ambulates to clinic with ataxic gait using extra time. She  reports the pain, redness, and swelling in her right leg and foot worsened a lot during a recent first-ever episode of RLE cellulitis,  which required oral antibiotics Keflex and doxycycline. Since the cellulitis ~ 2 months ago Pt applies skin lotion ( brand unknown) 2 x daily, and applies a large gob of Vaseline at night with clean calf-length socks on top. Pt denies lymphorrhea. Pt states she has had leg swelling for a long time. She does not know exactly when it started. )  PERTINENT HISTORY:  Chronic venous stasis dermatitis,  10/03/24 BLE Doppler ruled out DVT Uncontrolled DM Type 2 CP w R hemiplegia Myopia Diabetic renal disease Obesity (BMI 43.1) BLE Lymphedema Recent cellulitis R Leg  PAIN:  Are you having pain? Not rated numerically; heavy, itchy, aching Decreases: unknown Increases: standing, walking, sitting  PRECAUTIONS: Fall and Other: LYMPHEDEMA: skin precautions 2/2 DM  RED FLAGS: None   WEIGHT BEARING RESTRICTIONS: No  FALLS:  Has patient fallen in last 6 months? No  LIVING ENVIRONMENT: Lives with: lives alone Lives in: House/apartment Stairs: none Has following equipment at home: None  OCCUPATION: Works part time at [employer]- requires standing and walking  EDUCATION: HS  LEISURE: friends and family time  HAND DOMINANCE: left   PRIOR LEVEL OF FUNCTION:  extra time for functional ambulation and transfers, needs AD for self care  PATIENT GOALS: get swelling down and limit recurrence of cellulitis  OBJECTIVE: Moderate, BLE, Stage  II Lymphedema 2/2 suspected CVI and Obesity, R>L  Note: Objective measures were completed at Evaluation unless otherwise noted.  COGNITION:  Some difficulty with medical hx. Asks excellent questions. Fully participates in eval with appropriate affect. Processing speed slightly delayed.  OBSERVATIONS / OTHER ASSESSMENTS:  POSTURE: R hemiplegia; spasticity  LE ROM: N/T due to spasticity.  Limited ability bend to over to reach feet and distal legs with hands   LYMPHEDEMA ASSESSMENTS:   SURGERY TYPE/DATE: N/A  NUMBER OF LYMPH NODES REMOVED: N/A  CHEMOTHERAPY: N/A  RADIATION:N/A  HORMONE TREATMENT: N/A  INFECTIONS: s/p R leg / foot cellulitis . Rx with oral Keflex and doxycycline   BLE COMPARATIVE LIMB VOLUMETRICS INITIAL 11/04/24  LANDMARK RIGHT    R LEG (A-D) 6737.9 ml  R THIGH (E-G) ml  R FULL LIMB (A-G) ml  Limb Volume differential (LVD)  LVD of LEGS measures 18%, L>R. Typically limb volume differential favors the dominant limb by 3-4 %.  Volume change since initial %  Volume change overall V  (Blank rows = not tested)  LANDMARK LEFT  (L functionally dominant 2/2 R hemiplegia)  L LEG (A-D) 5527.9 ml  L THIGH (E-G) ml  L FULL LIMB (A-G) ml  Limb Volume differential (LVD)  %  Volume change since initial %  Volume change overall %  (Blank rows = not tested)     RLE COMPARATIVE LIMB VOLUMETRICS 10 th visit PROGRESS REPORT 12/16/24  LANDMARK RIGHT    R LEG (A-D) 4916. 2 ml  R THIGH (E-G) ml  R FULL LIMB (A-G) ml  Limb Volume differential (LVD)    Volume change since initial measurements on 11/04/24 T LEG (AD) volume is reduced by 27.3 % since initially measured on 11/04/24.  Volume change overall V    SKIN CONDITION/TISSUE INTEGRITY:  Skin  Description Hyper- Keratosis Peau d d'Orange  Shiny Tight Fibrotic/ Indurated Fatty Doughy Spongy/ boggy    Fibrotic skin thickening x  x x       Skin dry Flaky WNL Macerated Crusty  Exudate   x        Color Redness Varicosities Blanching Hemosiderin Stain Mottled   x  x   x   Odor Malodorous Yeast Fungal infection  WNL      x   Temperature Warm Cool wnl     x    Pitting Edema   1+ 2+ 3+ 4+ Non-pitting         x   Girth Symmetrical Asymmetrical                   Distribution    R>L toes to groin on R; toes to popliteal on L    Stemmer Sign Positive Negative   +    Lymphorrhea History Of:  Present Absent   x      Wounds History Of Present Absent Venous Arterial Pressure Non Pressure Ulcer   x  Resolved R posterior leg wound        Signs of Infection Redness Warmth Erythema Acute  Swelling Drainage Borders   x                 Sensation Light Touch Deep pressure Hypersensitivity   In tact Impaired In tact Impaired Absent Impaired   Difficult to assess    x     Nails WNL   Fungus nail dystrophy     Severe, discolored    Hair Growth Symmetrical Asymmetrical   x    Skin Creases Base of toes  Ankles   Base of Fingers knees       Abdominal pannus Thigh Lobules  Face/neck   x          GAIT: Distance walked: ataxic gait Assistive device utilized: None Level of assistance: Modified independence Comments: achilles sx as a child  LYMPHEDEMA LIFE IMPACT SCALE (LLIS): DEFERRED (The extent to which LE-related problems interfered with your life  over the last week)                                                                                                                            TREATMENT this DATE:  Pt and family edu re LE self care MLD to RLE/RLQ with simultaneous skin care Compression wrapping as established to R LEG  PATIENT/ FAMILY EDUCATION:  Pt/ family education for basic and advanced sequential pneumatic compression devices, or pump, including clinical indications, how they  work, how the basic device differs from the advanced, clinical indications, contraindications and precautions and care and use routines for pneumatic compression devices. Patient/ caregiver's questions were answered throughout trial and handouts and Internet resources given for reference.  PRECAUTIONS: Pt education re precautions related to use of advanced sequential pneumatic compression device. Pt verbalized understanding that she should never use device on 2 arms/legs simultaneously and should not use device 2 x on same day to avoid overloading her heart with fluid volume return both at present, and in years to come should her medical condition change.  Pt instructed to remove device immediately should she experience atypical SOP, light headedness, or acute pain. Pt instructed to discontinue pump if she suspects, or has any infection, blood clot, cellulitis, the flu, corona virus, etc.  LYMPHEDEMA SELF-CARE HOME PROGRAM: Fit Pt with appropriate day and night time compression garments and devices that meet severity lymphedema and occupational demands.  Fit Pt with quantity 3 for R LEG and L for L LEG, custom, BLE, knee length, flat knit, ELVAREX SOFT, ccl 2 ( 25-32 mmHg) with oblique top edge, slat open toe, T heel and silicone top band to control swelling, to limit infection risk non-healing wounds, to limit lymphedema progression, and to limit further functional decline.   Fit Pt with bilateral, knee length, custom, ccl 1 ( 18-21 mmHg) Jobst RELAX convoluted HOS devices with ZIPPERS to facilitate optimal lymphatic flow during HOS and to limit fibrosis formation.  Donning and doffing aides are medically necessary to assist Pt with hemiplegia and limited R arm and hand function to access compression garments and devices as independently as possible. Pt lives alone and does not have readily available assistance on a daily basis. Medically necessary assistive devices include  1 Mediven stocking BUTLER OFF,   1 Mediven stocking BUTLER ON Frame, quantity 1 Arion Easy Slide donning aide for open toe, and quantity 1 pair Jobst friction Donning Gloves size small.  Appropriate day and night time compression garments and devices must be matched with severity of lymphedema and must meet occupational demands. Custom-made gradient compression garments and HOS devices are medically necessary because they are uniquely sized and shaped to fit the exact dimensions of the affected extremities, and to provide appropriate medical grade, graduated compression essential for optimally managing chronic, progressive lymphedema. Multiple custom compression garments are needed to ensure proper hygiene to limit infection risk. Custom compression garments should be replaced q 3-6 months When worn consistently for optimal lymphedema self-management over time. HOS devices, medically necessary to limit fibrosis buildup in tissue, should be replaced q 2 years and PRN when worn out.    BLE lymphatic pumping there ex- 1 set of 10 each element, in order. Hold 5. 2 x daily  Daily, short stretch, thigh or knee length, multilayer compression bandages to one leg at a time during Intensive Phase CDT  4. Pt will benefit from a daily use of a basic, or an advanced, sequential, pneumatic , compression device for optimal LE self management of LE at home over time. Pt would most benefit from the [employer] Flexitouch advanced device, but she lives alone and may have difficulty don and doff the garments associated with this device without max assistance. Instead Pt should be fit with the basic [employer] NIMBL device, which is accessible for her. She'll trial the Flexitouch 4 weeks later for comparison.  5. Daily simple self MLD used in conjunction with pneumatic device to engage thoracic duct return and to stimulate the terminus LNs in subclavian region.  6. Daily skin inspection and skin care with low ph lotion matching skin ph to reduce  infection risk.  7. Leg elevation when seated    ASSESSMENT:  CLINICAL IMPRESSION: Pt's compliance with LE self care is becoming more and more habituated over time as she takes responsibility for communicating with her referring provider about expediting to DME vendor for garments and vaso pneumatic device. Pt is also doing a very nice job with helping to coordinate assistance with wraps between visits, and with performing daily skin care. Soon she'll  have a gooseneck mirror to use to inspect her feet and skin on distal legs in keeping with diabetes precautions. Pt tolerated MLD to RLE/RLQ without increased pain. Re-Applied multilayer wraps as established. Once RLE garment fitting is complete commence Intensive Phase CDT to L LEG. Pt continues to make steady progress towards all OT goals. Cont as per POC.   (12/23/24 T[employer]ile [employer] NIMBL trial: Reisa Coppola presents with moderate, stage II, BLE/BLQ lymphedema 2/2 CVI and exacerbated by obesity and hemiplegia.  Ms Hepler commenced Occupational Therapy for conservative Complete Decongestive Therapy on 10/28/24 and has undergone  more than 4 weeks CDT 2 x weekly  to the RLE. Treatment includes manual lymphatic drainage (MLD), skin care, therapeutic exercise, compression, elevation, and extensive patient education for lymphedema self-care. Pt continues to present with signs and symptoms of lymphedema, including painful bilateral lower extremity swelling, lower abdominal swelling, and palpable tissue fibrosis, which limits functional ambulation and mobility, ADLs, social participation, leisure pursuits, productive activities and quality of life (body image).  This morning Ms. Klauer underwent a 45-minute trial of the basic, Tactile [employer] NIMBL, 8 chambered , sequential ( distal to proximal) vaso-pneumatic compression pump on the RLE directing high protein tissue congestion towards the regional inguinal lymph nodes . Although the basic vaso-pneumatic pump  does not stimulate  the lymphatic anatomy of the trunk and abdomen,  or address tissue fibrosis, it can be challenging to don and doff for a person with hemiplegia, so we started with the NIMBL today.  Pt tolerated the NIMBL device without increased pain with limb volume reductions measured in the R knee and thigh. The calf and ankle measurements were unchanged. A sequential, pneumatic, compression device  is medically necessary to limit infection risk, to assist with wound healing, to limit lymphedema progression, and to enable increased independence with lymphedema self-management over time at home. Pt will trial the advanced, 2-piece, Flexitouch device in 4 weeks for comparison.)  (12/18/24 10 th Visit Progress NOTE: Pt demonstrates excellent progress towards all OT goals for lymphedema care to date. Her progress surpasses this therapist's expectations . She demonstrates diligent compliance and has a very loyal and supportive group of family and friends who continue to assist her with compression wrapping every day. Comparative limb volumetrics of the R treatment LEG today reveal a 27.3% reduction in limb volume since commencing OT for CDT. The reduction meets and significantly exceeds the 10% limb volume reduction goal. Cellulitis infection and open , weeping wound on the R posterior leg is resolved. Skin is clean, well hydrated and pliable. Hemosiderin stain persists, but erythema has resolved and limb without signs/ symptoms of infection. Pt is habituating daily BLE skin inspection and care without verbal cues. Please review detailed progress in GOALS section below.  We refined and completed specifications and anatomical measurements for custom, R LEG compression garment and HOS device today. Pt and family educated reprocess of working with DME vendor to [employer] benefits, and educated re fitting and reordering process.    Applied multilayer wraps as established. Once RLE garment fitting is  complete commence Intensive Phase CDT to L LEG. T[employer]ile [employer] trial with NIMBL device scheduled for Monday, 12/23/24 at 11 AM.Cont as per POC. Pt continues to demonstrate excellent progress towards all OT goals for le management.)   (11/04/24 INITIAL OT EVAL: SHASTA CHINN is a 39 yo female presenting with moderate, stage II, BLE/BLQ lymphedema secondary to suspected venous insufficiency, obesity and trauma resulting from cellulitis. Chronic, progressive limb swelling  and associated pain is distributed from toes to groin on the R and below the knee on the left. Lymphedema occurs secondary to trauma when the injury damages lymphatic vessels and structures in the area, leading to a buildup of protein-rich, extra-cellular fluid carrying cellular debris, waste products, bacteria, viruses, and damaged or abnormal cells in the tissues causing swelling, inflammation and pain in the affected body part. Secondary lymphedema arising from significant soft tissue trauma, like lymphedema originating from other issues, is known to delay wound healing and contribute to infection risk, as well as balance problems. Thus, unchecked lymphedema significantly impacts Pt's with comorbid diabetes and it's complications.    Chronic, progressive lymphedema contributes to functional limitans in all occupational domains, including functional ambulation and mobility, basic and instrumental ADLs, productive activities, leisure pursuits, and social participation. Deformity related to chronic swelling and tissue changes also has a negative impact on body image, self esteem and quality of life. BLE lymphedema contributes to elevated infection risk and increased fall risk.  Ms. Reddix will benefit from skilled OT Complete Decongestive Therapy (CDT) the gold standard of lymphedema care, typically includes manual lymphatic drainage (MLD), skin care to limit' infection risk and increase skin excursion, lymphatic pumping exercise, and during the  Intensive Phase multilayer, gradient compression bandaging to reduce limb volume. The goal of Intensive Phase CDT is to reduce swelling, reduce infection risk, limit progression,  and master  lymphedema self-care. As Pt transitions to the Self Management Phase of CDT Pt will be fit with appropriate compression garments and/ or devices and will complete a trial of the advanced, sequential, pneumatic, Flexitouch compression device for optimal, long term lymphedema management at home. Without skilled OT for lymphedema care, Pt's condition will progress, and further functional decline is expected.  REHAB POTENTIAL: Pt's prognosis for reducing limb volumes and limiting progression of chronic, progressive lymphedema is poor without daily caregiver assistance with compression during the Intensive Phase of CDT. Lymphedema treatment has a high burden of care. Due to spasticity, limited hip AROM, and impaired hand function Ms. Gallina requires daily assistance with application of multilayer, short stretch, knee length, compression bandages  to reduce limb volumes.   With daily caregiver assistance during the Intensive Phase of CDT Pt's prognosis for improvement in her condition is good. With modification and ADL training she may be able to don/ doff and gauge alternative , Velcro,  wrap style , compression leggings .    OBJECTIVE IMPAIRMENTS: Abnormal gait, decreased balance, decreased coordination, decreased knowledge of condition, decreased knowledge of use of DME, decreased mobility, difficulty walking, decreased ROM, increased edema, increased fascial restrictions, increased muscle spasms, impaired flexibility, impaired sensation, impaired tone, impaired UE functional use, impaired vision/preception, postural dysfunction, obesity, pain, and chronic BLE swelling with permanent skin changes and related pain.   ACTIVITY LIMITATIONS: Functional ambulation and mobility ( spasticity and limb heaviness 2/2 lymphedema  limits ability to walk, perform transfers, climb curbs and stairs, carry, squat) basic and instrumental ADLS ( fit she's and lower body clothing, bathe lower body, stand and walk to perform self care and home management activities , productive activities at work, leisure pursuits and social participation in the community.    PERSONAL FACTORS: Age, Fitness, Past/current experiences, 3+ comorbidities: CP, obesity, diabetes, and limited caregiver support are also affecting patient's functional outcome. Lymphedema has a high burden of self-care.  REHAB POTENTIAL: Pt's prognosis for reducing limb volumes and limiting progression of chronic, progressive lymphedema is poor without daily caregiver assistance with compression during  the Intensive Phase of CDT. Lymphedema treatment has a high burden of care. Due to spasticity, limited hip AROM, and impaired hand function Ms. Christoffersen requires daily assistance with application of multilayer, short stretch, knee length, compression bandages  to reduce limb volumes.   With daily caregiver assistance during the Intensive Phase of CDT Pt's prognosis for improvement in her condition is good. With modification and ADL training she may be able to don/ doff and gauge alternative , Velcro,  wrap style , compression leggings .    EVALUATION COMPLEXITY: Moderate  SHORT TERM GOALS: Target date: 4th OT Rx visit   Pt will demonstrate understanding of lymphedema precautions and prevention strategies with moderate assistance using a printed reference to identify at least 5 precautions and discussing how s/he may implement them into daily life to reduce risk of progression with extra time. Baseline: Dependent Goal status: MET  2.  Pt will be able to apply multilayer, thigh length, gradient, compression wraps to one leg at a time from toes to groin with max caregiver assist to decrease limb volume, to limit infection risk, and to limit lymphedema progression.  Baseline:  Dependent Goal status: MET  LONG TERM GOALS: Target date: 01/26/25  1.Given this patient's Intake score of TBA % on the Lymphedema Life Impact Scale (LLIS), patient will experience a reduction of at least 5 points in her perceived level of functional impairment resulting from lymphedema to improve functional performance and quality of life (QOL). Baseline: TBA % Goal status: DEFERRED  2.  Pt will achieve at least a 10% volume reduction in BLE to return limbs to typical size and shape, to limit infection risk and LE progression, to decrease pain, to improve function. Baseline: Dependent Goal status: PARTIALLY MET. 12/16/24 achieved dramatic 27.3% volume reduction in R LEG.  3.  Pt will obtain appropriate compression garments/devices and achieve modified independence (extra time + assistive devices) with donning/doffing to optimize limb volume reductions and limit LE progression over time. Baseline: Dependent Goal status: PROGRESSING. Completed R LEG garment measurements today 12/16/24.  4. During Intensive phase CDT, with modified independence, Pt will achieve at least 85% compliance with all adapted lymphedema self-care home program components, including daily skin care, compression wraps and /or garments, simple self MLD and lymphatic pumping therex to habituate LE self care protocol  into ADLs for optimal LE self-management over time. Baseline: Dependent Goal status: MET. Pt has a very supportive family and community of friends who assist her with compression wraps daily and ensure she has transportation to appointments.  5. Pt will be able to don and doff Flexitouch advanced sequential compression device garments correctly and operate device according to therapist determined program within  2 weeks of receiving device and undergoing manufacturer's training.  Baseline: Dependent Goal status:MET for NIMBL  MODIFIED: 12/16/24: Pt will be able to don and doff T[employer]ile [employer] basic NIMBL  sequential, vaso-pneumatic compression device garments and operate device by issue date after extending length of zipper pull and training by rep to ensure access to assistive device for daily, lymphedema self-management at home over time.  PLAN:  OT FREQUENCY: 2x/week  OT DURATION: 12-18  weeks due to bilaterality and severity. We treat one limb at a time only to limit fall risk.  PLANNED INTERVENTIONS: Complete Decongestive Therapy (CDT) to one leg a time , to include 97110-Therapeutic exercises, 97530- Therapeutic activity, 97535- Self Care, [id number]- Manual therapy, Patient/Family education, Manual lymph drainage, Compression bandaging and fitting with accessible, comfortable ,effective , day  and nighttime compression garments, DME instructions, and skin care to reduce infection risk  PLAN FOR NEXT SESSION:  MLD Skin care Compression wrapping Pt and family edu   Zebedee Dec, MS, OTR/L, CLT-LANA 01/01/25 12:44 PM  "

## 2025-01-02 ENCOUNTER — Ambulatory Visit (INDEPENDENT_AMBULATORY_CARE_PROVIDER_SITE_OTHER): Admitting: Podiatry

## 2025-01-02 ENCOUNTER — Encounter: Payer: Self-pay | Admitting: Podiatry

## 2025-01-02 DIAGNOSIS — G809 Cerebral palsy, unspecified: Secondary | ICD-10-CM

## 2025-01-02 DIAGNOSIS — B351 Tinea unguium: Secondary | ICD-10-CM

## 2025-01-02 DIAGNOSIS — M79609 Pain in unspecified limb: Secondary | ICD-10-CM

## 2025-01-02 DIAGNOSIS — E119 Type 2 diabetes mellitus without complications: Secondary | ICD-10-CM

## 2025-01-02 NOTE — Progress Notes (Signed)
 This patient returns to my office for at risk foot care.  This patient requires this care by a professional since this patient will be at risk due to having diabetes.  This patient is unable to cut nails herself since the patient cannot reach her nails.These nails are painful walking and wearing shoes.  She presents to the office with a history cellulitis and lymphedema right leg/foot.  She is presentsly is cast boot and surgical shoe.  She presents to the office with her mother.  This patient presents for at risk foot care today.    General Appearance  Alert, conversant and in no acute stress.  Vascular  Dorsalis pedis and posterior tibial  pulses are weakly  palpable  due to swelling  bilaterally.  Capillary return is within normal limits  bilaterally. Temperature is within normal limits  Bilaterally. Swelling feet/legs  B/L.  Neurologic  Senn-Weinstein monofilament wire test within normal limits  bilaterally. Muscle power within normal limits bilaterally.  Nails Thick disfigured discolored nails with subungual debris  from hallux to fifth toes bilaterally. No evidence of bacterial infection or drainage bilaterally.  Orthopedic  No limitations of motion  feet .  No crepitus or effusions noted.  No bony pathology or digital deformities noted.  Skin  normotropic skin with no porokeratosis noted bilaterally.  No signs of infections or ulcers noted.   Red swollen feet  B/L.  Onychomycosis  Pain in right toes  Pain in left toes  Consent was obtained for treatment procedures.   Mechanical debridement of nails 1-5  bilaterally performed with a nail nipper.  Patient requests no dremel tool usage.  Mother questions treatment of her fungus nails.  Discussed conservative vs. Surgical treatment for the removal of nails  permanently. RTC 3 months    Return office visit 12  weeks                  Told patient to return for periodic foot care and evaluation due to potential at risk complications.   Cordella Bold DPM

## 2025-01-06 ENCOUNTER — Ambulatory Visit: Admitting: Occupational Therapy

## 2025-01-08 ENCOUNTER — Encounter: Admitting: Registered"

## 2025-01-08 ENCOUNTER — Encounter: Payer: Self-pay | Admitting: Occupational Therapy

## 2025-01-08 ENCOUNTER — Ambulatory Visit: Admitting: Occupational Therapy

## 2025-01-08 DIAGNOSIS — I890 Lymphedema, not elsewhere classified: Secondary | ICD-10-CM

## 2025-01-08 NOTE — Therapy (Signed)
 " OUTPATIENT OCCUPATIONAL THERAPY TREATMENT NOTE   RIGHT LOWER EXTREMITY/  RIGHT LOWER QUADRANT  LYMPHEDEMA  Patient Name: Danielle Aguilar MRN: [id number] DOB:Nov 08, 1986, 39 y.o., female Today's Date: 01/08/2025  REPORTING PERIOD:   END OF SESSION:  OT End of Session - 01/08/25 1020     Visit Number 15    Number of Visits 36    Date for Recertification  01/26/25    OT Start Time 0900    OT Stop Time 1015    OT Time Calculation (min) 75 min    Activity Tolerance Patient tolerated treatment well;No increased pain;Other (comment)   CP with R hemiplegia   Behavior During Therapy WFL for tasks assessed/performed            Past Medical History:  Diagnosis Date   Diabetes mellitus without complication (HCC)    History reviewed. No pertinent surgical history. Patient Active Problem List   Diagnosis Date Noted   Allergic rhinitis 02/23/2022   Cerebral palsy (HCC) 02/23/2022   Chronic kidney disease, stage 1 02/23/2022   Diabetic renal disease (HCC) 02/23/2022   Gastro-esophageal reflux disease without esophagitis 02/23/2022   Hemiplegia of dominant side (HCC) 02/23/2022   Morbid obesity (HCC) 02/23/2022   Pure hypercholesterolemia 02/23/2022   Type 2 diabetes mellitus with diabetic nephropathy (HCC) 02/23/2022   Uncontrolled type 2 diabetes mellitus with hyperglycemia (HCC) 08/14/2018    PCP: Danielle Barrow, MD  REFERRING PROVIDER: same  REFERRING DIAG: I89.0  THERAPY DIAG:  Lymphedema, not elsewhere classified  Rationale for Evaluation and Treatment: Rehabilitation  ONSET DATE: several years ago. No known precipitating event  SUBJECTIVE:                                                                                                                                                                                           SUBJECTIVE STATEMENT: Danielle Aguilar presents to OT for lymphedema care to bilateral legs, with  RLE/RLQ> L. Pt is accompanied by her mother today.  Pt  reports LE related leg pain is 0/10. She reports she had no problem managing lymphedema wraps during interval with assistance from family and friends.Pt brings her new NIMBL basic pneumatic compression device to clinic for instructions on set up and use.  (10/28/24 Initial OT Eval: Danielle Aguilar is referred to Occupational Therapy by Danielle Skeeter, MD, for evaluation and treatment of BLE lymphedema. Pt. Is accompanied by her mother, Danielle Aguilar. Pt ambulates to clinic with ataxic gait using extra time. She reports the pain, redness, and swelling in her right leg and foot worsened a lot during a recent first-ever episode of RLE cellulitis,  which  required oral antibiotics Keflex and doxycycline. Since the cellulitis ~ 2 months ago Pt applies skin lotion ( brand unknown) 2 x daily, and applies a large gob of Vaseline at night with clean calf-length socks on top. Pt denies lymphorrhea. Pt states she has had leg swelling for a long time. She does not know exactly when it started. )  PERTINENT HISTORY:  Chronic venous stasis dermatitis,  10/03/24 BLE Doppler ruled out DVT Uncontrolled DM Type 2 CP w R hemiplegia Myopia Diabetic renal disease Obesity (BMI 43.1) BLE Lymphedema Recent cellulitis R Leg  PAIN:  Are you having pain? Not rated numerically; heavy, itchy, aching Decreases: unknown Increases: standing, walking, sitting  PRECAUTIONS: Fall and Other: LYMPHEDEMA: skin precautions 2/2 DM  RED FLAGS: None   WEIGHT BEARING RESTRICTIONS: No  FALLS:  Has patient fallen in last 6 months? No  LIVING ENVIRONMENT: Lives with: lives alone Lives in: House/apartment Stairs: none Has following equipment at home: None  OCCUPATION: Works part time at [employer]- requires standing and walking  EDUCATION: HS  LEISURE: friends and family time  HAND DOMINANCE: left   PRIOR LEVEL OF FUNCTION: extra time for functional ambulation and transfers, needs AD for self care  PATIENT GOALS: get swelling  down and limit recurrence of cellulitis  OBJECTIVE: Moderate, BLE, Stage  II Lymphedema 2/2 suspected CVI and Obesity, R>L  Note: Objective measures were completed at Evaluation unless otherwise noted.  COGNITION:  Some difficulty with medical hx. Asks excellent questions. Fully participates in eval with appropriate affect. Processing speed slightly delayed.  OBSERVATIONS / OTHER ASSESSMENTS:  POSTURE: R hemiplegia; spasticity  LE ROM: N/T due to spasticity.  Limited ability bend to over to reach feet and distal legs with hands   LYMPHEDEMA ASSESSMENTS:   SURGERY TYPE/DATE: N/A  NUMBER OF LYMPH NODES REMOVED: N/A  CHEMOTHERAPY: N/A  RADIATION:N/A  HORMONE TREATMENT: N/A  INFECTIONS: s/p R leg / foot cellulitis . Rx with oral Keflex and doxycycline   BLE COMPARATIVE LIMB VOLUMETRICS INITIAL 11/04/24  LANDMARK RIGHT    R LEG (A-D) 6737.9 ml  R THIGH (E-G) ml  R FULL LIMB (A-G) ml  Limb Volume differential (LVD)  LVD of LEGS measures 18%, L>R. Typically limb volume differential favors the dominant limb by 3-4 %.  Volume change since initial %  Volume change overall V  (Blank rows = not tested)  LANDMARK LEFT  (L functionally dominant 2/2 R hemiplegia)  L LEG (A-D) 5527.9 ml  L THIGH (E-G) ml  L FULL LIMB (A-G) ml  Limb Volume differential (LVD)  %  Volume change since initial %  Volume change overall %  (Blank rows = not tested)     RLE COMPARATIVE LIMB VOLUMETRICS 10 th visit PROGRESS REPORT 12/16/24  LANDMARK RIGHT    R LEG (A-D) 4916. 2 ml  R THIGH (E-G) ml  R FULL LIMB (A-G) ml  Limb Volume differential (LVD)    Volume change since initial measurements on 11/04/24 T LEG (AD) volume is reduced by 27.3 % since initially measured on 11/04/24.  Volume change overall V    SKIN CONDITION/TISSUE INTEGRITY:  Skin  Description Hyper- Keratosis Peau d d'Orange Shiny Tight Fibrotic/ Indurated Fatty Doughy Spongy/ boggy    Fibrotic skin thickening x  x x        Skin dry Flaky WNL Macerated Crusty Exudate   x        Color Redness Varicosities Blanching Hemosiderin Stain Mottled   x  x  x   Odor Malodorous Yeast Fungal infection  WNL      x   Temperature Warm Cool wnl     x    Pitting Edema   1+ 2+ 3+ 4+ Non-pitting         x   Girth Symmetrical Asymmetrical                   Distribution    R>L toes to groin on R; toes to popliteal on L    Stemmer Sign Positive Negative   +    Lymphorrhea History Of:  Present Absent   x      Wounds History Of Present Absent Venous Arterial Pressure Non Pressure Ulcer   x  Resolved R posterior leg wound        Signs of Infection Redness Warmth Erythema Acute  Swelling Drainage Borders   x                 Sensation Light Touch Deep pressure Hypersensitivity   In tact Impaired In tact Impaired Absent Impaired   Difficult to assess    x     Nails WNL   Fungus nail dystrophy     Severe, discolored    Hair Growth Symmetrical Asymmetrical   x    Skin Creases Base of toes  Ankles   Base of Fingers knees       Abdominal pannus Thigh Lobules  Face/neck   x          GAIT: Distance walked: ataxic gait Assistive device utilized: None Level of assistance: Modified independence Comments: achilles sx as a child  LYMPHEDEMA LIFE IMPACT SCALE (LLIS): DEFERRED (The extent to which LE-related problems interfered with your life  over the last week)                                                                                                                            TREATMENT this DATE:  Pt and family edu re LE self care Compression wrapping as established to R LEG  PATIENT/ FAMILY EDUCATION:  Pt/ family education for basic and advanced sequential pneumatic compression devices, or pump, including clinical indications, how they work, how the basic device differs from the advanced, clinical indications, contraindications and precautions and care and use routines for  pneumatic compression devices. Patient/ caregiver's questions were answered throughout trial and handouts and Internet resources given for reference.  PRECAUTIONS: Pt education re precautions related to use of advanced sequential pneumatic compression device. Pt verbalized understanding that she should never use device on 2 arms/legs simultaneously and should not use device 2 x on same day to avoid overloading her heart with fluid volume return both at present, and in years to come should her medical condition change. Pt instructed to remove device immediately should she experience atypical SOP, light headedness, or acute pain. Pt instructed to discontinue pump if she suspects, or has any infection, blood clot, cellulitis, the  flu, corona virus, etc.  LYMPHEDEMA SELF-CARE HOME PROGRAM: Fit Pt with appropriate day and night time compression garments and devices that meet severity lymphedema and occupational demands.  Fit Pt with quantity 3 for R LEG and L for L LEG, custom, BLE, knee length, flat knit, ELVAREX SOFT, ccl 2 ( 25-32 mmHg) with oblique top edge, slat open toe, T heel and silicone top band to control swelling, to limit infection risk non-healing wounds, to limit lymphedema progression, and to limit further functional decline.   Fit Pt with bilateral, knee length, custom, ccl 1 ( 18-21 mmHg) Jobst RELAX convoluted HOS devices with ZIPPERS to facilitate optimal lymphatic flow during HOS and to limit fibrosis formation.  Donning and doffing aides are medically necessary to assist Pt with hemiplegia and limited R arm and hand function to access compression garments and devices as independently as possible. Pt lives alone and does not have readily available assistance on a daily basis. Medically necessary assistive devices include  1 Mediven stocking BUTLER OFF,  1 Mediven stocking BUTLER ON Frame, quantity 1 Arion Easy Slide donning aide for open toe, and quantity 1 pair Jobst friction Donning  Gloves size small.  Appropriate day and night time compression garments and devices must be matched with severity of lymphedema and must meet occupational demands. Custom-made gradient compression garments and HOS devices are medically necessary because they are uniquely sized and shaped to fit the exact dimensions of the affected extremities, and to provide appropriate medical grade, graduated compression essential for optimally managing chronic, progressive lymphedema. Multiple custom compression garments are needed to ensure proper hygiene to limit infection risk. Custom compression garments should be replaced q 3-6 months When worn consistently for optimal lymphedema self-management over time. HOS devices, medically necessary to limit fibrosis buildup in tissue, should be replaced q 2 years and PRN when worn out.    BLE lymphatic pumping there ex- 1 set of 10 each element, in order. Hold 5. 2 x daily  Daily, short stretch, thigh or knee length, multilayer compression bandages to one leg at a time during Intensive Phase CDT  4. Pt will benefit from a daily use of a basic, or an advanced, sequential, pneumatic , compression device for optimal LE self management of LE at home over time. Pt would most benefit from the [employer] Flexitouch advanced device, but she lives alone and may have difficulty don and doff the garments associated with this device without max assistance. Instead Pt should be fit with the basic [employer] NIMBL device, which is accessible for her. She'll trial the Flexitouch 4 weeks later for comparison.  5. Daily simple self MLD used in conjunction with pneumatic device to engage thoracic duct return and to stimulate the terminus LNs in subclavian region.  6. Daily skin inspection and skin care with low ph lotion matching skin ph to reduce infection risk.  7. Leg elevation when seated    ASSESSMENT:  CLINICAL IMPRESSION: After skilled teaching Pt and mother able to  set up NIMBL device, don and doff NIMBL garments, and operate the device for nearly an entire cycle. Using her L hand only Pt able to connect connectors between garment and device, plug in ac adapter, don and doff R full lower extremity garment and pull zipper pull loop to close and open. Pt able to toggle between medium and low pressure and to turn device on and off, as well as pause. Pt verbalized understanding of precautions we reviewed again, and OT reviewed  device cleaning instructions. Re-Applied multilayer wraps as established. Once RLE garment fitting is complete commence Intensive Phase CDT to L LEG. Pt continues to make steady progress towards all OT goals. Cont as per POC.   (12/23/24 T[employer] NIMBL trial: Danielle Aguilar presents with moderate, stage II, BLE/BLQ lymphedema 2/2 CVI and exacerbated by obesity and hemiplegia.  Ms Mol commenced Occupational Therapy for conservative Complete Decongestive Therapy on 10/28/24 and has undergone  more than 4 weeks CDT 2 x weekly  to the RLE. Treatment includes manual lymphatic drainage (MLD), skin care, therapeutic exercise, compression, elevation, and extensive patient education for lymphedema self-care. Pt continues to present with signs and symptoms of lymphedema, including painful bilateral lower extremity swelling, lower abdominal swelling, and palpable tissue fibrosis, which limits functional ambulation and mobility, ADLs, social participation, leisure pursuits, productive activities and quality of life (body image).  This morning Ms. Farquharson underwent a 45-minute trial of the basic, T[employer]ile [employer] NIMBL, 8 chambered , sequential ( distal to proximal) vaso-pneumatic compression pump on the RLE directing high protein tissue congestion towards the regional inguinal lymph nodes . Although the basic vaso-pneumatic pump does not stimulate  the lymphatic anatomy of the trunk and abdomen,  or address tissue fibrosis, it can be challenging to don and  doff for a person with hemiplegia, so we started with the NIMBL today.  Pt tolerated the NIMBL device without increased pain with limb volume reductions measured in the R knee and thigh. The calf and ankle measurements were unchanged. A sequential, pneumatic, compression device  is medically necessary to limit infection risk, to assist with wound healing, to limit lymphedema progression, and to enable increased independence with lymphedema self-management over time at home. Pt will trial the advanced, 2-piece, Flexitouch device in 4 weeks for comparison.)  (12/18/24 10 th Visit Progress NOTE: Pt demonstrates excellent progress towards all OT goals for lymphedema care to date. Her progress surpasses this therapist's expectations . She demonstrates diligent compliance and has a very loyal and supportive group of family and friends who continue to assist her with compression wrapping every day. Comparative limb volumetrics of the R treatment LEG today reveal a 27.3% reduction in limb volume since commencing OT for CDT. The reduction meets and significantly exceeds the 10% limb volume reduction goal. Cellulitis infection and open , weeping wound on the R posterior leg is resolved. Skin is clean, well hydrated and pliable. Hemosiderin stain persists, but erythema has resolved and limb without signs/ symptoms of infection. Pt is habituating daily BLE skin inspection and care without verbal cues. Please review detailed progress in GOALS section below.  We refined and completed specifications and anatomical measurements for custom, R LEG compression garment and HOS device today. Pt and family educated reprocess of working with DME vendor to [employer] benefits, and educated re fitting and reordering process.    Applied multilayer wraps as established. Once RLE garment fitting is complete commence Intensive Phase CDT to L LEG. T[employer] trial with NIMBL device scheduled for Monday, 12/23/24 at 11 AM.Cont as  per POC. Pt continues to demonstrate excellent progress towards all OT goals for le management.)   (11/04/24 INITIAL OT EVAL: Danielle Aguilar is a 39 yo female presenting with moderate, stage II, BLE/BLQ lymphedema secondary to suspected venous insufficiency, obesity and trauma resulting from cellulitis. Chronic, progressive limb swelling and associated pain is distributed from toes to groin on the R and below the knee on the left. Lymphedema occurs secondary to trauma when  the injury damages lymphatic vessels and structures in the area, leading to a buildup of protein-rich, extra-cellular fluid carrying cellular debris, waste products, bacteria, viruses, and damaged or abnormal cells in the tissues causing swelling, inflammation and pain in the affected body part. Secondary lymphedema arising from significant soft tissue trauma, like lymphedema originating from other issues, is known to delay wound healing and contribute to infection risk, as well as balance problems. Thus, unchecked lymphedema significantly impacts Pt's with comorbid diabetes and it's complications.    Chronic, progressive lymphedema contributes to functional limitans in all occupational domains, including functional ambulation and mobility, basic and instrumental ADLs, productive activities, leisure pursuits, and social participation. Deformity related to chronic swelling and tissue changes also has a negative impact on body image, self esteem and quality of life. BLE lymphedema contributes to elevated infection risk and increased fall risk.  Ms. Ferg will benefit from skilled OT Complete Decongestive Therapy (CDT) the gold standard of lymphedema care, typically includes manual lymphatic drainage (MLD), skin care to limit' infection risk and increase skin excursion, lymphatic pumping exercise, and during the Intensive Phase multilayer, gradient compression bandaging to reduce limb volume. The goal of Intensive Phase CDT is to reduce  swelling, reduce infection risk, limit progression,  and master  lymphedema self-care. As Pt transitions to the Self Management Phase of CDT Pt will be fit with appropriate compression garments and/ or devices and will complete a trial of the advanced, sequential, pneumatic, Flexitouch compression device for optimal, long term lymphedema management at home. Without skilled OT for lymphedema care, Pt's condition will progress, and further functional decline is expected.  REHAB POTENTIAL: Pt's prognosis for reducing limb volumes and limiting progression of chronic, progressive lymphedema is poor without daily caregiver assistance with compression during the Intensive Phase of CDT. Lymphedema treatment has a high burden of care. Due to spasticity, limited hip AROM, and impaired hand function Ms. Rocco requires daily assistance with application of multilayer, short stretch, knee length, compression bandages  to reduce limb volumes.   With daily caregiver assistance during the Intensive Phase of CDT Pt's prognosis for improvement in her condition is good. With modification and ADL training she may be able to don/ doff and gauge alternative , Velcro,  wrap style , compression leggings .    OBJECTIVE IMPAIRMENTS: Abnormal gait, decreased balance, decreased coordination, decreased knowledge of condition, decreased knowledge of use of DME, decreased mobility, difficulty walking, decreased ROM, increased edema, increased fascial restrictions, increased muscle spasms, impaired flexibility, impaired sensation, impaired tone, impaired UE functional use, impaired vision/preception, postural dysfunction, obesity, pain, and chronic BLE swelling with permanent skin changes and related pain.   ACTIVITY LIMITATIONS: Functional ambulation and mobility ( spasticity and limb heaviness 2/2 lymphedema limits ability to walk, perform transfers, climb curbs and stairs, carry, squat) basic and instrumental ADLS ( fit she's and lower  body clothing, bathe lower body, stand and walk to perform self care and home management activities , productive activities at work, leisure pursuits and social participation in the community.    PERSONAL FACTORS: Age, Fitness, Past/current experiences, 3+ comorbidities: CP, obesity, diabetes, and limited caregiver support are also affecting patient's functional outcome. Lymphedema has a high burden of self-care.  REHAB POTENTIAL: Pt's prognosis for reducing limb volumes and limiting progression of chronic, progressive lymphedema is poor without daily caregiver assistance with compression during the Intensive Phase of CDT. Lymphedema treatment has a high burden of care. Due to spasticity, limited hip AROM, and impaired hand function Ms. Mandell  requires daily assistance with application of multilayer, short stretch, knee length, compression bandages  to reduce limb volumes.   With daily caregiver assistance during the Intensive Phase of CDT Pt's prognosis for improvement in her condition is good. With modification and ADL training she may be able to don/ doff and gauge alternative , Velcro,  wrap style , compression leggings .    EVALUATION COMPLEXITY: Moderate  SHORT TERM GOALS: Target date: 4th OT Rx visit   Pt will demonstrate understanding of lymphedema precautions and prevention strategies with moderate assistance using a printed reference to identify at least 5 precautions and discussing how s/he may implement them into daily life to reduce risk of progression with extra time. Baseline: Dependent Goal status: MET  2.  Pt will be able to apply multilayer, thigh length, gradient, compression wraps to one leg at a time from toes to groin with max caregiver assist to decrease limb volume, to limit infection risk, and to limit lymphedema progression.  Baseline: Dependent Goal status: MET  LONG TERM GOALS: Target date: 01/26/25  1.Given this patient's Intake score of TBA % on the Lymphedema Life  Impact Scale (LLIS), patient will experience a reduction of at least 5 points in her perceived level of functional impairment resulting from lymphedema to improve functional performance and quality of life (QOL). Baseline: TBA % Goal status: DEFERRED  2.  Pt will achieve at least a 10% volume reduction in BLE to return limbs to typical size and shape, to limit infection risk and LE progression, to decrease pain, to improve function. Baseline: Dependent Goal status: PARTIALLY MET. 12/16/24 achieved dramatic 27.3% volume reduction in R LEG.  3.  Pt will obtain appropriate compression garments/devices and achieve modified independence (extra time + assistive devices) with donning/doffing to optimize limb volume reductions and limit LE progression over time. Baseline: Dependent Goal status: PROGRESSING. Completed R LEG garment measurements today 12/16/24.  4. During Intensive phase CDT, with modified independence, Pt will achieve at least 85% compliance with all adapted lymphedema self-care home program components, including daily skin care, compression wraps and /or garments, simple self MLD and lymphatic pumping therex to habituate LE self care protocol  into ADLs for optimal LE self-management over time. Baseline: Dependent Goal status: MET. Pt has a very supportive family and community of friends who assist her with compression wraps daily and ensure she has transportation to appointments.  5. Pt will be able to don and doff Flexitouch advanced sequential compression device garments correctly and operate device according to therapist determined program within  2 weeks of receiving device and undergoing manufacturer's training.  Baseline: Dependent Goal status:MET for NIMBL  MODIFIED: 12/16/24: Pt will be able to don and doff [employer] basic NIMBL sequential, vaso-pneumatic compression device garments and operate device by issue date after extending length of zipper pull and training by rep to  ensure access to assistive device for daily, lymphedema self-management at home over time.  PLAN:  OT FREQUENCY: 2x/week  OT DURATION: 12-18  weeks due to bilaterality and severity. We treat one limb at a time only to limit fall risk.  PLANNED INTERVENTIONS: Complete Decongestive Therapy (CDT) to one leg a time , to include 97110-Therapeutic exercises, 97530- Therapeutic activity, 97535- Self Care, [id number]- Manual therapy, Patient/Family education, Manual lymph drainage, Compression bandaging and fitting with accessible, comfortable ,effective , day and nighttime compression garments, DME instructions, and skin care to reduce infection risk  PLAN FOR NEXT SESSION:  MLD Skin care Compression wrapping Pt  and family edu   Zebedee Dec, MS, OTR/L, CLT-LANA 01/08/25 10:39 AM  "

## 2025-01-13 ENCOUNTER — Ambulatory Visit: Admitting: Occupational Therapy

## 2025-01-15 ENCOUNTER — Ambulatory Visit: Admitting: Occupational Therapy

## 2025-01-16 ENCOUNTER — Ambulatory Visit: Admitting: Occupational Therapy

## 2025-01-20 ENCOUNTER — Ambulatory Visit: Admitting: Occupational Therapy

## 2025-01-21 ENCOUNTER — Encounter: Admitting: Registered"

## 2025-01-22 ENCOUNTER — Ambulatory Visit: Admitting: Occupational Therapy

## 2025-01-27 ENCOUNTER — Ambulatory Visit: Admitting: Occupational Therapy

## 2025-01-29 ENCOUNTER — Ambulatory Visit: Admitting: Occupational Therapy

## 2025-02-03 ENCOUNTER — Ambulatory Visit: Admitting: Occupational Therapy

## 2025-02-05 ENCOUNTER — Ambulatory Visit: Admitting: Occupational Therapy

## 2025-02-06 ENCOUNTER — Ambulatory Visit: Admitting: Occupational Therapy

## 2025-02-10 ENCOUNTER — Ambulatory Visit: Admitting: Occupational Therapy

## 2025-02-12 ENCOUNTER — Ambulatory Visit: Admitting: Occupational Therapy

## 2025-02-17 ENCOUNTER — Ambulatory Visit: Admitting: Occupational Therapy

## 2025-02-19 ENCOUNTER — Ambulatory Visit: Admitting: Occupational Therapy

## 2025-02-24 ENCOUNTER — Ambulatory Visit: Admitting: Occupational Therapy

## 2025-02-26 ENCOUNTER — Ambulatory Visit: Admitting: Occupational Therapy

## 2025-03-03 ENCOUNTER — Ambulatory Visit: Admitting: Occupational Therapy

## 2025-03-05 ENCOUNTER — Ambulatory Visit: Admitting: Occupational Therapy

## 2025-03-10 ENCOUNTER — Ambulatory Visit: Admitting: Occupational Therapy

## 2025-03-12 ENCOUNTER — Ambulatory Visit: Admitting: Occupational Therapy

## 2025-03-17 ENCOUNTER — Ambulatory Visit: Admitting: Occupational Therapy

## 2025-03-20 ENCOUNTER — Ambulatory Visit: Admitting: Occupational Therapy

## 2025-03-24 ENCOUNTER — Ambulatory Visit: Admitting: Occupational Therapy

## 2025-03-27 ENCOUNTER — Ambulatory Visit: Admitting: Occupational Therapy

## 2025-03-31 ENCOUNTER — Ambulatory Visit: Admitting: Occupational Therapy

## 2025-04-02 ENCOUNTER — Ambulatory Visit: Admitting: Podiatry

## 2025-04-03 ENCOUNTER — Ambulatory Visit: Admitting: Occupational Therapy

## 2025-04-07 ENCOUNTER — Ambulatory Visit: Admitting: Occupational Therapy

## 2025-04-08 ENCOUNTER — Ambulatory Visit: Admitting: Podiatry

## 2025-04-10 ENCOUNTER — Ambulatory Visit: Admitting: Occupational Therapy

## 2025-04-14 ENCOUNTER — Ambulatory Visit: Admitting: Occupational Therapy

## 2025-04-17 ENCOUNTER — Ambulatory Visit: Admitting: Occupational Therapy

## 2025-04-21 ENCOUNTER — Ambulatory Visit: Admitting: Occupational Therapy

## 2025-04-24 ENCOUNTER — Ambulatory Visit: Admitting: Occupational Therapy

## 2025-05-01 ENCOUNTER — Ambulatory Visit: Admitting: Occupational Therapy

## 2025-05-05 ENCOUNTER — Ambulatory Visit: Admitting: Occupational Therapy

## 2025-05-08 ENCOUNTER — Ambulatory Visit: Admitting: Occupational Therapy

## 2025-05-12 ENCOUNTER — Ambulatory Visit: Admitting: Occupational Therapy

## 2025-05-15 ENCOUNTER — Ambulatory Visit: Admitting: Occupational Therapy

## 2025-05-19 ENCOUNTER — Ambulatory Visit: Admitting: Occupational Therapy

## 2025-05-22 ENCOUNTER — Ambulatory Visit: Admitting: Occupational Therapy

## 2025-05-26 ENCOUNTER — Ambulatory Visit: Admitting: Occupational Therapy

## 2025-05-29 ENCOUNTER — Ambulatory Visit: Admitting: Occupational Therapy

## 2025-06-02 ENCOUNTER — Ambulatory Visit: Admitting: Occupational Therapy

## 2025-06-05 ENCOUNTER — Ambulatory Visit: Admitting: Occupational Therapy

## 2025-06-09 ENCOUNTER — Ambulatory Visit: Admitting: Occupational Therapy

## 2025-06-12 ENCOUNTER — Ambulatory Visit: Admitting: Occupational Therapy

## 2025-06-16 ENCOUNTER — Ambulatory Visit: Admitting: Occupational Therapy

## 2025-06-19 ENCOUNTER — Ambulatory Visit: Admitting: Occupational Therapy

## 2025-06-23 ENCOUNTER — Ambulatory Visit: Admitting: Occupational Therapy

## 2025-06-26 ENCOUNTER — Ambulatory Visit: Admitting: Occupational Therapy

## 2025-06-30 ENCOUNTER — Ambulatory Visit: Admitting: Occupational Therapy

## 2025-07-03 ENCOUNTER — Ambulatory Visit: Admitting: Occupational Therapy

## 2025-07-07 ENCOUNTER — Ambulatory Visit: Admitting: Occupational Therapy

## 2025-07-10 ENCOUNTER — Ambulatory Visit: Admitting: Occupational Therapy

## 2025-07-14 ENCOUNTER — Ambulatory Visit: Admitting: Occupational Therapy

## 2025-07-17 ENCOUNTER — Ambulatory Visit: Admitting: Occupational Therapy

## 2025-07-21 ENCOUNTER — Ambulatory Visit: Admitting: Occupational Therapy

## 2025-07-28 ENCOUNTER — Ambulatory Visit: Admitting: Occupational Therapy
